# Patient Record
Sex: Female | Born: 1975
Health system: Southern US, Community
[De-identification: ages and names within clinical notes are randomized; demographics above are authoritative.]

## PROBLEM LIST (undated history)

## (undated) DIAGNOSIS — J320 Chronic maxillary sinusitis: Secondary | ICD-10-CM

## (undated) DIAGNOSIS — K219 Gastro-esophageal reflux disease without esophagitis: Secondary | ICD-10-CM

## (undated) DIAGNOSIS — Z973 Presence of spectacles and contact lenses: Secondary | ICD-10-CM

## (undated) DIAGNOSIS — R7303 Prediabetes: Secondary | ICD-10-CM

## (undated) DIAGNOSIS — K589 Irritable bowel syndrome without diarrhea: Secondary | ICD-10-CM

## (undated) DIAGNOSIS — G5602 Carpal tunnel syndrome, left upper limb: Secondary | ICD-10-CM

## (undated) DIAGNOSIS — F419 Anxiety disorder, unspecified: Secondary | ICD-10-CM

## (undated) DIAGNOSIS — Z794 Long term (current) use of insulin: Secondary | ICD-10-CM

## (undated) DIAGNOSIS — Z8489 Family history of other specified conditions: Secondary | ICD-10-CM

## (undated) DIAGNOSIS — R519 Headache, unspecified: Secondary | ICD-10-CM

## (undated) DIAGNOSIS — F329 Major depressive disorder, single episode, unspecified: Secondary | ICD-10-CM

## (undated) DIAGNOSIS — N3946 Mixed incontinence: Secondary | ICD-10-CM

## (undated) DIAGNOSIS — Z8719 Personal history of other diseases of the digestive system: Secondary | ICD-10-CM

## (undated) DIAGNOSIS — E785 Hyperlipidemia, unspecified: Secondary | ICD-10-CM

## (undated) DIAGNOSIS — F32A Depression, unspecified: Secondary | ICD-10-CM

## (undated) DIAGNOSIS — J302 Other seasonal allergic rhinitis: Secondary | ICD-10-CM

## (undated) DIAGNOSIS — C801 Malignant (primary) neoplasm, unspecified: Secondary | ICD-10-CM

## (undated) DIAGNOSIS — S42202A Unspecified fracture of upper end of left humerus, initial encounter for closed fracture: Secondary | ICD-10-CM

## (undated) DIAGNOSIS — I10 Essential (primary) hypertension: Secondary | ICD-10-CM

## (undated) DIAGNOSIS — M1990 Unspecified osteoarthritis, unspecified site: Secondary | ICD-10-CM

## (undated) DIAGNOSIS — E119 Type 2 diabetes mellitus without complications: Secondary | ICD-10-CM

## (undated) HISTORY — DX: Anxiety disorder, unspecified: F41.9

## (undated) HISTORY — DX: Gastro-esophageal reflux disease without esophagitis: K21.9

## (undated) HISTORY — PX: ESOPHAGOGASTRODUODENOSCOPY (EGD) WITH PROPOFOL: SHX5813

## (undated) HISTORY — DX: Essential (primary) hypertension: I10

## (undated) HISTORY — PX: LAPAROSCOPIC CHOLECYSTECTOMY: SUR755

## (undated) HISTORY — PX: ORIF PATELLA FRACTURE: SUR947

## (undated) HISTORY — PX: COLONOSCOPY WITH PROPOFOL: SHX5780

## (undated) HISTORY — PX: CHOLECYSTECTOMY: SHX55

## (undated) HISTORY — PX: WISDOM TOOTH EXTRACTION: SHX21

---

## 2001-03-07 ENCOUNTER — Other Ambulatory Visit: Admission: RE | Admit: 2001-03-07 | Discharge: 2001-03-07 | Payer: Self-pay | Admitting: Obstetrics and Gynecology

## 2007-07-23 ENCOUNTER — Ambulatory Visit (HOSPITAL_COMMUNITY): Admission: RE | Admit: 2007-07-23 | Discharge: 2007-07-23 | Payer: Self-pay | Admitting: Family Medicine

## 2007-07-25 ENCOUNTER — Encounter (HOSPITAL_COMMUNITY): Admission: RE | Admit: 2007-07-25 | Discharge: 2007-08-24 | Payer: Self-pay | Admitting: Family Medicine

## 2007-09-10 ENCOUNTER — Encounter (INDEPENDENT_AMBULATORY_CARE_PROVIDER_SITE_OTHER): Payer: Self-pay | Admitting: General Surgery

## 2007-09-10 ENCOUNTER — Ambulatory Visit (HOSPITAL_COMMUNITY): Admission: RE | Admit: 2007-09-10 | Discharge: 2007-09-10 | Payer: Self-pay | Admitting: General Surgery

## 2007-09-10 HISTORY — PX: CHOLECYSTECTOMY: SHX55

## 2008-06-24 ENCOUNTER — Ambulatory Visit: Payer: Self-pay | Admitting: Internal Medicine

## 2009-08-29 HISTORY — PX: ORIF PATELLA FRACTURE: SUR947

## 2009-10-16 ENCOUNTER — Emergency Department (HOSPITAL_COMMUNITY): Admission: EM | Admit: 2009-10-16 | Discharge: 2009-10-16 | Payer: Self-pay | Admitting: Emergency Medicine

## 2009-12-02 ENCOUNTER — Encounter (HOSPITAL_COMMUNITY): Admission: RE | Admit: 2009-12-02 | Discharge: 2010-01-01 | Payer: Self-pay | Admitting: Orthopedic Surgery

## 2010-03-22 ENCOUNTER — Ambulatory Visit (HOSPITAL_COMMUNITY): Admission: RE | Admit: 2010-03-22 | Discharge: 2010-03-22 | Payer: Self-pay | Admitting: Family Medicine

## 2010-05-24 ENCOUNTER — Ambulatory Visit (HOSPITAL_COMMUNITY): Admission: RE | Admit: 2010-05-24 | Discharge: 2010-05-24 | Payer: Self-pay | Admitting: Family Medicine

## 2010-05-31 ENCOUNTER — Ambulatory Visit (HOSPITAL_COMMUNITY): Admission: RE | Admit: 2010-05-31 | Discharge: 2010-05-31 | Payer: Self-pay | Admitting: Family Medicine

## 2010-09-19 ENCOUNTER — Encounter: Payer: Self-pay | Admitting: Family Medicine

## 2011-01-11 NOTE — Op Note (Signed)
Emily Braun, Emily Braun              ACCOUNT NO.:  [id number]   MEDICAL RECORD NO.:  [id number]          PATIENT TYPE:  AMB   LOCATION:  SDS                          FACILITY:  [facility]   PHYSICIAN:  Gabrielle Dare. Janee Morn, M.D.DATE OF BIRTH:  [birth year]   DATE OF PROCEDURE:  09/10/2007  DATE OF DISCHARGE:  09/10/2007                               OPERATIVE REPORT   PREOPERATIVE DIAGNOSIS:  1. Symptomatic cholelithiasis.  2. Biliary dyskinesia.   POSTOPERATIVE DIAGNOSIS:  1. Symptomatic cholelithiasis.  2. Biliary dyskinesia.   PROCEDURE:  Laparoscopic cholecystectomy.   SURGEON:  Gabrielle Dare. Janee Morn, M.D.   ASSISTANT:  Ollen Gross. Vernell Morgans, M.D.   ANESTHESIA:  General.   INDICATIONS FOR PROCEDURE:  The patient is a 35 year old white female  who I evaluated in the office for epigastric abdominal pain.  Her workup  by Dr. Gerda Diss included ultrasound showing gallstones and HIDA scan  demonstrating severe biliary dyskinesia.  She presents today for  elective cholecystectomy.   DESCRIPTION OF PROCEDURE:  Informed consent was obtained.  The patient  received intravenous antibiotics.  She was identified in the  preoperative holding area.  She was brought to the operating room.  General endotracheal anesthesia was administered by the anesthesia  staff.  Her abdomen was prepped and draped in the usual sterile fashion.  Infraumbilical region was infiltrated with 0.25% Marcaine with  epinephrine.  Infraumbilical incision was made.  Subcutaneous tissues  were dissected down revealing anterior fascia.  Anterior fascia was  divided.  The peritoneal cavity was then entered under direct vision  with careful dissection.  A 0 Vicryl pursestring suture was placed  around the fascial opening.  The Hasson trocar was inserted into the  abdomen and the abdomen was insufflated with carbon dioxide in a  standard fashion under direct vision.  An 11 mm epigastric and two 5 mm  lateral ports were placed.  0.25%  Marcaine with epinephrine was used at  all port sites.  The dome of the gallbladder was retracted superior  medially.  The infundibulum was retracted inferior laterally.  The  gallbladder contained several stones.  Dissection began laterally and  progressed medially easily identifying the cystic duct as well as a  small artery running right next to the cystic duct.  This small artery  was dissected free, clipped once proximally, and divided distally with  cautery.  Further dissection was carried around on the cystic duct until  a large window was created between the cystic duct infundibulum and the  liver.  Once this was done with excellent visualization, a clip was  placed on the infundibulocystic duct junction.  A small nick was made in  the cystic duct and we attempted to insert a Reddick cholangiogram  catheter.  Despite multiple attempts we were unable to insert the  Reddick.  The diameter of the cystic duct seemed approximately  equivalent with the tip of the Reddick catheter and we were not able to  insert it.  Bile flowed back easily and the visualization was excellent.  The patient's liver function tests are normal, so we terminated further  attempts at the cholangiogram.  The three clips were placed on the  proximal and cystic duct and it was divided.  Further dissection  revealed anterior and posterior branches of the cystic artery.  These  were both clipped twice proximally and once distally and divided.  The  gallbladder was taken off the liver bed with Bovie cautery achieving  excellent hemostasis along the way.  We did encounter one other small  arterial branch about halfway up the body of the gallbladder.  This was  clipped twice proximally, once distally, and divided.  The gallbladder  was taken off of the liver bed with the Bovie cautery and placed in an  EndoCatch bag.  It was removed from the abdomen via the infraumbilical  port site.  The liver bed was copiously  irrigated and meticulous  hemostasis was obtained with Bovie cautery.  The clips all remained in  excellent position and the liver bed was dry.  The remainder of the  irrigation fluid was evacuated and it was dry.  The liver bed was  rechecked one more time.  Clips remained in good position and everything  remained dry.  The lateral ports were then removed under direct vision.  The infraumbilical fascia was closed under direct vision with the camera  by tying the 0 Vicryl pursestring suture.  The pneumoperitoneum was  released.  The epigastric port was removed.  All four wounds were  copiously irrigated.  Hemostasis was ensured using Bovie cautery.  The  infraumbilical and epigastric sites were closed with running 4-0 Vicryl  subcuticular stitch and then all four wounds were closed with Dermabond.  Needle, sponge, and instrument counts correct.  The patient tolerated  the procedure well without apparent complications and was taken to the  recovery room in stable condition.      Gabrielle Dare Janee Morn, M.D.  Electronically Signed     BET/MEDQ  D:  09/10/2007  T:  09/10/2007  Job:  [id number]   cc:   Lorin Picket A. Gerda Diss, MD

## 2011-01-11 NOTE — Consult Note (Signed)
Emily Braun, Emily Braun              ACCOUNT NO.:  [id number]   MEDICAL RECORD NO.:  [id number]          PATIENT TYPE:  AMB   LOCATION:  DAY                           FACILITY:  [facility]   PHYSICIAN:  Kassie Mends, M.D.      DATE OF BIRTH:  10-28-1975   DATE OF CONSULTATION:  06/24/2008  DATE OF DISCHARGE:                                 CONSULTATION   REASON FOR CONSULTATION:  Blood in the stool and rectal itching.   PHYSICIAN REQUESTING CONSULTATION:  W. Simone Curia, MD   HISTORY OF PRESENT ILLNESS:  The patient is a very pleasant 35 year old  Caucasian female who presents today for further evaluation of chronic  intermittent hematochezia.  She also has chronic constipation with  intermittent diarrhea as well as rectal irritation.  She states that she  has been seeing bright red blood per rectum primarily on the toilet  tissue for a long time.  She sometimes goes 7 days without a bowel  movement.  Then, she may have 2-3 days in a row with loose stools.  She  really denies any rectal pain, abdominal pain, nausea, or vomiting.  She  has chronic heartburn, controlled with ranitidine.  She denies any  unintentional weight loss.   CURRENT MEDICATIONS:  1. Ranitidine 300 mg daily.  2. Ortho-Tri-Cyclen daily.  3. Tylenol p.r.n.   ALLERGIES:  No known drug allergies.   PAST MEDICAL HISTORY:  GERD.   PAST SURGICAL HISTORY:  She had a cholecystectomy for cholelithiasis in  January 2009.   FAMILY HISTORY:  Father has diabetes.  Both mother and father have  hypertension.  Paternal uncle died with throat cancer.   SOCIAL HISTORY:  She is single.  She is employed with [employer].  She  is a nonsmoker.  Rarely consumes alcohol.   REVIEW OF SYSTEMS:  GI:  See HPI.  CONSTITUTIONAL:  Denies any weight  loss.  CARDIOPULMONARY:  Denies any chest pain, shortness of breath,  palpitations, or cough.  GENITOURINARY:  Denies any dysuria or  hematuria.   PHYSICAL EXAMINATION:  VITAL SIGNS:   Weight 172, height 5 feet 2-1/2  inches, temp 97.9, blood pressure 142/88, and pulse 80.  GENERAL:  Pleasant, moderately obese Caucasian female in no acute  distress.  SKIN:  Warm and dry.  No jaundice.  HEENT:  Sclerae nonicteric.  Oropharyngeal mucosa moist and pink.  No  lesions, erythema, or exudate.  No lymphadenopathy or thyromegaly.  CHEST:  Lungs are clear to auscultation.  CARDIAC:  Regular rate and rhythm.  Normal S1 and S2.  No murmurs, rubs,  or gallops.  ABDOMEN:  Positive bowel sounds.  Abdomen is soft, nontender, and  nondistended.  No organomegaly or masses.  No rebound or guarding.  No  abdominal bruits or hernias.  LOWER EXTREMITIES:  No edema.   LABORATORY DATA:  From May 27, 2008, CBC normal.  She had a  digital rectal exam, which was reported as negative through Dr. Fletcher Anon  office as well.   IMPRESSION:  The patient is a very pleasant 35 year old lady with  chronic constipation with  intermittent diarrhea, chronic intermittent  hematochezia, and rectal irritation.  I suspect, she has irritable bowel  syndrome with possible benign anorectal bleeding from hemorrhoids.  Cannot exclude the possibility of inflammatory bowel disease or  colorectal cancer, however.  Would recommend colonoscopy for further  evaluation.  I have discussed risks, alternatives, and benefits with the  patient with regards to, but not limited to the risk of reaction to  medication, bleeding, infection, and perforation.  She is agreeable to  proceed.   PLAN:  1. Colonoscopy with Dr. Cira Servant in the near future.  2. MiraLax 17 g daily as needed for constipation, #7 samples provided.  3. I would like to thank Dr. Gerda Diss for allowing [place] to take part in      the care of this patient.      [name], P.A.      Kassie Mends, M.D.  Electronically Signed    LL/MEDQ  D:  06/24/2008  T:  06/25/2008  Job:  [id number]   cc:   Donna Bernard, M.D.  Fax: [phone]

## 2011-05-19 LAB — DIFFERENTIAL
Basophils Absolute: 0
Basophils Relative: 0
Eosinophils Absolute: 0.2
Eosinophils Relative: 3
Lymphocytes Relative: 21
Lymphs Abs: 1.3
Monocytes Absolute: 0.4
Monocytes Relative: 6
Neutro Abs: 4.1
Neutrophils Relative %: 69

## 2011-05-19 LAB — HCG, SERUM, QUALITATIVE: Preg, Serum: NEGATIVE

## 2011-05-19 LAB — COMPREHENSIVE METABOLIC PANEL
ALT: 20
AST: 24
Albumin: 3.9
Alkaline Phosphatase: 51
BUN: 10
CO2: 27
Calcium: 9.6
Chloride: 104
Creatinine, Ser: 0.54
GFR calc Af Amer: >60
GFR calc non Af Amer: >60
Glucose, Bld: 106 — ABNORMAL HIGH
Potassium: 4.4
Sodium: 138
Total Bilirubin: 0.4
Total Protein: 7.1

## 2011-05-19 LAB — CBC
HCT: 40.9
Hemoglobin: 13.9
MCHC: 34
MCV: 88.1
Platelets: 341
RBC: 4.64
RDW: 12
WBC: 5.9

## 2012-12-12 ENCOUNTER — Encounter: Payer: Self-pay | Admitting: Family Medicine

## 2012-12-12 ENCOUNTER — Ambulatory Visit (INDEPENDENT_AMBULATORY_CARE_PROVIDER_SITE_OTHER): Payer: BC Managed Care – PPO | Admitting: Family Medicine

## 2012-12-12 VITALS — BP 140/92 | Temp 98.3°F | Ht 63.0 in | Wt 194.4 lb

## 2012-12-12 DIAGNOSIS — J322 Chronic ethmoidal sinusitis: Secondary | ICD-10-CM

## 2012-12-12 MED ORDER — CEFPROZIL 500 MG PO TABS: 500.0000 mg | ORAL_TABLET | Freq: Two times a day (BID) | ORAL | Status: DC

## 2012-12-12 NOTE — Progress Notes (Signed)
  Subjective:    Patient ID: Emily Braun, female    DOB: 03-Aug-1976, 37 y.o.   MRN: [id number]  Sinusitis This is a new problem. The current episode started in the past 7 days. The problem has been rapidly worsening since onset. Her pain is at a severity of 6/10. The pain is moderate. Associated symptoms include congestion, coughing, headaches, a hoarse voice and sinus pressure. Pertinent negatives include no chills. Past treatments include nothing. The treatment provided mild relief.  Headache  Associated symptoms include coughing and sinus pressure.      Review of Systems  Constitutional: Negative for chills.  HENT: Positive for congestion, hoarse voice and sinus pressure.   Respiratory: Positive for cough.   Neurological: Positive for headaches.       Objective:   Physical Exam  Alert. No acute distress. Vitals reviewed. Positive nasal congestion. Frontal and maxillary tenderness. Neck supple. No adenopathy. Lungs clear. Heart regular in rhythm.      Assessment & Plan:  Impression #1 sinusitis. #2 allergic rhinitis-discussed. Plan Cefzil 500 twice a day 10 days. Maintain allergy medications. Symptomatic care discussed. WSL

## 2012-12-20 ENCOUNTER — Other Ambulatory Visit: Payer: Self-pay | Admitting: Family Medicine

## 2012-12-20 ENCOUNTER — Ambulatory Visit (INDEPENDENT_AMBULATORY_CARE_PROVIDER_SITE_OTHER): Payer: BC Managed Care – PPO | Admitting: Family Medicine

## 2012-12-20 ENCOUNTER — Encounter: Payer: Self-pay | Admitting: Family Medicine

## 2012-12-20 VITALS — BP 122/81 | Temp 98.4°F | Wt 193.8 lb

## 2012-12-20 DIAGNOSIS — K529 Noninfective gastroenteritis and colitis, unspecified: Secondary | ICD-10-CM | POA: Insufficient documentation

## 2012-12-20 DIAGNOSIS — K5289 Other specified noninfective gastroenteritis and colitis: Secondary | ICD-10-CM

## 2012-12-20 MED ORDER — METRONIDAZOLE 500 MG PO TABS: 500.0000 mg | ORAL_TABLET | Freq: Three times a day (TID) | ORAL | Status: DC

## 2012-12-20 NOTE — Progress Notes (Signed)
  Subjective:    Patient ID: Emily Braun, female    DOB: 1975/12/01, 37 y.o.   MRN: [id number]  HPI This patient was recently on Cefzil for sinus infection toward the end of it she started noticing soreness pain and discomfort in the left lower artery and then she started noticing loose stools and recently the loose stools have had blood mixed in she states several years ago she was told she ought to have a colonoscopy but did not do it because she didn't have insurance at that time she had blood in her stools no family history of colon cancer. Family history social history all reviewed.   Review of Systemsshe denies fevers vomiting sweats chills     Objective:   Physical Exam Vital signs stable lungs are clear hearts regular moderate left lower quadrant tenderness no guarding or rebound extremities no edema skin warm dry neurologic grossly normal       Assessment & Plan:  Colitis-antibiotic induced  Probable. I doubt colon cancer but several years ago she was advised to get a colonoscopy and did not do because of insurance now with this being the second occurrence of bleeding I believe it is wise for her to go ahead and be seen by gastroenterology for colonoscopy. We will go ahead and treat appropriately to cover for the possibility of C. Difficile toxin/C. Difficile colitis. Warning signs were discussed with her. Flagyl 3 times a day for 7 days, stool testing, probiotic daily for the next 6 weeks. Referral to GI.

## 2012-12-20 NOTE — Patient Instructions (Signed)
[facility] Colon [facility] - one daily for 6 weeks  If high fevers/worse be rechecked right away

## 2012-12-21 ENCOUNTER — Encounter: Payer: Self-pay | Admitting: Gastroenterology

## 2012-12-21 ENCOUNTER — Other Ambulatory Visit: Payer: Self-pay | Admitting: Family Medicine

## 2012-12-21 LAB — CBC WITH DIFFERENTIAL/PLATELET
Eosinophils Relative: 3 % (ref 0–5)
HCT: 41.4 % (ref 36.0–46.0)
Hemoglobin: 14 g/dL (ref 12.0–15.0)
Lymphocytes Relative: 29 % (ref 12–46)
Lymphs Abs: 3 10*3/uL (ref 0.7–4.0)
MCH: 29.2 pg (ref 26.0–34.0)
MCV: 86.4 fL (ref 78.0–100.0)
Monocytes Absolute: 0.7 10*3/uL (ref 0.1–1.0)
Monocytes Relative: 7 % (ref 3–12)
Neutro Abs: 6.4 10*3/uL (ref 1.7–7.7)
Neutrophils Relative %: 60 % (ref 43–77)
Platelets: 381 10*3/uL (ref 150–400)
RBC: 4.79 MIL/uL (ref 3.87–5.11)
RDW: 13.1 % (ref 11.5–15.5)
WBC: 10.4 10*3/uL (ref 4.0–10.5)

## 2012-12-22 LAB — FECAL LACTOFERRIN, QUANT: Lactoferrin: POSITIVE

## 2012-12-25 LAB — STOOL CULTURE

## 2012-12-26 LAB — CLOSTRIDIUM DIFFICILE TOXIN

## 2012-12-26 LAB — CLOSTRIDIUM DIFFICILE BY PCR: Toxigenic C. Difficile by PCR: NOT DETECTED

## 2013-01-04 ENCOUNTER — Encounter: Payer: Self-pay | Admitting: Family Medicine

## 2013-01-09 ENCOUNTER — Encounter: Payer: Self-pay | Admitting: *Deleted

## 2013-01-10 ENCOUNTER — Ambulatory Visit (INDEPENDENT_AMBULATORY_CARE_PROVIDER_SITE_OTHER): Payer: BC Managed Care – PPO | Admitting: Gastroenterology

## 2013-01-10 ENCOUNTER — Other Ambulatory Visit: Payer: BC Managed Care – PPO

## 2013-01-10 ENCOUNTER — Encounter: Payer: Self-pay | Admitting: Gastroenterology

## 2013-01-10 VITALS — BP 110/80 | HR 75 | Ht 62.8 in | Wt 194.0 lb

## 2013-01-10 DIAGNOSIS — R109 Unspecified abdominal pain: Secondary | ICD-10-CM

## 2013-01-10 DIAGNOSIS — K625 Hemorrhage of anus and rectum: Secondary | ICD-10-CM

## 2013-01-10 DIAGNOSIS — R110 Nausea: Secondary | ICD-10-CM

## 2013-01-10 DIAGNOSIS — K589 Irritable bowel syndrome without diarrhea: Secondary | ICD-10-CM

## 2013-01-10 DIAGNOSIS — R197 Diarrhea, unspecified: Secondary | ICD-10-CM

## 2013-01-10 MED ORDER — MOVIPREP 100 G PO SOLR: 1.0000 | Freq: Once | ORAL | Status: DC

## 2013-01-10 NOTE — Patient Instructions (Addendum)
You have been scheduled for a colonoscopy with propofol. Please follow written instructions given to you at your visit today.  Please pick up your prep kit at the pharmacy within the next 1-3 days. If you use inhalers (even only as needed), please bring them with you on the day of your procedure. Your physician has requested that you go to www.startemmi.com and enter the access code given to you at your visit today. This web site gives a general overview about your procedure. However, you should still follow specific instructions given to you by our office regarding your preparation for the procedure.  Your physician has requested that you go to the basement for the following lab work before leaving today: Celiac Panel   Information on Irritable  Bowel Syndrome is below.  FOD MAP Diet was given today for your information. ___________________________________________________________________________________________________________________________________________  Irritable Bowel Syndrome Irritable Bowel Syndrome (IBS) is caused by a disturbance of normal bowel function. Other terms used are spastic colon, mucous colitis, and irritable colon. It does not require surgery, nor does it lead to cancer. There is no cure for IBS. But with proper diet, stress reduction, and medication, you will find that your problems (symptoms) will gradually disappear or improve. IBS is a common digestive disorder. It usually appears in late adolescence or early adulthood. Women develop it twice as often as men. CAUSES  After food has been digested and absorbed in the small intestine, waste material is moved into the colon (large intestine). In the colon, water and salts are absorbed from the undigested products coming from the small intestine. The remaining residue, or fecal material, is held for elimination. Under normal circumstances, gentle, rhythmic contractions on the bowel walls push the fecal material along the colon  towards the rectum. In IBS, however, these contractions are irregular and poorly coordinated. The fecal material is either retained too long, resulting in constipation, or expelled too soon, producing diarrhea. SYMPTOMS  The most common symptom of IBS is pain. It is typically in the lower left side of the belly (abdomen). But it may occur anywhere in the abdomen. It can be felt as heartburn, backache, or even as a dull pain in the arms or shoulders. The pain comes from excessive bowel-muscle spasms and from the buildup of gas and fecal material in the colon. This pain:  Can range from sharp belly (abdominal) cramps to a dull, continuous ache.  Usually worsens soon after eating.  Is typically relieved by having a bowel movement or passing gas. Abdominal pain is usually accompanied by constipation. But it may also produce diarrhea. The diarrhea typically occurs right after a meal or upon arising in the morning. The stools are typically soft and watery. They are often flecked with secretions (mucus). Other symptoms of IBS include:  Bloating.  Loss of appetite.  Heartburn.  Feeling sick to your stomach (nausea).  Belching  Vomiting  Gas. IBS may also cause a number of symptoms that are unrelated to the digestive system:  Fatigue.  Headaches.  Anxiety  Shortness of breath  Difficulty in concentrating.  Dizziness. These symptoms tend to come and go. DIAGNOSIS  The symptoms of IBS closely mimic the symptoms of other, more serious digestive disorders. So your caregiver may wish to perform a variety of additional tests to exclude these disorders. He/she wants to be certain of learning what is wrong (diagnosis). The nature and purpose of each test will be explained to you. TREATMENT A number of medications are available to help  correct bowel function and/or relieve bowel spasms and abdominal pain. Among the drugs available are:  Mild, non-irritating laxatives for severe constipation  and to help restore normal bowel habits.  Specific anti-diarrheal medications to treat severe or prolonged diarrhea.  Anti-spasmodic agents to relieve intestinal cramps.  Your caregiver may also decide to treat you with a mild tranquilizer or sedative during unusually stressful periods in your life. The important thing to remember is that if any drug is prescribed for you, make sure that you take it exactly as directed. Make sure that your caregiver knows how well it worked for you. HOME CARE INSTRUCTIONS   Avoid foods that are high in fat or oils. Some examples ZOX:WRUEA cream, butter, frankfurters, sausage, and other fatty meats.  Avoid foods that have a laxative effect, such as fruit, fruit juice, and dairy products.  Cut out carbonated drinks, chewing gum, and "gassy" foods, such as beans and cabbage. This may help relieve bloating and belching.  Bran taken with plenty of liquids may help relieve constipation.  Keep track of what foods seem to trigger your symptoms.  Avoid emotionally charged situations or circumstances that produce anxiety.  Start or continue exercising.  Get plenty of rest and sleep. MAKE SURE YOU:   Understand these instructions.  Will watch your condition.  Will get help right away if you are not doing well or get worse. Document Released: 08/15/2005 Document Revised: 11/07/2011 Document Reviewed: 04/04/2008 [facility] Patient Information 2013 [place], [place].

## 2013-01-10 NOTE — Progress Notes (Signed)
History of Present Illness:  This is a 37 year old Caucasian female with chronic IBS, one to 2 loose bowel movements a day, who recently had antibiotic induced worsening of her diarrhea, and responded well to by mouth metronidazole by Dr. Gerda Diss .  She is back to usual 1-2 soft bowel movements a day but does have periodic bright red blood per rectum, and relates that she has" hemorrhoids and Preparation H is my friend".  The patient has not had previous colonoscopy or barium studies.  She denies upper GI or hepatobiliary complaints except for occasional GERD managed with Zantac 300 mg.  There is no history of food intolerances, anorexia, weight loss, hepatobiliary or systemic symptoms.  The patient did have laparoscopic cholecystectomy in 2009.  She currently is on probiotics with some improvement of her chronic IBS symptoms.  He denies lactose intolerance, history of sorbitol or fructose use.  I have reviewed this patient's present history, medical and surgical past history, allergies and medications.     ROS:   All systems were reviewed and are negative unless otherwise stated in the HPI.    Physical Exam: Blood pressure 110/80, 75 and regular, and weight 194 the BMI of 34.59. General well developed well nourished patient in no acute distress, appearing their stated age Eyes PERRLA, no icterus, fundoscopic exam per opthamologist Skin no lesions noted Neck supple, no adenopathy, no thyroid enlargement, no tenderness Chest clear to percussion and auscultation Heart no significant murmurs, gallops or rubs noted Abdomen no hepatosplenomegaly masses or tenderness, BS normal.  Rectal inspection normal no fissures, or fistulae noted.  No masses or tenderness on digital exam. Stool guaiac negative. Extremities no acute joint lesions, edema, phlebitis or evidence of cellulitis. Neurologic patient oriented x 3, cranial nerves intact, no focal neurologic deficits noted. Psychological mental status normal  and normal affect.  Assessment and plan: Chronic IBS with periodic rectal bleeding probably from mixed hemorrhoids, rule out colonic polyposis, IBD, or rectal prolapse.  I have placed her on a FOD-MAP diet for diarrhea predominant IBS, we will check celiac profile, and I've scheduled diagnostic colonoscopy at her convenience. Encounter Diagnoses  Name Primary?  . Rectal bleeding Yes  . Nausea alone   . Abdominal pain, unspecified site

## 2013-01-11 ENCOUNTER — Ambulatory Visit: Payer: BC Managed Care – PPO

## 2013-01-16 ENCOUNTER — Ambulatory Visit (INDEPENDENT_AMBULATORY_CARE_PROVIDER_SITE_OTHER): Payer: BC Managed Care – PPO | Admitting: *Deleted

## 2013-01-16 DIAGNOSIS — IMO0001 Reserved for inherently not codable concepts without codable children: Secondary | ICD-10-CM

## 2013-01-16 DIAGNOSIS — Z309 Encounter for contraceptive management, unspecified: Secondary | ICD-10-CM

## 2013-01-16 MED ORDER — MEDROXYPROGESTERONE ACETATE 150 MG/ML IM SUSP
150.0000 mg | Freq: Once | INTRAMUSCULAR | Status: AC
  Administered 2013-01-16: 150 mg via INTRAMUSCULAR

## 2013-01-16 NOTE — Progress Notes (Signed)
  Subjective:    Patient ID: Emily Braun, female    DOB: [birth year], 37 y.o.   MRN: [id number]  HPI    Review of Systems     Objective:   Physical Exam        Assessment & Plan:

## 2013-01-18 ENCOUNTER — Ambulatory Visit: Payer: BC Managed Care – PPO

## 2013-01-19 LAB — CELIAC PANEL 10
Endomysial Screen: NEGATIVE
Gliadin IgA: 9.7 U/mL (ref <20)
Tissue Transglut Ab: 7.3 U/mL (ref <20)

## 2013-02-01 ENCOUNTER — Encounter: Payer: BC Managed Care – PPO | Admitting: Gastroenterology

## 2013-02-22 ENCOUNTER — Ambulatory Visit (AMBULATORY_SURGERY_CENTER): Payer: BC Managed Care – PPO | Admitting: Gastroenterology

## 2013-02-22 ENCOUNTER — Encounter: Payer: Self-pay | Admitting: Gastroenterology

## 2013-02-22 VITALS — BP 106/68 | HR 65 | Temp 98.9°F | Resp 18 | Ht 62.0 in | Wt 194.0 lb

## 2013-02-22 DIAGNOSIS — Z1211 Encounter for screening for malignant neoplasm of colon: Secondary | ICD-10-CM

## 2013-02-22 DIAGNOSIS — R197 Diarrhea, unspecified: Secondary | ICD-10-CM

## 2013-02-22 DIAGNOSIS — K625 Hemorrhage of anus and rectum: Secondary | ICD-10-CM

## 2013-02-22 HISTORY — PX: COLONOSCOPY WITH PROPOFOL: SHX5780

## 2013-02-22 MED ORDER — SODIUM CHLORIDE 0.9 % IV SOLN: 500.0000 mL | INTRAVENOUS | Status: DC

## 2013-02-22 NOTE — Progress Notes (Signed)
Procedure ends, to recovery, report give, VSS

## 2013-02-22 NOTE — Op Note (Signed)
[facility] [address] [place], [address]   COLONOSCOPY PROCEDURE REPORT  PATIENT: Emily Braun, Emily Braun  MR#: [id number] BIRTHDATE: July 09, 1976 , 36  yrs. old GENDER: Female ENDOSCOPIST: Mardella Layman, MD, Clementeen Graham REFERRED BY:  Lilyan Punt, M.D. PROCEDURE DATE:  02/22/2013 PROCEDURE:   Colonoscopy with biopsy ASA CLASS:   Class II INDICATIONS:Average risk patient for colon cancer and hematochezia.  MEDICATIONS: propofol (Diprivan) 250mg  IV  DESCRIPTION OF PROCEDURE:   After the risks and benefits and of the procedure were explained, informed consent was obtained.        The LB [id number]  endoscope was introduced through the anus and advanced to the cecum, which was identified by both the appendix and ileocecal valve .  The quality of the prep was excellent, using MoviPrep .  The instrument was then slowly withdrawn as the colon was fully examined.     COLON FINDINGS: A normal appearing cecum, ileocecal valve, and appendiceal orifice were identified.  The ascending, hepatic flexure, transverse, splenic flexure, descending, sigmoid colon and rectum appeared unremarkable.  No polyps or cancers were seen. Retroflexed views revealed external hemorrhoids.     The scope was then withdrawn from the patient and the procedure completed.  COMPLICATIONS: There were no complications. ENDOSCOPIC IMPRESSION: Normal colon ..random biopsies done,small hemorrhoids noted.  RECOMMENDATIONS: 1.  Await biopsy results 2.  Continue current medications 3.  Continue current colorectal screening recommendations for "routine risk" patients with a repeat colonoscopy in 10 years.   REPEAT EXAM:  cc:  _______________________________ eSignedMardella Layman, MD, [facility] 02/22/2013 2:21 PM

## 2013-02-22 NOTE — Progress Notes (Signed)
Patient did not experience any of the following events: a burn prior to discharge; a fall within the facility; wrong site/side/patient/procedure/implant event; or a hospital transfer or hospital admission upon discharge from the facility. (G8907) Patient did not have preoperative order for IV antibiotic SSI prophylaxis. (G8918)  

## 2013-02-22 NOTE — Progress Notes (Signed)
Called to room to assist during endoscopic procedure.  Patient ID and intended procedure confirmed with present staff. Received instructions for my participation in the procedure from the performing physician.  

## 2013-02-22 NOTE — Patient Instructions (Addendum)
Discharge instructions given with verbal understanding. Handouts on hemorrhoids and a high fiber diet given. Resume previous medications. YOU HAD AN ENDOSCOPIC PROCEDURE TODAY AT THE [facility]: Refer to the procedure report that was given to you for any specific questions about what was found during the examination.  If the procedure report does not answer your questions, please call your gastroenterologist to clarify.  If you requested that your care partner not be given the details of your procedure findings, then the procedure report has been included in a sealed envelope for you to review at your convenience later.  YOU SHOULD EXPECT: Some feelings of bloating in the abdomen. Passage of more gas than usual.  Walking can help get rid of the air that was put into your GI tract during the procedure and reduce the bloating. If you had a lower endoscopy (such as a colonoscopy or flexible sigmoidoscopy) you may notice spotting of blood in your stool or on the toilet paper. If you underwent a bowel prep for your procedure, then you may not have a normal bowel movement for a few days.  DIET: Your first meal following the procedure should be a light meal and then it is ok to progress to your normal diet.  A half-sandwich or bowl of soup is an example of a good first meal.  Heavy or fried foods are harder to digest and may make you feel nauseous or bloated.  Likewise meals heavy in dairy and vegetables can cause extra gas to form and this can also increase the bloating.  Drink plenty of fluids but you should avoid alcoholic beverages for 24 hours.  ACTIVITY: Your care partner should take you home directly after the procedure.  You should plan to take it easy, moving slowly for the rest of the day.  You can resume normal activity the day after the procedure however you should NOT DRIVE or use heavy machinery for 24 hours (because of the sedation medicines used during the test).    SYMPTOMS TO  REPORT IMMEDIATELY: A gastroenterologist can be reached at any hour.  During normal business hours, 8:30 AM to 5:00 PM Monday through Friday, call [phone].  After hours and on weekends, please call the GI answering service at [phone] who will take a message and have the physician on call contact you.   Following lower endoscopy (colonoscopy or flexible sigmoidoscopy):  Excessive amounts of blood in the stool  Significant tenderness or worsening of abdominal pains  Swelling of the abdomen that is new, acute  Fever of 100F or higher FOLLOW UP: If any biopsies were taken you will be contacted by phone or by letter within the next 1-3 weeks.  Call your gastroenterologist if you have not heard about the biopsies in 3 weeks.  Our staff will call the home number listed on your records the next business day following your procedure to check on you and address any questions or concerns that you may have at that time regarding the information given to you following your procedure. This is a courtesy call and so if there is no answer at the home number and we have not heard from you through the emergency physician on call, we will assume that you have returned to your regular daily activities without incident.  SIGNATURES/CONFIDENTIALITY: You and/or your care partner have signed paperwork which will be entered into your electronic medical record.  These signatures attest to the fact that that the information above on your   After Visit Summary has been reviewed and is understood.  Full responsibility of the confidentiality of this discharge information lies with you and/or your care-partner.  

## 2013-02-25 ENCOUNTER — Telehealth: Payer: Self-pay

## 2013-02-25 NOTE — Telephone Encounter (Signed)
  Follow up Call-  Call back number 02/22/2013  Post procedure Call Back phone  # [phone]  Permission to leave phone message Yes     Patient questions:  Do you have a fever, pain , or abdominal swelling? no Pain Score  0 *  Have you tolerated food without any problems? yes  Have you been able to return to your normal activities? yes  Do you have any questions about your discharge instructions: Diet   no Medications  no Follow up visit  no  Do you have questions or concerns about your Care? no  Actions: * If pain score is 4 or above: No action needed, pain <4.

## 2013-02-26 ENCOUNTER — Encounter: Payer: Self-pay | Admitting: Gastroenterology

## 2013-03-14 ENCOUNTER — Other Ambulatory Visit: Payer: Self-pay | Admitting: Family Medicine

## 2013-04-05 ENCOUNTER — Ambulatory Visit: Payer: BC Managed Care – PPO

## 2013-04-09 ENCOUNTER — Ambulatory Visit (INDEPENDENT_AMBULATORY_CARE_PROVIDER_SITE_OTHER): Payer: BC Managed Care – PPO | Admitting: *Deleted

## 2013-04-09 DIAGNOSIS — Z309 Encounter for contraceptive management, unspecified: Secondary | ICD-10-CM

## 2013-04-09 DIAGNOSIS — IMO0001 Reserved for inherently not codable concepts without codable children: Secondary | ICD-10-CM

## 2013-04-09 MED ORDER — MEDROXYPROGESTERONE ACETATE 150 MG/ML IM SUSP
150.0000 mg | Freq: Once | INTRAMUSCULAR | Status: AC
  Administered 2013-04-09: 150 mg via INTRAMUSCULAR

## 2013-04-10 ENCOUNTER — Other Ambulatory Visit: Payer: Self-pay | Admitting: Family Medicine

## 2013-05-06 ENCOUNTER — Other Ambulatory Visit: Payer: Self-pay | Admitting: Nurse Practitioner

## 2013-06-14 ENCOUNTER — Encounter: Payer: Self-pay | Admitting: Family Medicine

## 2013-06-14 ENCOUNTER — Ambulatory Visit (INDEPENDENT_AMBULATORY_CARE_PROVIDER_SITE_OTHER): Payer: BC Managed Care – PPO | Admitting: Family Medicine

## 2013-06-14 VITALS — BP 118/94 | Ht 62.0 in | Wt 196.2 lb

## 2013-06-14 DIAGNOSIS — R300 Dysuria: Secondary | ICD-10-CM

## 2013-06-14 LAB — POCT URINALYSIS DIPSTICK
Protein, UA: 100
Spec Grav, UA: >=1.03

## 2013-06-14 MED ORDER — CIPROFLOXACIN HCL 500 MG PO TABS: 500.0000 mg | ORAL_TABLET | Freq: Two times a day (BID) | ORAL | Status: DC

## 2013-06-14 NOTE — Progress Notes (Signed)
  Subjective:    Patient ID: Emily Braun, female    DOB: April 06, 1976, 37 y.o.   MRN: [id number]  Dysuria  This is a new problem. The current episode started yesterday. The quality of the pain is described as burning. Associated symptoms include frequency.   This patient has had some problems in the past urinary tract infections. There is been no vomiting wheezing difficulty breathing. There is some slight flank discomfort on the right side. None on the left side. No diarrhea. No hematochezia. PMH frequent urinary tract infections in the past. In addition to this has been on Macrobid in the past and has seen Dr. Orvilla Fus of [facility].  Review of Systems  Genitourinary: Positive for dysuria and frequency.       Objective:   Physical Exam  Lungs are clear hearts regular flanks nontender slight tenderness on the right flank region with palpation abdomen soft Urinalysis with wbc's UTI present      Assessment & Plan:  UTI-Cipro 500 mg twice a day over the course of next 5 days. If doing well most likely can stop. Patient last UTI was approximately 3 months ago. She stated that she will probably set up an appointment with urologist. I told her that would be fine. Warning signs were discussed

## 2013-06-19 ENCOUNTER — Other Ambulatory Visit: Payer: Self-pay | Admitting: Family Medicine

## 2013-06-28 ENCOUNTER — Ambulatory Visit: Payer: BC Managed Care – PPO

## 2013-06-28 ENCOUNTER — Encounter: Payer: Self-pay | Admitting: Nurse Practitioner

## 2013-06-28 ENCOUNTER — Ambulatory Visit (INDEPENDENT_AMBULATORY_CARE_PROVIDER_SITE_OTHER): Payer: BC Managed Care – PPO | Admitting: Nurse Practitioner

## 2013-06-28 VITALS — BP 122/78 | Ht 62.0 in | Wt 198.0 lb

## 2013-06-28 DIAGNOSIS — R7301 Impaired fasting glucose: Secondary | ICD-10-CM

## 2013-06-28 DIAGNOSIS — R635 Abnormal weight gain: Secondary | ICD-10-CM

## 2013-06-28 DIAGNOSIS — R638 Other symptoms and signs concerning food and fluid intake: Secondary | ICD-10-CM

## 2013-06-28 DIAGNOSIS — T50905A Adverse effect of unspecified drugs, medicaments and biological substances, initial encounter: Secondary | ICD-10-CM

## 2013-06-28 DIAGNOSIS — I10 Essential (primary) hypertension: Secondary | ICD-10-CM

## 2013-06-28 MED ORDER — LEVONORGEST-ETH ESTRAD 91-DAY 0.15-0.03 &0.01 MG PO TABS: 1.0000 | ORAL_TABLET | Freq: Every day | ORAL | Status: DC

## 2013-06-28 MED ORDER — PHENTERMINE HCL 37.5 MG PO TABS: 37.5000 mg | ORAL_TABLET | Freq: Every day | ORAL | Status: DC

## 2013-07-03 ENCOUNTER — Encounter: Payer: Self-pay | Admitting: Nurse Practitioner

## 2013-07-03 DIAGNOSIS — R7301 Impaired fasting glucose: Secondary | ICD-10-CM | POA: Insufficient documentation

## 2013-07-03 DIAGNOSIS — I10 Essential (primary) hypertension: Secondary | ICD-10-CM | POA: Insufficient documentation

## 2013-07-03 NOTE — Progress Notes (Signed)
Subjective:   Presents for c/o weight gain while on Depo Provera. Her weight was in 160s now 198. Wants to switch to oc's.   Objective:   BP 122/78  Ht 5\' 2"  (1.575 m)  Wt 198 lb (89.812 kg)  BMI 36.21 kg/m2 NAD. Alert, oriented. Lungs clear. Heart RRR. No murmur or gallop noted.   Assessment: Weight gain due to medication  Essential hypertension, benign  Impaired fasting glucose  Plan: Stop Depo Provera. This Sunday, start Seasonique as directed.  Meds ordered this encounter  Medications  . Levonorgestrel-Ethinyl Estradiol (AMETHIA,CAMRESE) 0.15-0.03 &0.01 MG tablet    Sig: Take 1 tablet by mouth daily.    Dispense:  1 Package    Refill:  4    Order Specific Question:  Supervising Provider    Answer:  Merlyn Albert [[id number]]  . phentermine (ADIPEX-P) 37.5 MG tablet    Sig: Take 1 tablet (37.5 mg total) by mouth daily before breakfast.    Dispense:  30 tablet    Refill:  1    Order Specific Question:  Supervising Provider    Answer:  Merlyn Albert [[id number]]   Start Phentermine 1/2-1 tab qd. Stop if BP above 140/90. Followup in December at [facility], sooner if any problems.

## 2013-07-04 ENCOUNTER — Other Ambulatory Visit: Payer: Self-pay

## 2013-07-18 ENCOUNTER — Other Ambulatory Visit: Payer: Self-pay | Admitting: Family Medicine

## 2013-08-09 ENCOUNTER — Encounter: Payer: Self-pay | Admitting: Nurse Practitioner

## 2013-08-09 ENCOUNTER — Ambulatory Visit (INDEPENDENT_AMBULATORY_CARE_PROVIDER_SITE_OTHER): Payer: BC Managed Care – PPO | Admitting: Nurse Practitioner

## 2013-08-09 VITALS — BP 120/72 | Ht 62.0 in | Wt 191.8 lb

## 2013-08-09 DIAGNOSIS — R7301 Impaired fasting glucose: Secondary | ICD-10-CM

## 2013-08-09 DIAGNOSIS — I10 Essential (primary) hypertension: Secondary | ICD-10-CM

## 2013-08-09 DIAGNOSIS — Z01419 Encounter for gynecological examination (general) (routine) without abnormal findings: Secondary | ICD-10-CM

## 2013-08-09 DIAGNOSIS — Z0000 Encounter for general adult medical examination without abnormal findings: Secondary | ICD-10-CM

## 2013-08-09 LAB — BASIC METABOLIC PANEL WITH GFR
BUN: 11 mg/dL (ref 6–23)
CO2: 29 meq/L (ref 19–32)
Calcium: 9.5 mg/dL (ref 8.4–10.5)
Chloride: 101 meq/L (ref 96–112)
Creat: 0.77 mg/dL (ref 0.50–1.10)
Glucose, Bld: 107 mg/dL — ABNORMAL HIGH (ref 70–99)
Potassium: 4.8 meq/L (ref 3.5–5.3)
Sodium: 139 meq/L (ref 135–145)

## 2013-08-09 LAB — LIPID PANEL: VLDL: 9 mg/dL (ref 0–40)

## 2013-08-09 LAB — HEMOGLOBIN A1C
Hgb A1c MFr Bld: 5.6 %
Mean Plasma Glucose: 114 mg/dL

## 2013-08-09 LAB — HEPATIC FUNCTION PANEL
ALT: 19 U/L (ref 0–35)
AST: 27 U/L (ref 0–37)
Indirect Bilirubin: 0.4 mg/dL (ref 0.0–0.9)
Total Bilirubin: 0.5 mg/dL (ref 0.3–1.2)
Total Protein: 7.1 g/dL (ref 6.0–8.3)

## 2013-08-09 MED ORDER — PHENTERMINE HCL 37.5 MG PO TABS: 37.5000 mg | ORAL_TABLET | Freq: Every day | ORAL | Status: DC

## 2013-08-12 ENCOUNTER — Encounter: Payer: Self-pay | Admitting: Nurse Practitioner

## 2013-08-14 ENCOUNTER — Encounter: Payer: Self-pay | Admitting: Nurse Practitioner

## 2013-08-14 NOTE — Progress Notes (Signed)
   Subjective:    Patient ID: Emily Braun, female    DOB: 1976-03-07, 37 y.o.   MRN: [id number]  HPI presents for wellness exam. Since switching to her birth control pills, combined with phentermine has lost 7-8 pounds. Cycles are light and regular lasting 3-4 days. Same sexual partner. No vaginal discharge or pelvic pain. Regular eye exams. Needs a dental exam. Denies any side effects from phentermine.    Review of Systems  Constitutional: Negative for activity change, appetite change and fatigue.  HENT: Positive for rhinorrhea. Negative for dental problem, ear pain, sinus pressure and sore throat.   Eyes: Negative for visual disturbance.  Respiratory: Negative for choking, chest tightness, shortness of breath and wheezing.   Cardiovascular: Negative for chest pain and leg swelling.  Gastrointestinal: Negative for nausea, vomiting, abdominal pain, diarrhea and constipation.  Genitourinary: Negative for dysuria, urgency, frequency, vaginal discharge, difficulty urinating, menstrual problem and pelvic pain.       Objective:   Physical Exam  Vitals reviewed. Constitutional: She is oriented to person, place, and time. She appears well-developed. No distress.  HENT:  Right Ear: External ear normal.  Left Ear: External ear normal.  Mouth/Throat: Oropharynx is clear and moist.  Neck: Normal range of motion. Neck supple. No tracheal deviation present. No thyromegaly present.  Cardiovascular: Normal rate, regular rhythm and normal heart sounds.  Exam reveals no gallop.   No murmur heard. Pulmonary/Chest: Effort normal and breath sounds normal.  Abdominal: Soft. She exhibits no distension. There is no tenderness.  Genitourinary: Vagina normal and uterus normal. No vaginal discharge found.  Musculoskeletal: She exhibits no edema.  Lymphadenopathy:    She has no cervical adenopathy.  Neurological: She is alert and oriented to person, place, and time.  Skin: Skin is warm and dry. No rash  noted.  Psychiatric: She has a normal mood and affect. Her behavior is normal.   Breast exam: No masses noted, axilla no adenopathy. External GU normal. Vagina normal, no discharge noted. Cervix normal limit in appearance, no CMT. Bimanual exam normal, no masses or tenderness noted.        Assessment & Plan:  Well woman exam  Impaired fasting glucose - Plan: Basic metabolic panel, Hemoglobin A1c, Hepatic function panel  Essential hypertension, benign - Plan: Basic metabolic panel, Hepatic function panel  Routine general medical examination at a health care facility - Plan: Hepatic function panel, Lipid panel  Meds ordered this encounter  Medications  . phentermine (ADIPEX-P) 37.5 MG tablet    Sig: Take 1 tablet (37.5 mg total) by mouth daily before breakfast.    Dispense:  30 tablet    Refill:  2    Order Specific Question:  Supervising Provider    Answer:  Merlyn Albert [[id number]]   Continue exercise and weight loss efforts. Recommend daily vitamin D and calcium supplementation. Recheck in 3 months, call back sooner if any problems. Next physical in one year.

## 2013-08-17 ENCOUNTER — Encounter: Payer: Self-pay | Admitting: Nurse Practitioner

## 2013-09-09 ENCOUNTER — Encounter: Payer: Self-pay | Admitting: Family Medicine

## 2013-09-09 ENCOUNTER — Ambulatory Visit: Payer: BC Managed Care – PPO | Admitting: Nurse Practitioner

## 2013-09-09 ENCOUNTER — Ambulatory Visit (INDEPENDENT_AMBULATORY_CARE_PROVIDER_SITE_OTHER): Payer: BC Managed Care – PPO | Admitting: Family Medicine

## 2013-09-09 VITALS — BP 134/94 | Temp 98.4°F | Ht 62.0 in | Wt 191.8 lb

## 2013-09-09 DIAGNOSIS — R300 Dysuria: Secondary | ICD-10-CM

## 2013-09-09 LAB — POCT URINALYSIS DIPSTICK
PH UA: 5
SPEC GRAV UA: 1.02

## 2013-09-09 MED ORDER — CIPROFLOXACIN HCL 500 MG PO TABS: 500.0000 mg | ORAL_TABLET | Freq: Two times a day (BID) | ORAL | Status: DC

## 2013-09-09 NOTE — Progress Notes (Signed)
   Subjective:    Patient ID: Emily Braun, female    DOB: 21-May-1976, 38 y.o.   MRN: [id number]  HPI Comments: She took OTC meds for the cold, and now has a UTI. She said it happens all the time.  Urinary Tract Infection  This is a recurrent problem. The current episode started in the past 7 days. The problem occurs intermittently. The quality of the pain is described as burning. There has been no fever. Associated symptoms include flank pain and hematuria. Associated symptoms comments: Back pain yesterday.   She states she actually had a little bit hematuria 2 separate times last week. She does see a urologist.  Review of Systems  Genitourinary: Positive for hematuria and flank pain.   she has had some urinary frequency     Objective:   Physical Exam Flanks are nontender abdomen is soft urine under microscope looks good       Assessment & Plan:  Cover with Cipro check urine culture if negative referral to urology for cystoscope

## 2013-09-11 LAB — URINE CULTURE: Colony Count: 75000

## 2013-09-20 ENCOUNTER — Encounter: Payer: Self-pay | Admitting: Family Medicine

## 2013-09-20 ENCOUNTER — Ambulatory Visit (INDEPENDENT_AMBULATORY_CARE_PROVIDER_SITE_OTHER): Payer: BC Managed Care – PPO | Admitting: Family Medicine

## 2013-09-20 VITALS — BP 110/80 | Ht 62.0 in | Wt 193.4 lb

## 2013-09-20 DIAGNOSIS — R300 Dysuria: Secondary | ICD-10-CM

## 2013-09-20 LAB — POCT URINALYSIS DIPSTICK
Spec Grav, UA: 1.01
pH, UA: 6

## 2013-09-20 NOTE — Progress Notes (Signed)
   Subjective:    Patient ID: Emily Braun, female    DOB: 1976-04-30, 38 y.o.   MRN: [id number]  HPI Patient is here to have her urine rechecked. States she is having no symptoms. Recently urinalysis showed contaminated urine  Review of Systems Denies hematuria denies dysuria     Objective:   Physical Exam  Lungs clear heart regular  Urinalysis normal no blood cells    Assessment & Plan:  Reculture urine no sign of any current infection

## 2013-09-23 LAB — URINALYSIS, ROUTINE W REFLEX MICROSCOPIC
Bilirubin Urine: NEGATIVE
Glucose, UA: NEGATIVE mg/dL
Hgb urine dipstick: NEGATIVE
Ketones, ur: NEGATIVE mg/dL
Leukocytes, UA: NEGATIVE
NITRITE: NEGATIVE
PH: 6.5 (ref 5.0–8.0)
PROTEIN: NEGATIVE mg/dL
Specific Gravity, Urine: 1.021 (ref 1.005–1.030)
Urobilinogen, UA: 0.2 mg/dL (ref 0.0–1.0)

## 2013-09-27 ENCOUNTER — Ambulatory Visit: Payer: BC Managed Care – PPO | Admitting: Family Medicine

## 2013-10-11 ENCOUNTER — Ambulatory Visit (INDEPENDENT_AMBULATORY_CARE_PROVIDER_SITE_OTHER): Payer: BC Managed Care – PPO | Admitting: Family Medicine

## 2013-10-11 ENCOUNTER — Encounter: Payer: Self-pay | Admitting: Family Medicine

## 2013-10-11 ENCOUNTER — Institutional Professional Consult (permissible substitution): Payer: Self-pay | Admitting: Emergency Medicine

## 2013-10-11 VITALS — BP 128/86 | Temp 98.5°F | Ht 62.0 in | Wt 192.0 lb

## 2013-10-11 DIAGNOSIS — J019 Acute sinusitis, unspecified: Secondary | ICD-10-CM

## 2013-10-11 MED ORDER — CEFPROZIL 500 MG PO TABS: 500.0000 mg | ORAL_TABLET | Freq: Two times a day (BID) | ORAL | Status: AC

## 2013-10-11 NOTE — Progress Notes (Signed)
   Subjective:    Patient ID: Emily Braun, female    DOB: [birth year], 38 y.o.   MRN: [id number]  Sore Throat  This is a new problem. The current episode started yesterday. Associated symptoms include congestion, coughing and ear pain. Pertinent negatives include no shortness of breath. She has tried acetaminophen for the symptoms. The treatment provided mild relief.  has had congestion for several weeksd, using the humidifier Clear to bloody to mucous tint. Symptoms off and on for 2 months    Review of Systems  Constitutional: Negative for fever and activity change.  HENT: Positive for congestion, ear pain and rhinorrhea.   Eyes: Negative for discharge.  Respiratory: Positive for cough. Negative for shortness of breath and wheezing.   Cardiovascular: Negative for chest pain.  heard herself wheeze No fever      Objective:   Physical Exam  Nursing note and vitals reviewed. Constitutional: She appears well-developed.  HENT:  Head: Normocephalic.  Nose: Nose normal.  Mouth/Throat: Oropharynx is clear and moist. No oropharyngeal exudate.  Neck: Neck supple.  Cardiovascular: Normal rate and normal heart sounds.   No murmur heard. Pulmonary/Chest: Effort normal and breath sounds normal. She has no wheezes.  Lymphadenopathy:    She has no cervical adenopathy.  Skin: Skin is warm and dry.          Assessment & Plan:  Patient has some element of chronic sinus congestion and drainage but now over the past few weeks with mucoid drainage I feel it's a little to go ahead and treat with antibiotics. I find no evidence of any pulmonary involvement should gradually get back to her usual self over the next few days if progressive troubles or if worse she needs to notify us. She understands this.

## 2013-10-16 ENCOUNTER — Telehealth: Payer: Self-pay | Admitting: Family Medicine

## 2013-10-16 NOTE — Telephone Encounter (Signed)
Pt put on cefPROZIL (CEFZIL) 500 MG tablet For her sinus infection.  Has a bladder infection now, wants to  Know if she should Cont with the cefzil or switch to the cipro she has?

## 2013-10-16 NOTE — Telephone Encounter (Signed)
May add cipro 500 one bid for 5 days, may stop Cefzil after the 20th

## 2013-10-17 MED ORDER — CIPROFLOXACIN HCL 500 MG PO TABS: 500.0000 mg | ORAL_TABLET | Freq: Two times a day (BID) | ORAL | Status: DC

## 2013-10-17 NOTE — Telephone Encounter (Signed)
Medication sent to pharmacy. Patient was notified.  

## 2013-11-12 ENCOUNTER — Other Ambulatory Visit: Payer: Self-pay | Admitting: Family Medicine

## 2013-11-15 ENCOUNTER — Ambulatory Visit: Payer: BC Managed Care – PPO | Admitting: Nurse Practitioner

## 2013-11-29 ENCOUNTER — Ambulatory Visit (INDEPENDENT_AMBULATORY_CARE_PROVIDER_SITE_OTHER): Payer: BC Managed Care – PPO | Admitting: Nurse Practitioner

## 2013-11-29 ENCOUNTER — Encounter: Payer: Self-pay | Admitting: Nurse Practitioner

## 2013-11-29 VITALS — BP 128/88 | Ht 62.0 in | Wt 195.0 lb

## 2013-11-29 DIAGNOSIS — J0110 Acute frontal sinusitis, unspecified: Secondary | ICD-10-CM

## 2013-11-29 MED ORDER — METHYLPREDNISOLONE ACETATE 40 MG/ML IJ SUSP
40.0000 mg | Freq: Once | INTRAMUSCULAR | Status: AC
  Administered 2013-11-29: 40 mg via INTRAMUSCULAR

## 2013-12-02 ENCOUNTER — Telehealth: Payer: Self-pay | Admitting: Family Medicine

## 2013-12-02 ENCOUNTER — Encounter: Payer: Self-pay | Admitting: Nurse Practitioner

## 2013-12-02 MED ORDER — LEVOFLOXACIN 500 MG PO TABS
500.0000 mg | ORAL_TABLET | Freq: Every day | ORAL | Status: DC
Start: 2013-12-02 — End: 2013-12-27

## 2013-12-02 NOTE — Telephone Encounter (Signed)
May send in prescription for Levaquin one daily for the next 10 days 500 mg. If not well after that followup.

## 2013-12-02 NOTE — Progress Notes (Signed)
Subjective:  Presents for recheck. Over the past several weeks has had sinus congestion. Now having frontal area headache. Clear to slightly green mucus. What feels like your drainage from the right ear. No ear pain. No sore throat. Rare cough. No wheezing. Currently taking Allegra and Flonase for her symptoms. Has about a week's worth of Cefzil at home.  Objective:   BP 128/88  Ht 5\' 2"  (1.575 m)  Wt 195 lb (88.451 kg)  BMI 35.66 kg/m2 NAD. Alert, oriented. TMs significant clear effusion, no erythema. No drainage or perforation noted in the right ear. Pharynx injected with PND noted. Neck supple with mild soft interior adenopathy. Lungs clear. Heart regular rate rhythm.  Assessment:Acute frontal sinusitis - Plan: methylPREDNISolone acetate (DEPO-MEDROL) injection 40 mg  Plan: Meds ordered this encounter  Medications  . methylPREDNISolone acetate (DEPO-MEDROL) injection 40 mg    Sig:    restart Cefzil as directed. Call back next week if no improvement, sooner if worse.

## 2013-12-02 NOTE — Telephone Encounter (Signed)
Seen on 11/29/13. Office note not ready. Diagnosis acute frontal sinusitis. No meds were sent in for pt. she Received depo medrol shot and pt told carolyn she had cefprozil at home and carolyn told pt to use that. Still having stuffy nose, ear pain, headache, No fever.

## 2013-12-02 NOTE — Telephone Encounter (Signed)
Patient was told to call back today if she was feeling no better than she was on Friday. She is still having all the same symptoms she was on Friday with not much relief. Please advise.   [facility] 

## 2013-12-02 NOTE — Telephone Encounter (Signed)
Med sent to pharm. Pt notified.  

## 2013-12-09 ENCOUNTER — Telehealth: Payer: Self-pay | Admitting: Family Medicine

## 2013-12-09 NOTE — Telephone Encounter (Signed)
Patient is still having stuffyness and muffles in her ear. She said Emily Braun had mentioned a steroid if she wasn't feeling any better. Please advise.  [facility] [place]

## 2013-12-10 ENCOUNTER — Other Ambulatory Visit: Payer: Self-pay | Admitting: Nurse Practitioner

## 2013-12-10 MED ORDER — PREDNISONE 20 MG PO TABS: ORAL_TABLET | ORAL | Status: DC

## 2013-12-24 ENCOUNTER — Telehealth: Payer: Self-pay | Admitting: Family Medicine

## 2013-12-24 MED ORDER — FLUTICASONE PROPIONATE 50 MCG/ACT NA SUSP: 2.0000 | Freq: Every day | NASAL | Status: DC

## 2013-12-24 MED ORDER — AMOXICILLIN-POT CLAVULANATE 875-125 MG PO TABS: 1.0000 | ORAL_TABLET | Freq: Two times a day (BID) | ORAL | Status: DC

## 2013-12-24 NOTE — Telephone Encounter (Signed)
Patient notified and verbalized understanding. 

## 2013-12-24 NOTE — Telephone Encounter (Signed)
Seen on 11/29/13. Diagnosis acute frontal sinusitis.  She received depo medrol shot and pt told carolyn she had cefprozil at home and carolyn told pt to use that. Still having stuffy nose, ear pain, headache, No fever. Was given Levaquin and just finished Prednisone taper. Still having same symptoms. Wants to know what the next steps are.

## 2013-12-24 NOTE — Telephone Encounter (Signed)
flonase 2 sprays each nare daily, 6 rf. Also augmentin 875 bid 10 days with snack f/u if ongoing

## 2013-12-24 NOTE — Addendum Note (Signed)
Addended byCharolotte Capuchin D on: 12/24/2013 10:04 AM   Modules accepted: Orders

## 2013-12-27 ENCOUNTER — Encounter: Payer: Self-pay | Admitting: Family Medicine

## 2013-12-27 ENCOUNTER — Ambulatory Visit (INDEPENDENT_AMBULATORY_CARE_PROVIDER_SITE_OTHER): Payer: BC Managed Care – PPO | Admitting: Family Medicine

## 2013-12-27 VITALS — BP 110/80 | Temp 98.0°F | Ht 62.0 in | Wt 197.2 lb

## 2013-12-27 DIAGNOSIS — J310 Chronic rhinitis: Secondary | ICD-10-CM

## 2013-12-27 DIAGNOSIS — J329 Chronic sinusitis, unspecified: Secondary | ICD-10-CM

## 2013-12-27 MED ORDER — AMOXICILLIN-POT CLAVULANATE 875-125 MG PO TABS: 1.0000 | ORAL_TABLET | Freq: Two times a day (BID) | ORAL | Status: DC

## 2013-12-27 NOTE — Progress Notes (Signed)
   Subjective:    Patient ID: Emily Braun, female    DOB: [birth year], 38 y.o.   MRN: [id number]  Sinusitis This is a recurrent problem. The current episode started more than 1 month ago. The problem has been gradually worsening since onset. There has been no fever. Her pain is at a severity of 5/10. The pain is moderate. Associated symptoms include congestion, coughing, sinus pressure and a sore throat. Past treatments include antibiotics. The treatment provided no relief.  Patient states she has no other concerns at this time.   cef didn't help, took round of lev and pred, Started on stickw with allergy meds  Bad max pain and frontal pain  Throat very sore  Mouth breathing a lot,  Pain with swallwoing  Usually rough spring with bronchitis as a child but none recently  No sig allergy  Was on gen claritin and now back to instead of allegra  Review of Systems  HENT: Positive for congestion, sinus pressure and sore throat.   Respiratory: Positive for cough.    No vomiting or diarrhea    Objective:   Physical Exam    Alert moderate malaise   lungs clear. Heart regular in rhythm. H&T frontal and maxillary tenderness. Assessment & Plan:  Impression 1 acute rhinosinusitis #2 allergic rhinitis plan Augmentin twice a day 10 days. Symptomatic care discussed. Warning signs discussed. WSL

## 2014-01-09 ENCOUNTER — Encounter: Payer: Self-pay | Admitting: Family Medicine

## 2014-01-09 ENCOUNTER — Ambulatory Visit (INDEPENDENT_AMBULATORY_CARE_PROVIDER_SITE_OTHER): Payer: BC Managed Care – PPO | Admitting: Family Medicine

## 2014-01-09 VITALS — BP 110/78 | Temp 98.2°F | Ht 62.0 in | Wt 200.0 lb

## 2014-01-09 DIAGNOSIS — N9489 Other specified conditions associated with female genital organs and menstrual cycle: Secondary | ICD-10-CM

## 2014-01-09 DIAGNOSIS — N949 Unspecified condition associated with female genital organs and menstrual cycle: Secondary | ICD-10-CM

## 2014-01-09 DIAGNOSIS — J019 Acute sinusitis, unspecified: Secondary | ICD-10-CM

## 2014-01-09 LAB — POCT URINALYSIS DIPSTICK
Spec Grav, UA: 1.02
pH, UA: 6

## 2014-01-09 MED ORDER — FLUCONAZOLE 150 MG PO TABS: ORAL_TABLET | ORAL | Status: DC

## 2014-01-09 MED ORDER — CEFDINIR 300 MG PO CAPS: 300.0000 mg | ORAL_CAPSULE | Freq: Two times a day (BID) | ORAL | Status: DC

## 2014-01-09 NOTE — Progress Notes (Signed)
   Subjective:    Patient ID: Emily Braun, female    DOB: [birth year], 38 y.o.   MRN: [id number]  Sinusitis This is a new problem. The current episode started 1 to 4 weeks ago. The problem is unchanged. There has been no fever. The pain is moderate. Associated symptoms include congestion, coughing and a sore throat. Pertinent negatives include no ear pain or shortness of breath. Past treatments include antibiotics. The treatment provided no relief.   Patient is now having vaginal itching and burning. Possible yeast due to recent antibiotic use.    Review of Systems  Constitutional: Negative for fever and activity change.  HENT: Positive for congestion, rhinorrhea and sore throat. Negative for ear pain.   Eyes: Negative for discharge.  Respiratory: Positive for cough. Negative for shortness of breath and wheezing.   Cardiovascular: Negative for chest pain.       Objective:   Physical Exam  Nursing note and vitals reviewed. Constitutional: She appears well-developed.  HENT:  Head: Normocephalic.  Nose: Nose normal.  Mouth/Throat: Oropharynx is clear and moist. No oropharyngeal exudate.  Sinusitis tenderness  Neck: Neck supple.  Cardiovascular: Normal rate and normal heart sounds.   No murmur heard. Pulmonary/Chest: Effort normal and breath sounds normal. She has no wheezes.  Lymphadenopathy:    She has no cervical adenopathy.  Skin: Skin is warm and dry.          Assessment & Plan:  #1 sinusitis persistent complex needs ENT referral so that they can do a fiberoptic evaluation of the sinus openings possibly may end up needing to have balloon surgery. Continue antibiotics need antibiotic prescribed #2 yeast infection Diflucan as directed warning signs discussed followup troubles

## 2014-01-17 ENCOUNTER — Other Ambulatory Visit: Payer: Self-pay | Admitting: Family Medicine

## 2014-02-06 ENCOUNTER — Ambulatory Visit (INDEPENDENT_AMBULATORY_CARE_PROVIDER_SITE_OTHER): Payer: BC Managed Care – PPO | Admitting: Otolaryngology

## 2014-02-06 DIAGNOSIS — J320 Chronic maxillary sinusitis: Secondary | ICD-10-CM

## 2014-02-06 DIAGNOSIS — J330 Polyp of nasal cavity: Secondary | ICD-10-CM

## 2014-02-11 ENCOUNTER — Other Ambulatory Visit (INDEPENDENT_AMBULATORY_CARE_PROVIDER_SITE_OTHER): Payer: Self-pay | Admitting: Otolaryngology

## 2014-02-11 DIAGNOSIS — J339 Nasal polyp, unspecified: Secondary | ICD-10-CM

## 2014-02-13 ENCOUNTER — Ambulatory Visit (HOSPITAL_COMMUNITY)
Admission: RE | Admit: 2014-02-13 | Discharge: 2014-02-13 | Disposition: A | Discharge status: Home / Self Care | Payer: BC Managed Care – PPO | Source: Ambulatory Visit | Attending: Otolaryngology | Admitting: Otolaryngology

## 2014-02-13 DIAGNOSIS — J339 Nasal polyp, unspecified: Secondary | ICD-10-CM

## 2014-02-16 ENCOUNTER — Other Ambulatory Visit: Payer: Self-pay | Admitting: Family Medicine

## 2014-02-26 ENCOUNTER — Other Ambulatory Visit: Payer: Self-pay | Admitting: Otolaryngology

## 2014-03-04 ENCOUNTER — Encounter (HOSPITAL_BASED_OUTPATIENT_CLINIC_OR_DEPARTMENT_OTHER): Payer: Self-pay | Admitting: *Deleted

## 2014-03-04 NOTE — Pre-Procedure Instructions (Signed)
Pt. will fax current EKG to [place]; will have BMET done at [facility]

## 2014-03-05 ENCOUNTER — Encounter (HOSPITAL_COMMUNITY)
Admission: RE | Admit: 2014-03-05 | Discharge: 2014-03-05 | Disposition: A | Discharge status: Home / Self Care | Payer: BC Managed Care – PPO | Source: Ambulatory Visit | Attending: Otolaryngology | Admitting: Otolaryngology

## 2014-03-05 DIAGNOSIS — Z01812 Encounter for preprocedural laboratory examination: Secondary | ICD-10-CM | POA: Insufficient documentation

## 2014-03-05 LAB — BASIC METABOLIC PANEL
Anion gap: 13 (ref 5–15)
BUN: 8 mg/dL (ref 6–23)
CALCIUM: 9 mg/dL (ref 8.4–10.5)
CO2: 25 mEq/L (ref 19–32)
Chloride: 101 mEq/L (ref 96–112)
Creatinine, Ser: 0.57 mg/dL (ref 0.50–1.10)
GFR calc Af Amer: >90 mL/min (ref >90)
GFR calc non Af Amer: >90 mL/min (ref >90)
GLUCOSE: 126 mg/dL — AB (ref 70–99)
Potassium: 4.1 mEq/L (ref 3.7–5.3)
SODIUM: 139 meq/L (ref 137–147)

## 2014-03-07 ENCOUNTER — Ambulatory Visit (INDEPENDENT_AMBULATORY_CARE_PROVIDER_SITE_OTHER): Payer: BC Managed Care – PPO | Admitting: Nurse Practitioner

## 2014-03-07 ENCOUNTER — Encounter: Payer: Self-pay | Admitting: Nurse Practitioner

## 2014-03-07 VITALS — BP 134/90 | Temp 98.6°F | Ht 62.0 in | Wt 201.0 lb

## 2014-03-07 DIAGNOSIS — L568 Other specified acute skin changes due to ultraviolet radiation: Secondary | ICD-10-CM

## 2014-03-07 MED ORDER — CLOBETASOL PROPIONATE 0.05 % EX CREA: 1.0000 "application " | TOPICAL_CREAM | Freq: Two times a day (BID) | CUTANEOUS | Status: DC

## 2014-03-07 NOTE — Patient Instructions (Signed)
Loratadine 10 mg in the morning Benadryl 25 mg at night

## 2014-03-10 ENCOUNTER — Encounter (HOSPITAL_BASED_OUTPATIENT_CLINIC_OR_DEPARTMENT_OTHER)
Admission: RE | Disposition: A | Discharge status: Home / Self Care | Payer: Self-pay | Source: Ambulatory Visit | Attending: Otolaryngology

## 2014-03-10 ENCOUNTER — Ambulatory Visit (HOSPITAL_BASED_OUTPATIENT_CLINIC_OR_DEPARTMENT_OTHER): Payer: BC Managed Care – PPO | Admitting: Anesthesiology

## 2014-03-10 ENCOUNTER — Encounter (HOSPITAL_BASED_OUTPATIENT_CLINIC_OR_DEPARTMENT_OTHER): Payer: Self-pay

## 2014-03-10 ENCOUNTER — Ambulatory Visit (HOSPITAL_BASED_OUTPATIENT_CLINIC_OR_DEPARTMENT_OTHER)
Admission: RE | Admit: 2014-03-10 | Discharge: 2014-03-10 | Disposition: A | Discharge status: Home / Self Care | Payer: BC Managed Care – PPO | Source: Ambulatory Visit | Attending: Otolaryngology | Admitting: Otolaryngology

## 2014-03-10 ENCOUNTER — Encounter (HOSPITAL_BASED_OUTPATIENT_CLINIC_OR_DEPARTMENT_OTHER): Payer: BC Managed Care – PPO | Admitting: Anesthesiology

## 2014-03-10 DIAGNOSIS — J330 Polyp of nasal cavity: Secondary | ICD-10-CM | POA: Insufficient documentation

## 2014-03-10 DIAGNOSIS — K219 Gastro-esophageal reflux disease without esophagitis: Secondary | ICD-10-CM | POA: Insufficient documentation

## 2014-03-10 DIAGNOSIS — F329 Major depressive disorder, single episode, unspecified: Secondary | ICD-10-CM | POA: Insufficient documentation

## 2014-03-10 DIAGNOSIS — F411 Generalized anxiety disorder: Secondary | ICD-10-CM | POA: Insufficient documentation

## 2014-03-10 DIAGNOSIS — J338 Other polyp of sinus: Secondary | ICD-10-CM | POA: Insufficient documentation

## 2014-03-10 DIAGNOSIS — F3289 Other specified depressive episodes: Secondary | ICD-10-CM | POA: Insufficient documentation

## 2014-03-10 DIAGNOSIS — J320 Chronic maxillary sinusitis: Secondary | ICD-10-CM | POA: Insufficient documentation

## 2014-03-10 DIAGNOSIS — I10 Essential (primary) hypertension: Secondary | ICD-10-CM | POA: Insufficient documentation

## 2014-03-10 HISTORY — DX: Other seasonal allergic rhinitis: J30.2

## 2014-03-10 HISTORY — DX: Depression, unspecified: F32.A

## 2014-03-10 HISTORY — DX: Major depressive disorder, single episode, unspecified: F32.9

## 2014-03-10 HISTORY — DX: Chronic maxillary sinusitis: J32.0

## 2014-03-10 HISTORY — PX: MAXILLARY ANTROSTOMY: SHX2003

## 2014-03-10 LAB — POCT HEMOGLOBIN-HEMACUE: Hemoglobin: 13.7 g/dL (ref 12.0–15.0)

## 2014-03-10 SURGERY — MAXILLARY ANTROSTOMY
Anesthesia: General | Site: Nose | Laterality: Left

## 2014-03-10 MED ORDER — MIDAZOLAM HCL 2 MG/2ML IJ SOLN: 1.0000 mg | INTRAMUSCULAR | Status: DC | PRN

## 2014-03-10 MED ORDER — MUPIROCIN 2 % EX OINT
TOPICAL_OINTMENT | CUTANEOUS | Status: AC
  Filled 2014-03-10: qty 22

## 2014-03-10 MED ORDER — HYDROCODONE-ACETAMINOPHEN 5-325 MG PO TABS: 1.0000 | ORAL_TABLET | ORAL | Status: DC | PRN

## 2014-03-10 MED ORDER — OXYMETAZOLINE HCL 0.05 % NA SOLN
NASAL | Status: DC | PRN
  Administered 2014-03-10: 1 via NASAL

## 2014-03-10 MED ORDER — FENTANYL CITRATE 0.05 MG/ML IJ SOLN
INTRAMUSCULAR | Status: DC | PRN
  Administered 2014-03-10: 50 ug via INTRAVENOUS
  Administered 2014-03-10: 100 ug via INTRAVENOUS
  Administered 2014-03-10: 50 ug via INTRAVENOUS

## 2014-03-10 MED ORDER — LACTATED RINGERS IV SOLN
INTRAVENOUS | Status: DC
  Administered 2014-03-10 (×2): via INTRAVENOUS

## 2014-03-10 MED ORDER — OXYCODONE HCL 5 MG PO TABS: 5.0000 mg | ORAL_TABLET | Freq: Once | ORAL | Status: DC | PRN

## 2014-03-10 MED ORDER — OXYMETAZOLINE HCL 0.05 % NA SOLN
NASAL | Status: AC
  Filled 2014-03-10: qty 15

## 2014-03-10 MED ORDER — PROPOFOL 10 MG/ML IV BOLUS
INTRAVENOUS | Status: AC
  Filled 2014-03-10: qty 20

## 2014-03-10 MED ORDER — CEFAZOLIN SODIUM-DEXTROSE 2-3 GM-% IV SOLR
INTRAVENOUS | Status: DC | PRN
  Administered 2014-03-10: 2 g via INTRAVENOUS

## 2014-03-10 MED ORDER — FENTANYL CITRATE 0.05 MG/ML IJ SOLN: 50.0000 ug | INTRAMUSCULAR | Status: DC | PRN

## 2014-03-10 MED ORDER — MIDAZOLAM HCL 5 MG/5ML IJ SOLN
INTRAMUSCULAR | Status: DC | PRN
  Administered 2014-03-10: 2 mg via INTRAVENOUS

## 2014-03-10 MED ORDER — MIDAZOLAM HCL 2 MG/ML PO SYRP: 12.0000 mg | ORAL_SOLUTION | Freq: Once | ORAL | Status: DC | PRN

## 2014-03-10 MED ORDER — SUCCINYLCHOLINE CHLORIDE 20 MG/ML IJ SOLN
INTRAMUSCULAR | Status: DC | PRN
  Administered 2014-03-10: 100 mg via INTRAVENOUS

## 2014-03-10 MED ORDER — PROPOFOL 10 MG/ML IV BOLUS
INTRAVENOUS | Status: DC | PRN
  Administered 2014-03-10: 150 mg via INTRAVENOUS

## 2014-03-10 MED ORDER — HYDROMORPHONE HCL PF 1 MG/ML IJ SOLN: 0.2500 mg | INTRAMUSCULAR | Status: DC | PRN

## 2014-03-10 MED ORDER — BACITRACIN ZINC 500 UNIT/GM EX OINT
TOPICAL_OINTMENT | CUTANEOUS | Status: AC
  Filled 2014-03-10: qty 28.35

## 2014-03-10 MED ORDER — DEXAMETHASONE SODIUM PHOSPHATE 4 MG/ML IJ SOLN
INTRAMUSCULAR | Status: DC | PRN
  Administered 2014-03-10: 10 mg via INTRAVENOUS

## 2014-03-10 MED ORDER — CIPROFLOXACIN-HYDROCORTISONE 0.2-1 % OT SUSP
OTIC | Status: AC
Start: 2014-03-10 — End: 2014-03-10
  Filled 2014-03-10: qty 10

## 2014-03-10 MED ORDER — AMOXICILLIN 875 MG PO TABS: 875.0000 mg | ORAL_TABLET | Freq: Two times a day (BID) | ORAL | Status: DC

## 2014-03-10 MED ORDER — OXYCODONE HCL 5 MG/5ML PO SOLN
5.0000 mg | Freq: Once | ORAL | Status: DC | PRN
Start: 2014-03-10 — End: 2014-03-10

## 2014-03-10 MED ORDER — LIDOCAINE HCL (CARDIAC) 20 MG/ML IV SOLN
INTRAVENOUS | Status: DC | PRN
  Administered 2014-03-10: 60 mg via INTRAVENOUS

## 2014-03-10 MED ORDER — MUPIROCIN 2 % EX OINT
TOPICAL_OINTMENT | CUTANEOUS | Status: DC | PRN
  Administered 2014-03-10: 1 via NASAL

## 2014-03-10 MED ORDER — MIDAZOLAM HCL 2 MG/2ML IJ SOLN
INTRAMUSCULAR | Status: AC
  Filled 2014-03-10: qty 2

## 2014-03-10 MED ORDER — ONDANSETRON HCL 4 MG/2ML IJ SOLN
INTRAMUSCULAR | Status: DC | PRN
  Administered 2014-03-10: 4 mg via INTRAVENOUS

## 2014-03-10 MED ORDER — LIDOCAINE-EPINEPHRINE 1 %-1:100000 IJ SOLN
INTRAMUSCULAR | Status: AC
  Filled 2014-03-10: qty 1

## 2014-03-10 MED ORDER — CIPROFLOXACIN-DEXAMETHASONE 0.3-0.1 % OT SUSP
OTIC | Status: AC
  Filled 2014-03-10: qty 7.5

## 2014-03-10 MED ORDER — FENTANYL CITRATE 0.05 MG/ML IJ SOLN
INTRAMUSCULAR | Status: AC
  Filled 2014-03-10: qty 6

## 2014-03-10 SURGICAL SUPPLY — 44 items
ATTRACTOMAT 16X20 MAGNETIC DRP (DRAPES) IMPLANT
BLADE RAD40 ROTATE 4M 4 5PK (BLADE) IMPLANT
BLADE RAD60 ROTATE M4 4 5PK (BLADE) IMPLANT
BLADE TRICUT ROTATE M4 4 5PK (BLADE) IMPLANT
BUR HS RAD FRONTAL 3 (BURR) IMPLANT
CANISTER SUC SOCK COL 7IN (MISCELLANEOUS) ×2 IMPLANT
CANISTER SUCT 1200ML W/VALVE (MISCELLANEOUS) ×2 IMPLANT
COAGULATOR SUCT 8FR VV (MISCELLANEOUS) ×1 IMPLANT
COAGULATOR SUCT SWTCH 10FR 6 (ELECTROSURGICAL) IMPLANT
DECANTER SPIKE VIAL GLASS SM (MISCELLANEOUS) IMPLANT
DRSG NASAL KENNEDY LMNT 8CM (GAUZE/BANDAGES/DRESSINGS) IMPLANT
DRSG NASOPORE 8CM (GAUZE/BANDAGES/DRESSINGS) ×1 IMPLANT
DRSG TELFA 3X8 NADH (GAUZE/BANDAGES/DRESSINGS) IMPLANT
ELECT REM PT RETURN 9FT ADLT (ELECTROSURGICAL)
ELECTRODE REM PT RTRN 9FT ADLT (ELECTROSURGICAL) IMPLANT
GAUZE SPONGE 4X4 12PLY STRL (GAUZE/BANDAGES/DRESSINGS) ×1 IMPLANT
GLOVE BIO SURGEON STRL SZ7.5 (GLOVE) ×2 IMPLANT
GLOVE SURG SS PI 7.0 STRL IVOR (GLOVE) ×1 IMPLANT
GOWN STRL REUS W/ TWL LRG LVL3 (GOWN DISPOSABLE) ×2 IMPLANT
GOWN STRL REUS W/TWL LRG LVL3 (GOWN DISPOSABLE) ×4
HEMOSTAT SURGICEL 2X14 (HEMOSTASIS) IMPLANT
IV NS 500ML (IV SOLUTION)
IV NS 500ML BAXH (IV SOLUTION) ×1 IMPLANT
NDL HYPO 25X1 1.5 SAFETY (NEEDLE) ×1 IMPLANT
NDL SPNL 25GX3.5 QUINCKE BL (NEEDLE) IMPLANT
NEEDLE HYPO 25X1 1.5 SAFETY (NEEDLE) ×2 IMPLANT
NEEDLE SPNL 25GX3.5 QUINCKE BL (NEEDLE) IMPLANT
NS IRRIG 1000ML POUR BTL (IV SOLUTION) IMPLANT
PACK BASIN DAY SURGERY FS (CUSTOM PROCEDURE TRAY) ×2 IMPLANT
PACK ENT DAY SURGERY (CUSTOM PROCEDURE TRAY) ×2 IMPLANT
PAD DRESSING TELFA 3X8 NADH (GAUZE/BANDAGES/DRESSINGS) IMPLANT
SET EXT MALE ROTATING LL 32IN (MISCELLANEOUS) IMPLANT
SET IV EXT TUBING FEMALE 31 (MISCELLANEOUS) IMPLANT
SLEEVE SCD COMPRESS KNEE MED (MISCELLANEOUS) ×1 IMPLANT
SOLUTION BUTLER CLEAR DIP (MISCELLANEOUS) ×3 IMPLANT
SPLINT NASAL DOYLE BI-VL (GAUZE/BANDAGES/DRESSINGS) IMPLANT
SPONGE GAUZE 2X2 8PLY STRL LF (GAUZE/BANDAGES/DRESSINGS) ×2 IMPLANT
SPONGE NEURO XRAY DETECT 1X3 (DISPOSABLE) ×2 IMPLANT
SUT ETHILON 3 0 PS 1 (SUTURE) IMPLANT
SUT PLAIN 4 0 ~~LOC~~ 1 (SUTURE) IMPLANT
TOWEL OR 17X24 6PK STRL BLUE (TOWEL DISPOSABLE) ×2 IMPLANT
TUBE CONNECTING 20X1/4 (TUBING) ×2 IMPLANT
TUBE SALEM SUMP 16 FR W/ARV (TUBING) IMPLANT
YANKAUER SUCT BULB TIP NO VENT (SUCTIONS) IMPLANT

## 2014-03-10 NOTE — Anesthesia Postprocedure Evaluation (Signed)
  Anesthesia Post-op Note  Patient: Emily Braun  Procedure(s) Performed: Procedure(s): LEFT MAXILLARY ANTROSTOMY (Left)  Patient Location: PACU  Anesthesia Type:General  Level of Consciousness: awake and alert   Airway and Oxygen Therapy: Patient Spontanous Breathing  Post-op Pain: mild  Post-op Assessment: Post-op Vital signs reviewed, Patient's Cardiovascular Status Stable and Respiratory Function Stable  Post-op Vital Signs: Reviewed  Filed Vitals:   03/10/14 1215  BP: 147/83  Pulse: 94  Temp:   Resp: 17    Complications: No apparent anesthesia complications

## 2014-03-10 NOTE — Anesthesia Preprocedure Evaluation (Addendum)
Anesthesia Evaluation  Patient identified by MRN, date of birth, ID band Patient awake    Reviewed: Allergy & Precautions, H&P , NPO status , Patient's Chart, lab work & pertinent test results  Airway Mallampati: II TM Distance: >3 FB Neck ROM: Full    Dental no notable dental hx. (+) Teeth Intact, Dental Advisory Given   Pulmonary neg pulmonary ROS,  breath sounds clear to auscultation  Pulmonary exam normal       Cardiovascular hypertension, Pt. on medications Rhythm:Regular Rate:Normal     Neuro/Psych Anxiety Depression negative neurological ROS     GI/Hepatic Neg liver ROS, GERD-  Medicated,  Endo/Other  negative endocrine ROS  Renal/GU negative Renal ROS  negative genitourinary   Musculoskeletal   Abdominal   Peds  Hematology negative hematology ROS (+)   Anesthesia Other Findings   Reproductive/Obstetrics negative OB ROS                          Anesthesia Physical Anesthesia Plan  ASA: II  Anesthesia Plan: General   Post-op Pain Management:    Induction: Intravenous  Airway Management Planned: Oral ETT  Additional Equipment:   Intra-op Plan:   Post-operative Plan: Extubation in OR  Informed Consent: I have reviewed the patients History and Physical, chart, labs and discussed the procedure including the risks, benefits and alternatives for the proposed anesthesia with the patient or authorized representative who has indicated his/her understanding and acceptance.   Dental advisory given  Plan Discussed with: CRNA  Anesthesia Plan Comments:         Anesthesia Quick Evaluation

## 2014-03-10 NOTE — Brief Op Note (Signed)
03/10/2014  12:06 PM  PATIENT:  Emelia Salisbury  38 y.o. female  PRE-OPERATIVE DIAGNOSIS:  Left maxillary polyp, chronic left maxillary sinusitis  POST-OPERATIVE DIAGNOSIS:  Left maxillary polyp, chronic left maxillary sinusitis  PROCEDURE:  Procedure(s): LEFT MAXILLARY ANTROSTOMY WITH POLYP REMOVAL  SURGEON:  Surgeon(s) and Role:    * Sui W Brendyn Mclaren, MD - Primary  PHYSICIAN ASSISTANT:   ASSISTANTS: none   ANESTHESIA:   general  EBL:  Total I/O In: 800 [I.V.:800] Out: -   BLOOD ADMINISTERED:none  DRAINS: none   LOCAL MEDICATIONS USED:  NONE  SPECIMEN:  Source of Specimen:  Left maxillary polyp  DISPOSITION OF SPECIMEN:  PATHOLOGY  COUNTS:  YES  TOURNIQUET:  * No tourniquets in log *  DICTATION: .Other Dictation: Dictation Number [id number]  PLAN OF CARE: Discharge to home after PACU  PATIENT DISPOSITION:  PACU - hemodynamically stable.   Delay start of Pharmacological VTE agent (>24hrs) due to surgical blood loss or risk of bleeding: not applicable

## 2014-03-10 NOTE — Transfer of Care (Signed)
Immediate Anesthesia Transfer of Care Note  Patient: Emily Braun  Procedure(s) Performed: Procedure(s): LEFT MAXILLARY ANTROSTOMY (Left)  Patient Location: PACU  Anesthesia Type:General  Level of Consciousness: awake, alert  and oriented  Airway & Oxygen Therapy: Patient Spontanous Breathing and Patient connected to face mask oxygen  Post-op Assessment: Report given to PACU RN and Post -op Vital signs reviewed and stable  Post vital signs: Reviewed and stable  Complications: No apparent anesthesia complications

## 2014-03-10 NOTE — Discharge Instructions (Addendum)

## 2014-03-10 NOTE — H&P (Signed)
Cc: Left sided sinusitis, polyposis  HPI: The patient is a 38 year old female who returns today complaining of persistent left facial pressure and left-sided nasal obstruction.  The patient was previously noted to have a large left nasal polyp.  She was previously treated with 7 different courses of antibiotics and 3 different  courses of prednisone taper. However, she continues to be symptomatic.  She underwent a sinus CT scan.  The CT showed complete opacification of her left maxillary sinus.  Low to intermediate density soft tissue was noted to extend from the left maxillary sinus into the nasal cavity, expanding the maxillary sinus ostium.  The soft tissue density also extends posteriorly in the nasal cavity into the nasopharynx. The findings are most consistent with large antrochoanal polyp.  The patient returns reporting minimal improvement in her symptoms, despite extensive medical treatment.  She is interested in more definitive treatment of her condition.   Exam (nasal endoscopy): Description: Risks, benefits, and alternatives of flexible endoscopy were explained to the patient.  Specific mention was made of the risk of throat numbness with difficulty swallowing, possible bleeding from the nose and mouth, and pain from the procedure.  The patient gave oral consent to proceed.  The nasal cavities were decongested and anesthetised with a combination of oxymetazoline and 4% lidocaine solution.  The flexible scope was inserted into the right nasal cavity.  Endoscopy of the inferior and middle meatus was performed.  Edematous mucosa was noted.  No polyp, mass, or lesion was appreciated.  Olfactory cleft was clear.  Nasopharynx was clear.  Turbinates were hypertrophied but without mass.   The procedure was repeated on the contralateral side. The patient continues to have a large polyp within the left nasal cavity emanating from the middle meatus.  No purulent drainage.  The patient tolerated the procedure  well.  Instructions were given to avoid eating or drinking for 2 hours.    Assessment: The patient is noted to have a persistent large left antrochoanal polyp.  The polyp is noted to completely obstruct the left maxillary ostium.  The polyp extends into the left posterior nasal cavity and the nasopharynx.    Plan: 1.  The nasal endoscopy findings and CT images are reviewed with the patient.   2.  In light of the above findings, the patient will likely benefit from surgical removal of her left antrochoanal polyp. The procedure can be performed endoscopically under general anesthesia.  The risks, benefits, alternatives and details of the procedure are reviewed with the patient. Questions are invited and answered.  3.  The patient would like to proceed with the procedure.

## 2014-03-10 NOTE — Anesthesia Procedure Notes (Signed)
Procedure Name: Intubation Date/Time: 03/10/2014 11:22 AM Performed by: Maryella Shivers Pre-anesthesia Checklist: Patient identified, Emergency Drugs available, Suction available and Patient being monitored Patient Re-evaluated:Patient Re-evaluated prior to inductionOxygen Delivery Method: Circle System Utilized Preoxygenation: Pre-oxygenation with 100% oxygen Intubation Type: IV induction Ventilation: Mask ventilation without difficulty Laryngoscope Size: Mac and 3 Grade View: Grade II Tube type: Oral Tube size: 7.0 mm Number of attempts: 1 Airway Equipment and Method: stylet and oral airway Placement Confirmation: ETT inserted through vocal cords under direct vision,  positive ETCO2 and breath sounds checked- equal and bilateral Secured at: 21 cm Tube secured with: Tape Dental Injury: Teeth and Oropharynx as per pre-operative assessment

## 2014-03-11 ENCOUNTER — Encounter (HOSPITAL_BASED_OUTPATIENT_CLINIC_OR_DEPARTMENT_OTHER): Payer: Self-pay | Admitting: Otolaryngology

## 2014-03-11 NOTE — Op Note (Signed)
Emily Braun, Emily Braun              ACCOUNT NO.:  [id number]  MEDICAL RECORD NO.:  [id number]  LOCATION:                                 FACILITY:  PHYSICIAN:  Leta Baptist, MD            DATE OF BIRTH:  06-Jun-1976  DATE OF PROCEDURE:  03/10/2014 DATE OF DISCHARGE:  03/10/2014                              OPERATIVE REPORT   SURGEON:  Leta Baptist, MD  PREOPERATIVE DIAGNOSES: 1. Left maxillary antrochoanal polyp. 2. Chronic left maxillary sinusitis.  POSTOPERATIVE DIAGNOSES: 1. Left maxillary antrochoanal polyp. 2. Chronic left maxillary sinusitis.  PROCEDURE PERFORMED:  Left endoscopic maxillary antrostomy with polyp removal.  ANESTHESIA:  General endotracheal tube anesthesia.  COMPLICATIONS:  None.  ESTIMATED BLOOD LOSS:  Minimal.  INDICATION FOR PROCEDURE:  The patient is a 39 year old female with a history of chronic left facial pressure and left-sided nasal obstruction.  She was noted to have a large left antrochoanal polyp on endoscopic examination and her sinus CT scan.  The patient was treated with topical and systemic steroid without improvement in her symptoms. She was also previously treated with multiple courses of antibiotics. Based on the above findings, the decision was made for the patient to undergo the maxillary antrostomy procedure with removal of the large antrochoanal polyp.  The risks, benefits, alternatives, and details of the procedure were discussed with the patient.  Questions were invited and answered.  Informed consent was obtained.  DESCRIPTION OF PROCEDURE:  The patient was taken to the operating room and placed supine on the operating table.  General endotracheal tube anesthesia was administered by the anesthesiologist.  Preop IV antibiotics was given.  The patient was positioned, and prepped and draped in a standard fashion for nasal surgery.  Pledgets soaked with Afrin were placed in both nasal cavities for vasoconstriction.  The pledgets were  subsequently removed.  Endoscopic evaluation of the right nasal cavity revealed no polypoid tissue or infection.  Endoscopic evaluation of the left nasal cavity revealed large left maxillary antrochoanal polyp.  The polyp was subsequently removed in a piecemeal fashion using a combination of Blakesley forceps, Tru-Cut forceps, backbiter, and upbiting forceps.  The left maxillary openings was subsequently enlarged using the backbiter forceps.  Photodocumentation of the left nasal endoscopy findings were also obtained.  Hemostasis was achieved with the suction electrocautery.  The surgical site was copiously irrigated.  NASOPORE packing was placed.  That concluded the procedure for the patient.  The care to the patient was turned over to the anesthesiologist.  The patient was awakened from anesthesia without difficulty.  She was extubated and transferred to the recovery room in good condition.  OPERATIVE FINDINGS:  A large left maxillary antrochoanal polyp was noted.  It was completely removed.  SPECIMEN:  Left maxillary antrochoanal polyp.  FOLLOWUP CARE:  The patient will be discharged home once she is awake and alert.  She will be placed on Vicodin p.r.n. pain and amoxicillin p.o. b.i.d. for 5 days.  The patient will follow up in my office in 1 week.     Leta Baptist, MD     ST/MEDQ  D:  03/10/2014  T:  03/10/2014  Job:  [id number]  cc:   Nicki Reaper A. Wolfgang Phoenix, MD

## 2014-03-12 ENCOUNTER — Encounter: Payer: Self-pay | Admitting: Nurse Practitioner

## 2014-03-12 NOTE — Progress Notes (Signed)
Subjective:  Presents complaints of a rash localized to her upper mid chest area, began 4 days ago. Minimally pruritic. No known contacts. No known allergens. No change in topical hygiene products. Has completed prednisone and antibiotics given back in May. Is scheduled for sinus surgery next week.  Objective:   BP 134/90  Temp(Src) 98.6 F (37 C)  Ht 5\' 2"  (1.575 m)  Wt 201 lb (91.173 kg)  BMI 36.75 kg/m2 NAD. Alert, oriented. Localized well-defined area of discrete faintly pink slightly raised papules noted along sun exposed area any upper mid chest. No other rash is noted. Rash is clearly defined along border of sun exposure.   Assessment: Photodermatitis  Plan: Meds ordered this encounter  Medications  . clobetasol cream (TEMOVATE) 0.05 %    Sig: Apply 1 application topically 2 (two) times daily.    Dispense:  30 g    Refill:  0    Order Specific Question:  Supervising Provider    Answer:  Mikey Kirschner [[id number]]  . fluticasone (FLONASE) 50 MCG/ACT nasal spray    Sig:    Antihistamines as directed. Call back if worsens or persists.

## 2014-03-27 ENCOUNTER — Ambulatory Visit (INDEPENDENT_AMBULATORY_CARE_PROVIDER_SITE_OTHER): Payer: BC Managed Care – PPO | Admitting: Otolaryngology

## 2014-03-27 DIAGNOSIS — J338 Other polyp of sinus: Secondary | ICD-10-CM

## 2014-03-27 DIAGNOSIS — J320 Chronic maxillary sinusitis: Secondary | ICD-10-CM

## 2014-03-27 DIAGNOSIS — J322 Chronic ethmoidal sinusitis: Secondary | ICD-10-CM

## 2014-03-28 ENCOUNTER — Encounter: Payer: Self-pay | Admitting: Family Medicine

## 2014-04-17 ENCOUNTER — Other Ambulatory Visit: Payer: Self-pay | Admitting: Family Medicine

## 2014-04-21 ENCOUNTER — Encounter: Payer: Self-pay | Admitting: Nurse Practitioner

## 2014-04-21 ENCOUNTER — Ambulatory Visit (INDEPENDENT_AMBULATORY_CARE_PROVIDER_SITE_OTHER): Payer: BC Managed Care – PPO | Admitting: Nurse Practitioner

## 2014-04-21 VITALS — BP 116/80 | Ht 62.0 in | Wt 204.0 lb

## 2014-04-21 DIAGNOSIS — J069 Acute upper respiratory infection, unspecified: Secondary | ICD-10-CM

## 2014-04-21 MED ORDER — AMOXICILLIN-POT CLAVULANATE 875-125 MG PO TABS: 1.0000 | ORAL_TABLET | Freq: Two times a day (BID) | ORAL | Status: DC

## 2014-04-24 ENCOUNTER — Ambulatory Visit (INDEPENDENT_AMBULATORY_CARE_PROVIDER_SITE_OTHER): Payer: BC Managed Care – PPO | Admitting: Otolaryngology

## 2014-04-24 DIAGNOSIS — J320 Chronic maxillary sinusitis: Secondary | ICD-10-CM

## 2014-04-24 DIAGNOSIS — J338 Other polyp of sinus: Secondary | ICD-10-CM

## 2014-04-25 ENCOUNTER — Encounter: Payer: Self-pay | Admitting: Nurse Practitioner

## 2014-04-25 NOTE — Progress Notes (Signed)
Subjective:  Presents complaints of congestion runny nose and sore throat that began several days ago. No fever. No headache. Frequent cough producing dark mucus. Runny nose. No ear pain. No wheezing. Exposed to a nephew who was sick. Has upcoming appointment with ENT specialist. History of polyps on the left side of the nose.  Objective:   BP 116/80  Ht 5\' 2"  (1.575 m)  Wt 204 lb (92.534 kg)  BMI 37.30 kg/m2 NAD. Alert, oriented. TMs clear effusion, no erythema. Pharynx injected with green PND noted. Neck supple with mild soft anterior adenopathy. Lungs clear. Heart regular rate rhythm.  Assessment: Acute upper respiratory infections of unspecified site  Plan:  Meds ordered this encounter  Medications  . amoxicillin-clavulanate (AUGMENTIN) 875-125 MG per tablet    Sig: Take 1 tablet by mouth 2 (two) times daily.    Dispense:  20 tablet    Refill:  0    Order Specific Question:  Supervising Provider    Answer:  Mikey Kirschner [[id number]]   Continue Flonase and loratadine as directed. Call back by the end of the week if no improvement, sooner if worse. Otherwise followup with ENT specialist as planned.

## 2014-06-13 ENCOUNTER — Other Ambulatory Visit: Payer: Self-pay

## 2014-06-19 ENCOUNTER — Encounter: Payer: Self-pay | Admitting: Family Medicine

## 2014-06-19 ENCOUNTER — Ambulatory Visit (INDEPENDENT_AMBULATORY_CARE_PROVIDER_SITE_OTHER): Payer: BC Managed Care – PPO | Admitting: Family Medicine

## 2014-06-19 VITALS — BP 138/88 | Ht 62.0 in | Wt 206.0 lb

## 2014-06-19 DIAGNOSIS — M25511 Pain in right shoulder: Secondary | ICD-10-CM

## 2014-06-19 DIAGNOSIS — M5090 Cervical disc disorder, unspecified, unspecified cervical region: Secondary | ICD-10-CM

## 2014-06-19 DIAGNOSIS — R202 Paresthesia of skin: Secondary | ICD-10-CM

## 2014-06-19 MED ORDER — DICLOFENAC SODIUM 75 MG PO TBEC: 75.0000 mg | DELAYED_RELEASE_TABLET | Freq: Two times a day (BID) | ORAL | Status: DC

## 2014-06-19 NOTE — Progress Notes (Signed)
   Subjective:    Patient ID: Emily Braun, female    DOB: 1976-03-12, 38 y.o.   MRN: [id number]  Arm Pain  The incident occurred more than 1 week ago. The incident occurred at home. There was no injury mechanism. The pain is present in the right elbow, right forearm and right shoulder. The quality of the pain is described as aching. The pain radiates to the right neck. The pain has been intermittent since the incident. Associated symptoms include tingling. The symptoms are aggravated by movement and lifting. Treatments tried: Voltaren. The treatment provided mild relief.   More in the am Better as the day goes on Present for several months with lifting But this over the past 2 weeks Tried Voltaren   Review of Systems  Neurological: Positive for tingling.   denies chest pain shortness of breath does relate pain radiating from the neck into the shoulder and to the mid arm region.     Objective:   Physical Exam Eardrums normal throat is normal neck no masses lungs are clear no crackles heart is regular strength bilateral is good reflexes good subjective discomfort right side of neck right trapezius right shoulder. Range of motion exercises with the shoulder is good. Negative after Henschen test. Other tests were completed to evaluate the rotator cuff and these were negative as well       Assessment & Plan:  Probable nerve impingement in the neck. I would recommend anti-inflammatory over the next several weeks if not improving over the next 4-6 weeks next step would be cervical spine x-rays followed by MRI.  She also describes possible carpal tunnel I discussed at length the importance of using braces at nighttime to keep the wrist right. If this fails or if it progresses nerve conduction tests are necessary.  Patient was told if she has progressive symptoms to let us know followup in 4-6 weeks

## 2014-07-17 ENCOUNTER — Encounter: Payer: Self-pay | Admitting: Family Medicine

## 2014-07-17 ENCOUNTER — Ambulatory Visit (INDEPENDENT_AMBULATORY_CARE_PROVIDER_SITE_OTHER): Payer: BC Managed Care – PPO | Admitting: Family Medicine

## 2014-07-17 VITALS — BP 122/84 | Ht 62.0 in | Wt 207.0 lb

## 2014-07-17 DIAGNOSIS — J019 Acute sinusitis, unspecified: Secondary | ICD-10-CM

## 2014-07-17 DIAGNOSIS — M25511 Pain in right shoulder: Secondary | ICD-10-CM

## 2014-07-17 MED ORDER — CEFPROZIL 500 MG PO TABS: 500.0000 mg | ORAL_TABLET | Freq: Two times a day (BID) | ORAL | Status: DC

## 2014-07-17 NOTE — Progress Notes (Signed)
   Subjective:    Patient ID: Emily Braun, female    DOB: 1976/06/03, 38 y.o.   MRN: [id number]  HPI Patient is here today for a recheck on her right, shoulder.  She was seen here on 10/22. She said her pain is better. Arm and neck better Stopped NSAID due to reflux Some tingling in the hands She has also been fighting a cough/sinus issues for about a week now. Started about 2 weeks ago No dyspnea Severe cough with occas wheeze No fever no sweats   Review of Systems    denies high fever chills sweats nausea vomiting diarrhea Objective:   Physical Exam  Mild sinus tenderness eardrums normal throat is normal neck supple lungs clear heart regular shoulder has good range of motion neck is normal      Assessment & Plan:  Shoulder is much better I find no evidence of underlying issues I doubt that she has a herniated disc if ongoing trouble notify us  Mild viral illness with sinus infection antibiotics prescribed warning signs discussed follow-up if problems  Patient was told if she Has continued intermittent numbness in the hands we will need to refer her to neurology for nerve conduction studies

## 2014-08-07 ENCOUNTER — Ambulatory Visit (INDEPENDENT_AMBULATORY_CARE_PROVIDER_SITE_OTHER): Payer: BC Managed Care – PPO | Admitting: Otolaryngology

## 2014-08-07 DIAGNOSIS — J310 Chronic rhinitis: Secondary | ICD-10-CM

## 2014-08-07 DIAGNOSIS — J320 Chronic maxillary sinusitis: Secondary | ICD-10-CM

## 2014-08-10 ENCOUNTER — Other Ambulatory Visit: Payer: Self-pay | Admitting: Nurse Practitioner

## 2014-08-18 ENCOUNTER — Other Ambulatory Visit: Payer: Self-pay | Admitting: Family Medicine

## 2014-08-25 ENCOUNTER — Ambulatory Visit (INDEPENDENT_AMBULATORY_CARE_PROVIDER_SITE_OTHER): Payer: BC Managed Care – PPO | Admitting: Nurse Practitioner

## 2014-08-25 ENCOUNTER — Encounter: Payer: Self-pay | Admitting: Nurse Practitioner

## 2014-08-25 VITALS — BP 128/82 | Ht 62.0 in | Wt 207.2 lb

## 2014-08-25 DIAGNOSIS — R1314 Dysphagia, pharyngoesophageal phase: Secondary | ICD-10-CM

## 2014-08-25 DIAGNOSIS — Z01419 Encounter for gynecological examination (general) (routine) without abnormal findings: Secondary | ICD-10-CM

## 2014-08-25 DIAGNOSIS — Z0000 Encounter for general adult medical examination without abnormal findings: Secondary | ICD-10-CM

## 2014-08-25 DIAGNOSIS — K219 Gastro-esophageal reflux disease without esophagitis: Secondary | ICD-10-CM

## 2014-08-25 DIAGNOSIS — Z124 Encounter for screening for malignant neoplasm of cervix: Secondary | ICD-10-CM

## 2014-08-25 DIAGNOSIS — I10 Essential (primary) hypertension: Secondary | ICD-10-CM

## 2014-08-25 DIAGNOSIS — R5382 Chronic fatigue, unspecified: Secondary | ICD-10-CM

## 2014-08-25 DIAGNOSIS — R7301 Impaired fasting glucose: Secondary | ICD-10-CM

## 2014-08-25 LAB — HEPATIC FUNCTION PANEL
ALT: 24 U/L (ref 0–35)
AST: 25 U/L (ref 0–37)
Albumin: 4.2 g/dL (ref 3.5–5.2)
Alkaline Phosphatase: 61 U/L (ref 39–117)
BILIRUBIN DIRECT: 0.1 mg/dL (ref 0.0–0.3)
BILIRUBIN TOTAL: 0.3 mg/dL (ref 0.2–1.2)
Indirect Bilirubin: 0.2 mg/dL (ref 0.2–1.2)
Total Protein: 6.8 g/dL (ref 6.0–8.3)

## 2014-08-25 LAB — CBC WITH DIFFERENTIAL/PLATELET
BASOS ABS: 0.1 10*3/uL (ref 0.0–0.1)
Basophils Relative: 1 % (ref 0–1)
EOS ABS: 0.2 10*3/uL (ref 0.0–0.7)
Eosinophils Relative: 2 % (ref 0–5)
HCT: 42.7 % (ref 36.0–46.0)
Hemoglobin: 14.5 g/dL (ref 12.0–15.0)
Lymphocytes Relative: 19 % (ref 12–46)
Lymphs Abs: 1.6 10*3/uL (ref 0.7–4.0)
MCH: 28.7 pg (ref 26.0–34.0)
MCHC: 34 g/dL (ref 30.0–36.0)
MCV: 84.6 fL (ref 78.0–100.0)
MPV: 10.4 fL (ref 9.4–12.4)
Monocytes Absolute: 0.6 10*3/uL (ref 0.1–1.0)
Monocytes Relative: 7 % (ref 3–12)
Neutro Abs: 5.8 10*3/uL (ref 1.7–7.7)
Neutrophils Relative %: 71 % (ref 43–77)
Platelets: 370 10*3/uL (ref 150–400)
RBC: 5.05 MIL/uL (ref 3.87–5.11)
RDW: 13.1 % (ref 11.5–15.5)
WBC: 8.2 10*3/uL (ref 4.0–10.5)

## 2014-08-25 LAB — BASIC METABOLIC PANEL
BUN: 10 mg/dL (ref 6–23)
CALCIUM: 9.5 mg/dL (ref 8.4–10.5)
CO2: 20 mEq/L (ref 19–32)
Chloride: 105 mEq/L (ref 96–112)
Creat: 0.67 mg/dL (ref 0.50–1.10)
Glucose, Bld: 133 mg/dL — ABNORMAL HIGH (ref 70–99)
POTASSIUM: 4.3 meq/L (ref 3.5–5.3)
SODIUM: 134 meq/L — AB (ref 135–145)

## 2014-08-25 LAB — LIPID PANEL
Cholesterol: 95 mg/dL (ref 0–200)
HDL: 40 mg/dL (ref >39)
LDL CALC: 43 mg/dL (ref 0–99)
Total CHOL/HDL Ratio: 2.4 Ratio
Triglycerides: 59 mg/dL (ref <150)
VLDL: 12 mg/dL (ref 0–40)

## 2014-08-25 LAB — TSH: TSH: 1.162 u[IU]/mL (ref 0.350–4.500)

## 2014-08-25 LAB — HEMOGLOBIN A1C
HEMOGLOBIN A1C: 6.3 % — AB (ref <5.7)
Mean Plasma Glucose: 134 mg/dL — ABNORMAL HIGH (ref <117)

## 2014-08-25 MED ORDER — PANTOPRAZOLE SODIUM 40 MG PO TBEC: 40.0000 mg | DELAYED_RELEASE_TABLET | Freq: Every day | ORAL | Status: DC

## 2014-08-25 NOTE — Progress Notes (Signed)
Subjective:    Patient ID: Emily Braun, female    DOB: [birth year], 38 y.o.   MRN: [id number]  HPI presents for a wellness exam. No new sexual partners. Regular vision exams. Needs dental exam. Continues to have some difficulty swallowing mainly occurring in the area at the base of the throat. Worse with steak or large bites of food which tend to get stuck in this area. Has to chew her food intake a lot of liquids to have them go down. Rare sore throat. No hoarseness. No obvious acid reflux, takes Zantac occasionally. No abdominal pain. Discussed this with Dr. Benjamine Mola who according to the patient did not seem to be very concerned.    Review of Systems  Constitutional: Positive for fatigue. Negative for fever, activity change and appetite change.  HENT: Positive for congestion, postnasal drip, rhinorrhea and trouble swallowing. Negative for dental problem, ear pain and sinus pressure.   Respiratory: Positive for choking. Negative for cough, chest tightness, shortness of breath and wheezing.   Cardiovascular: Negative for chest pain.  Gastrointestinal: Positive for abdominal pain and constipation. Negative for nausea, vomiting, diarrhea and abdominal distention.       Mild epigastric area tenderness especially upper area. Otherwise benign.  Genitourinary: Positive for enuresis. Negative for dysuria, urgency, frequency, vaginal discharge, difficulty urinating, genital sores, menstrual problem and pelvic pain.       Minimal urinary leakage only with cough or sneezing. Chronic issue. Has seen urology.       Objective:   Physical Exam  Constitutional: She is oriented to person, place, and time. She appears well-developed. No distress.  HENT:  Right Ear: External ear normal.  Left Ear: External ear normal.  Mouth/Throat: Oropharynx is clear and moist.  Neck: Normal range of motion. Neck supple. No tracheal deviation present. No thyromegaly present.  Thyroid smooth, nontender;no masses or  goiters noted.  Cardiovascular: Normal rate, regular rhythm and normal heart sounds.  Exam reveals no gallop.   No murmur heard. Pulmonary/Chest: Effort normal and breath sounds normal.  Abdominal: Soft. She exhibits no distension. There is tenderness.  Genitourinary: Vagina normal and uterus normal. No vaginal discharge found.  External GU: no rashes or lesions. Vagina: no discharge. Bimanual exam: no tenderness or obvious masses. Exam limited due to abd girth.  Musculoskeletal: She exhibits no edema.  Lymphadenopathy:    She has no cervical adenopathy.  Neurological: She is alert and oriented to person, place, and time.  Skin: Skin is warm and dry. No rash noted.  Psychiatric: She has a normal mood and affect. Her behavior is normal.  Vitals reviewed. Breast exam: dense tissue; no masses; axillae no adenopathy.        Assessment & Plan:   Problem List Items Addressed This Visit      Cardiovascular and Mediastinum   Essential hypertension, benign   Relevant Orders      CBC with Differential      Lipid panel      Basic metabolic panel     Digestive   GERD (gastroesophageal reflux disease)   Relevant Medications      pantoprazole (PROTONIX) EC tablet     Endocrine   Impaired fasting glucose   Relevant Orders      Basic metabolic panel      Hemoglobin A1c    Other Visit Diagnoses    Well woman exam    -  Primary    Relevant Orders       Pap IG w/  reflex to HPV when ASC-U       Hepatic function panel       Basic metabolic panel    Screening for cervical cancer        Relevant Orders       Pap IG w/ reflex to HPV when ASC-U    Dysphagia, pharyngoesophageal phase        Relevant Orders       CBC with Differential    Chronic fatigue        Relevant Orders       CBC with Differential       TSH       Encouraged healthy diet, regular exercise and weight loss. Daily vitamin D and calcium. Switched from Zantac to [employer], take daily for few weeks. If epigastric  discomfort with palpation and swallowing difficulty continues, patient to call back to our office. Return in about 6 months (around 02/24/2015).

## 2014-08-26 LAB — PAP IG W/ RFLX HPV ASCU

## 2014-09-15 ENCOUNTER — Other Ambulatory Visit: Payer: Self-pay | Admitting: Family Medicine

## 2014-09-23 ENCOUNTER — Other Ambulatory Visit: Payer: Self-pay | Admitting: Nurse Practitioner

## 2014-10-06 ENCOUNTER — Ambulatory Visit (INDEPENDENT_AMBULATORY_CARE_PROVIDER_SITE_OTHER): Payer: BLUE CROSS/BLUE SHIELD | Admitting: Family Medicine

## 2014-10-06 ENCOUNTER — Encounter: Payer: Self-pay | Admitting: Family Medicine

## 2014-10-06 VITALS — Temp 98.8°F | Ht 62.0 in | Wt 209.0 lb

## 2014-10-06 DIAGNOSIS — J0110 Acute frontal sinusitis, unspecified: Secondary | ICD-10-CM

## 2014-10-06 DIAGNOSIS — R7303 Prediabetes: Secondary | ICD-10-CM | POA: Insufficient documentation

## 2014-10-06 DIAGNOSIS — R7309 Other abnormal glucose: Secondary | ICD-10-CM

## 2014-10-06 MED ORDER — AMOXICILLIN-POT CLAVULANATE 875-125 MG PO TABS: 1.0000 | ORAL_TABLET | Freq: Two times a day (BID) | ORAL | Status: DC

## 2014-10-06 MED ORDER — FLUCONAZOLE 150 MG PO TABS: 150.0000 mg | ORAL_TABLET | Freq: Once | ORAL | Status: DC

## 2014-10-06 MED ORDER — METFORMIN HCL 500 MG PO TABS: ORAL_TABLET | ORAL | Status: DC

## 2014-10-06 NOTE — Progress Notes (Signed)
   Subjective:    Patient ID: Emily Braun, female    DOB: Jun 24, 1976, 39 y.o.   MRN: [id number]  Sinusitis This is a new problem. Episode onset: a week ago. The problem has been waxing and waning since onset. There has been no fever. Associated symptoms include congestion, coughing, ear pain, sinus pressure and a sore throat. Pertinent negatives include no shortness of breath. Past treatments include oral decongestants. The treatment provided mild relief.    Patient noticed that her A1c went up on recent lab work.  Review of Systems  Constitutional: Negative for fever and activity change.  HENT: Positive for congestion, ear pain, rhinorrhea, sinus pressure and sore throat.   Eyes: Negative for discharge.  Respiratory: Positive for cough. Negative for shortness of breath and wheezing.   Cardiovascular: Negative for chest pain.       Objective:   Physical Exam  Constitutional: She appears well-developed.  HENT:  Head: Normocephalic.  Nose: Nose normal.  Mouth/Throat: Oropharynx is clear and moist. No oropharyngeal exudate.  Neck: Neck supple.  Cardiovascular: Normal rate and normal heart sounds.   No murmur heard. Pulmonary/Chest: Effort normal and breath sounds normal. She has no wheezes.  Lymphadenopathy:    She has no cervical adenopathy.  Skin: Skin is warm and dry.  Nursing note and vitals reviewed.         Assessment & Plan:  Sinusitis antibiotics prescribed warning signs discussed follow-up if problems  Prediabetes discussed the importance of weight loss watching diet regular physical activity and some muscle activity twice per week. Follow-up in 3 months. Start metformin half tablet twice a day. Side effects discussed.

## 2014-11-16 ENCOUNTER — Other Ambulatory Visit: Payer: Self-pay | Admitting: Nurse Practitioner

## 2015-01-02 ENCOUNTER — Ambulatory Visit (INDEPENDENT_AMBULATORY_CARE_PROVIDER_SITE_OTHER): Payer: BLUE CROSS/BLUE SHIELD | Admitting: Family Medicine

## 2015-01-02 ENCOUNTER — Encounter: Payer: Self-pay | Admitting: Family Medicine

## 2015-01-02 VITALS — Ht 62.0 in | Wt 209.0 lb

## 2015-01-02 DIAGNOSIS — R7309 Other abnormal glucose: Secondary | ICD-10-CM | POA: Diagnosis not present

## 2015-01-02 DIAGNOSIS — R7303 Prediabetes: Secondary | ICD-10-CM

## 2015-01-02 DIAGNOSIS — I10 Essential (primary) hypertension: Secondary | ICD-10-CM | POA: Diagnosis not present

## 2015-01-02 LAB — POCT GLYCOSYLATED HEMOGLOBIN (HGB A1C): Hemoglobin A1C: 6.3

## 2015-01-02 MED ORDER — LISINOPRIL 10 MG PO TABS: 10.0000 mg | ORAL_TABLET | Freq: Every day | ORAL | Status: DC

## 2015-01-02 NOTE — Progress Notes (Signed)
   Subjective:    Patient ID: Emily Braun, female    DOB: [birth year], 39 y.o.   MRN: [id number]  Hypertension This is a chronic problem. Pertinent negatives include no chest pain.  prediabetes A1C today 6.3. Pt states no concerns today.  Patient relates that she's been under a lot of stress. Grandmother is been sick and in the hospital. Patient not always doing a great job with diet control not exercising.  Review of Systems  Constitutional: Negative for activity change, appetite change and fatigue.  HENT: Negative for congestion.   Respiratory: Negative for cough.   Cardiovascular: Negative for chest pain.  Gastrointestinal: Negative for abdominal pain.  Endocrine: Negative for polydipsia and polyphagia.  Neurological: Negative for weakness.  Psychiatric/Behavioral: Negative for confusion.       Objective:   Physical Exam  Constitutional: She appears well-nourished. No distress.  Cardiovascular: Normal rate, regular rhythm and normal heart sounds.   No murmur heard. Pulmonary/Chest: Effort normal and breath sounds normal. No respiratory distress.  Musculoskeletal: She exhibits no edema.  Lymphadenopathy:    She has no cervical adenopathy.  Neurological: She is alert. She exhibits normal muscle tone.  Psychiatric: Her behavior is normal.  Vitals reviewed.         Assessment & Plan:  Prediabetes A1c 6.3 increase metformin half tablet twice a day follow-up in 3-4 months for another A1c  HTN subpar control recommend bumping up the dose of lisinopril to 10 mg she will check it at the [facility] where she [facility] that she will send [place] blood pressure readings over the next few weeks  No need for comprehensive lab work currently. We will do lab work later this fall

## 2015-01-30 ENCOUNTER — Ambulatory Visit (INDEPENDENT_AMBULATORY_CARE_PROVIDER_SITE_OTHER): Payer: BLUE CROSS/BLUE SHIELD | Admitting: Family Medicine

## 2015-01-30 ENCOUNTER — Encounter: Payer: Self-pay | Admitting: Family Medicine

## 2015-01-30 VITALS — BP 128/88 | Temp 98.5°F | Ht 62.0 in | Wt 209.0 lb

## 2015-01-30 DIAGNOSIS — K589 Irritable bowel syndrome without diarrhea: Secondary | ICD-10-CM | POA: Diagnosis not present

## 2015-01-30 DIAGNOSIS — A084 Viral intestinal infection, unspecified: Secondary | ICD-10-CM

## 2015-01-30 MED ORDER — HYOSCYAMINE SULFATE 0.125 MG SL SUBL
0.1250 mg | SUBLINGUAL_TABLET | Freq: Four times a day (QID) | SUBLINGUAL | Status: DC | PRN
Start: 2015-01-30 — End: 2016-05-13

## 2015-01-30 NOTE — Patient Instructions (Signed)
Stop metformin for 2 weeks Then restart at 1/2 per day If all well after 2 more weeks then 1/2 with meals twice a day  Then if this causes trouble then go back to 1/2 daily   Use immodium up to 4 times a day for the diarrhea If not improving by next week plz call we will need to order labs

## 2015-01-30 NOTE — Progress Notes (Signed)
   Subjective:    Patient ID: ALIZZA SACRA, female    DOB: [birth year], 39 y.o.   MRN: [id number]  Abdominal Pain This is a new problem. The current episode started in the past 7 days. Associated symptoms include diarrhea. Pertinent negatives include no dysuria, fever, nausea or vomiting.  diarrhea off and on for the past two weeks. Stopped metformin yesterday.  Sinus congestion.  No mucous in the bm No blood No fever Pain crampoing style   Review of Systems  Constitutional: Negative for fever and fatigue.  HENT: Negative for congestion.   Respiratory: Negative for cough.   Gastrointestinal: Positive for abdominal pain and diarrhea. Negative for nausea and vomiting.  Genitourinary: Negative for dysuria.       Objective:   Physical Exam Lungs clear heart regular abdomen is soft no guarding rebound or tenderness flanks nontender extremities no edema       Assessment & Plan:  Abdominal pain/diarrhea/viral process-Imodium 4 times a day when necessary Could be a flare of IBS. Levsin/SL should also be helpful. If not improving by early next week will need lab work No need for GI referral currently.

## 2015-02-06 ENCOUNTER — Other Ambulatory Visit: Payer: Self-pay | Admitting: Family Medicine

## 2015-02-12 ENCOUNTER — Ambulatory Visit (INDEPENDENT_AMBULATORY_CARE_PROVIDER_SITE_OTHER): Payer: BLUE CROSS/BLUE SHIELD | Admitting: Otolaryngology

## 2015-02-12 DIAGNOSIS — J330 Polyp of nasal cavity: Secondary | ICD-10-CM

## 2015-02-12 DIAGNOSIS — J320 Chronic maxillary sinusitis: Secondary | ICD-10-CM

## 2015-03-13 ENCOUNTER — Telehealth: Payer: Self-pay

## 2015-03-13 MED ORDER — METFORMIN HCL ER 500 MG PO TB24: 500.0000 mg | ORAL_TABLET | Freq: Every day | ORAL | Status: DC

## 2015-03-13 NOTE — Telephone Encounter (Signed)
Per Dr. Nicki Reaper- Patient complaining of diarrhea and bloating. D/C plain Metformin and start Metformin XR 500 mg 1 tablet daily # 30 6 RFS. Recheck in 2 weeks if symptoms persist. Med sent to pharmacy.

## 2015-04-24 ENCOUNTER — Other Ambulatory Visit: Payer: Self-pay | Admitting: Family Medicine

## 2015-04-24 ENCOUNTER — Ambulatory Visit: Payer: BLUE CROSS/BLUE SHIELD | Admitting: Family Medicine

## 2015-04-27 NOTE — Telephone Encounter (Signed)
Ok plus four ref in [facility] name

## 2015-05-08 ENCOUNTER — Ambulatory Visit: Payer: BLUE CROSS/BLUE SHIELD | Admitting: Family Medicine

## 2015-05-25 ENCOUNTER — Ambulatory Visit (INDEPENDENT_AMBULATORY_CARE_PROVIDER_SITE_OTHER): Payer: BLUE CROSS/BLUE SHIELD | Admitting: Family Medicine

## 2015-05-25 ENCOUNTER — Other Ambulatory Visit: Payer: Self-pay | Admitting: Family Medicine

## 2015-05-25 ENCOUNTER — Encounter: Payer: Self-pay | Admitting: Family Medicine

## 2015-05-25 VITALS — BP 122/78 | Ht 62.0 in | Wt 209.2 lb

## 2015-05-25 DIAGNOSIS — Z79899 Other long term (current) drug therapy: Secondary | ICD-10-CM

## 2015-05-25 DIAGNOSIS — I10 Essential (primary) hypertension: Secondary | ICD-10-CM

## 2015-05-25 DIAGNOSIS — K219 Gastro-esophageal reflux disease without esophagitis: Secondary | ICD-10-CM

## 2015-05-25 DIAGNOSIS — R7303 Prediabetes: Secondary | ICD-10-CM

## 2015-05-25 DIAGNOSIS — R7309 Other abnormal glucose: Secondary | ICD-10-CM

## 2015-05-25 LAB — POCT GLYCOSYLATED HEMOGLOBIN (HGB A1C): HEMOGLOBIN A1C: 7.2

## 2015-05-25 MED ORDER — METFORMIN HCL ER (OSM) 1000 MG PO TB24: 1000.0000 mg | ORAL_TABLET | Freq: Every day | ORAL | Status: DC

## 2015-05-25 MED ORDER — KETOCONAZOLE 2 % EX CREA: 1.0000 "application " | TOPICAL_CREAM | Freq: Two times a day (BID) | CUTANEOUS | Status: DC

## 2015-05-25 NOTE — Progress Notes (Signed)
   Subjective:    Patient ID: Emily Braun, female    DOB: [birth year], 39 y.o.   MRN: [id number]  Hypertension This is a chronic problem. The current episode started more than 1 year ago. Pertinent negatives include no chest pain. There are no compliance problems.    patient states her blood pressure been under good control. Tries to avoid extra salt diet. Patient states that she has a rash under bilateral arms. This rash is been going on for a couple weeks. Does not itch or. [place]. No known triggers. Underneath the axilla region  Patient also relates she is not been doing the job watching how she has not been exercising under a fair amount of stress with work and with the care of her grandmother.  Irritable bowel stable some intermittent loose stools Her moods overall are doing well taking the Celexa on a regular basis   A1C; 7.2 this is significantly elevated compared to where it was Review of Systems  Constitutional: Negative for activity change, appetite change and fatigue.  HENT: Negative for congestion.   Respiratory: Negative for cough.   Cardiovascular: Negative for chest pain.  Gastrointestinal: Negative for abdominal pain.  Endocrine: Negative for polydipsia and polyphagia.  Neurological: Negative for weakness.  Psychiatric/Behavioral: Negative for confusion.        Objective:   Physical Exam  Constitutional: She appears well-nourished. No distress.  Cardiovascular: Normal rate, regular rhythm and normal heart sounds.   No murmur heard. Pulmonary/Chest: Effort normal and breath sounds normal. No respiratory distress.  Musculoskeletal: She exhibits no edema.  Lymphadenopathy:    She has no cervical adenopathy.  Neurological: She is alert. She exhibits normal muscle tone.  Psychiatric: Her behavior is normal.  Vitals reviewed.         Assessment & Plan:  1. Prediabetes Subpar control increase metformin to 1000 mg extended release if troubles or problems  notify us repeat lab work in 3 months may need additional medication - POCT HgB A1C - Hemoglobin A1c - Microalbumin / creatinine urine ratio  2. Essential hypertension, benign Regular exercise recommended. We will go ahead and check lipid profile. - Lipid panel  3. Gastroesophageal reflux disease without esophagitis Patient under decent control with her medication continue this.  4. High risk medication use We will check some lab work make sure she is getting along with medication. She will do this lab work in 3 months with follow-up office visit - Hepatic function panel - Basic metabolic panel The rash under her arms appears benign I would recommend trying Nizoral cream if not better over the next 2 weeks notify us 25 minutes spent with patient covering multiple aspects. Greater than half in discussion of questions

## 2015-05-25 NOTE — Patient Instructions (Signed)
Diabetes Mellitus and Food It is important for you to manage your blood sugar (glucose) level. Your blood glucose level can be greatly affected by what you eat. Eating healthier foods in the appropriate amounts throughout the day at about the same time each day will help you control your blood glucose level. It can also help slow or prevent worsening of your diabetes mellitus. Healthy eating may even help you improve the level of your blood pressure and reach or maintain a healthy weight.  HOW CAN FOOD AFFECT ME? Carbohydrates Carbohydrates affect your blood glucose level more than any other type of food. Your dietitian will help you determine how many carbohydrates to eat at each meal and teach you how to count carbohydrates. Counting carbohydrates is important to keep your blood glucose at a healthy level, especially if you are using insulin or taking certain medicines for diabetes mellitus. Alcohol Alcohol can cause sudden decreases in blood glucose (hypoglycemia), especially if you use insulin or take certain medicines for diabetes mellitus. Hypoglycemia can be a life-threatening condition. Symptoms of hypoglycemia (sleepiness, dizziness, and disorientation) are similar to symptoms of having too much alcohol.  If your health care provider has given you approval to drink alcohol, do so in moderation and use the following guidelines:  Women should not have more than one drink per day, and men should not have more than two drinks per day. One drink is equal to:  12 oz of beer.  5 oz of wine.  1 oz of hard liquor.  Do not drink on an empty stomach.  Keep yourself hydrated. Have water, diet soda, or unsweetened iced tea.  Regular soda, juice, and other mixers might contain a lot of carbohydrates and should be counted. WHAT FOODS ARE NOT RECOMMENDED? As you make food choices, it is important to remember that all foods are not the same. Some foods have fewer nutrients per serving than other  foods, even though they might have the same number of calories or carbohydrates. It is difficult to get your body what it needs when you eat foods with fewer nutrients. Examples of foods that you should avoid that are high in calories and carbohydrates but low in nutrients include:  Trans fats (most processed foods list trans fats on the Nutrition Facts label).  Regular soda.  Juice.  Candy.  Sweets, such as cake, pie, doughnuts, and cookies.  Fried foods. WHAT FOODS CAN I EAT? Have nutrient-rich foods, which will nourish your body and keep you healthy. The food you should eat also will depend on several factors, including:  The calories you need.  The medicines you take.  Your weight.  Your blood glucose level.  Your blood pressure level.  Your cholesterol level. You also should eat a variety of foods, including:  Protein, such as meat, poultry, fish, tofu, nuts, and seeds (lean animal proteins are best).  Fruits.  Vegetables.  Dairy products, such as milk, cheese, and yogurt (low fat is best).  Breads, grains, pasta, cereal, rice, and beans.  Fats such as olive oil, trans fat-free margarine, canola oil, avocado, and olives. DOES EVERYONE WITH DIABETES MELLITUS HAVE THE SAME MEAL PLAN? Because every person with diabetes mellitus is different, there is not one meal plan that works for everyone. It is very important that you meet with a dietitian who will help you create a meal plan that is just right for you. Document Released: 05/12/2005 Document Revised: 08/20/2013 Document Reviewed: 07/12/2013 Exit[employer] Patient Information 2015 [employer]. This   information is not intended to replace advice given to you by your health care provider. Make sure you discuss any questions you have with your health care provider.  

## 2015-05-26 ENCOUNTER — Other Ambulatory Visit: Payer: Self-pay | Admitting: Family Medicine

## 2015-06-09 ENCOUNTER — Encounter: Payer: Self-pay | Admitting: Family Medicine

## 2015-06-09 MED ORDER — SITAGLIPTIN PHOSPHATE 50 MG PO TABS: 50.0000 mg | ORAL_TABLET | Freq: Every day | ORAL | Status: DC

## 2015-06-09 NOTE — Telephone Encounter (Signed)
I would recommend Januvia,start at 50 mg daily, nurses will send in rx.recheck A1C in 3 months. Send me update with this med within 4 weeks. No diarrhea with this one.

## 2015-06-09 NOTE — Addendum Note (Signed)
Addended by: Launa Grill on: 06/09/2015 04:21 PM   Modules accepted: Orders, Medications

## 2015-07-07 ENCOUNTER — Other Ambulatory Visit: Payer: Self-pay | Admitting: Family Medicine

## 2015-07-27 ENCOUNTER — Other Ambulatory Visit: Payer: Self-pay | Admitting: Family Medicine

## 2015-08-26 LAB — HEPATIC FUNCTION PANEL
ALT: 30 IU/L (ref 0–32)
AST: 47 IU/L — ABNORMAL HIGH (ref 0–40)
Albumin: 4.3 g/dL (ref 3.5–5.5)
Alkaline Phosphatase: 69 IU/L (ref 39–117)
BILIRUBIN TOTAL: 0.3 mg/dL (ref 0.0–1.2)
Bilirubin, Direct: 0.08 mg/dL (ref 0.00–0.40)
TOTAL PROTEIN: 6.8 g/dL (ref 6.0–8.5)

## 2015-08-26 LAB — BASIC METABOLIC PANEL
BUN/Creatinine Ratio: 18 (ref 8–20)
BUN: 13 mg/dL (ref 6–20)
CO2: 19 mmol/L (ref 18–29)
Calcium: 9.8 mg/dL (ref 8.7–10.2)
Chloride: 96 mmol/L (ref 96–106)
Creatinine, Ser: 0.72 mg/dL (ref 0.57–1.00)
GFR calc non Af Amer: 107 mL/min/{1.73_m2} (ref >59)
GFR, EST AFRICAN AMERICAN: 123 mL/min/{1.73_m2} (ref >59)
Glucose: 201 mg/dL — ABNORMAL HIGH (ref 65–99)
Potassium: 4.5 mmol/L (ref 3.5–5.2)
Sodium: 138 mmol/L (ref 134–144)

## 2015-08-26 LAB — LIPID PANEL
CHOL/HDL RATIO: 2.6 ratio (ref 0.0–4.4)
Cholesterol, Total: 102 mg/dL (ref 100–199)
HDL: 40 mg/dL (ref >39)
LDL Calculated: 48 mg/dL (ref 0–99)
Triglycerides: 70 mg/dL (ref 0–149)
VLDL Cholesterol Cal: 14 mg/dL (ref 5–40)

## 2015-08-26 LAB — HEMOGLOBIN A1C
ESTIMATED AVERAGE GLUCOSE: 189 mg/dL
Hgb A1c MFr Bld: 8.2 % — ABNORMAL HIGH (ref 4.8–5.6)

## 2015-08-26 LAB — MICROALBUMIN / CREATININE URINE RATIO
Creatinine, Urine: 181.6 mg/dL
MICROALB/CREAT RATIO: 11.3 mg/g creat (ref 0.0–30.0)
MICROALBUM., U, RANDOM: 20.5 ug/mL

## 2015-08-28 ENCOUNTER — Encounter: Payer: Self-pay | Admitting: Nurse Practitioner

## 2015-08-28 ENCOUNTER — Ambulatory Visit (INDEPENDENT_AMBULATORY_CARE_PROVIDER_SITE_OTHER): Payer: BLUE CROSS/BLUE SHIELD | Admitting: Nurse Practitioner

## 2015-08-28 VITALS — BP 126/84 | Ht 62.0 in | Wt 205.2 lb

## 2015-08-28 DIAGNOSIS — Z0000 Encounter for general adult medical examination without abnormal findings: Secondary | ICD-10-CM

## 2015-08-28 DIAGNOSIS — E119 Type 2 diabetes mellitus without complications: Secondary | ICD-10-CM | POA: Insufficient documentation

## 2015-08-28 LAB — HM DIABETES EYE EXAM

## 2015-08-31 ENCOUNTER — Encounter: Payer: Self-pay | Admitting: Nurse Practitioner

## 2015-08-31 MED ORDER — EXENATIDE ER 2 MG ~~LOC~~ PEN: PEN_INJECTOR | SUBCUTANEOUS | Status: DC

## 2015-08-31 NOTE — Progress Notes (Signed)
   Subjective:    Patient ID: Emily Braun, female    DOB: [birth year], 40 y.o.   MRN: [id number]  HPI presents for her wellness exam. Regular cycles normal flow. Non smoker. Regular vision and dental exams. Limited activity. No new sexual partners. Off/on difficulty swallowing; has appointment with ENT in January; plans to discuss it at that time. Reflux stable on Protonix. No family history of thyroid cancer. No personal history of pancreatitis.     Review of Systems  Constitutional: Negative for activity change, appetite change and fatigue.  HENT: Positive for trouble swallowing. Negative for dental problem, ear pain, sinus pressure and sore throat.   Respiratory: Negative for cough, chest tightness, shortness of breath and wheezing.   Cardiovascular: Negative for chest pain.  Gastrointestinal: Negative for nausea, vomiting, abdominal pain, diarrhea, constipation and abdominal distention.  Genitourinary: Negative for dysuria, urgency, frequency, vaginal discharge, enuresis, difficulty urinating, genital sores, menstrual problem and pelvic pain.       Objective:   Physical Exam  Constitutional: She is oriented to person, place, and time. She appears well-developed. No distress.  HENT:  Right Ear: External ear normal.  Left Ear: External ear normal.  Mouth/Throat: Oropharynx is clear and moist.  Neck: Normal range of motion. Neck supple. No tracheal deviation present. No thyromegaly present.  Cardiovascular: Normal rate, regular rhythm and normal heart sounds.  Exam reveals no gallop.   No murmur heard. Pulmonary/Chest: Effort normal and breath sounds normal.  Abdominal: Soft. She exhibits no distension. There is no tenderness.  Genitourinary: Vagina normal and uterus normal. No vaginal discharge found.  External GU: no rashes or lesions. Vagina: no discharge. Bimanual exam: no tenderness or obvious masses; exam limited due to abd girth.   Musculoskeletal: She exhibits no edema.    Lymphadenopathy:    She has no cervical adenopathy.  Neurological: She is alert and oriented to person, place, and time.  Skin: Skin is warm and dry. No rash noted.  Psychiatric: She has a normal mood and affect. Her behavior is normal.  Vitals reviewed. Breast exam: dense tissue; no masses; axillae no adenopathy.  Reviewed labs with patient from 08/25/15: A1C 8.2.        Assessment & Plan:   Problem List Items Addressed This Visit      Endocrine   Type 2 diabetes mellitus without complication, without long-term current use of insulin (HCC)   Relevant Medications   Exenatide ER (BYDUREON) 2 MG PEN    Other Visit Diagnoses    Routine general medical examination at a health care facility    -  Primary      Plan:  Meds ordered this encounter  Medications  . Exenatide ER (BYDUREON) 2 MG PEN    Sig: Inject 2 mg subcu once a week    Dispense:  4 each    Refill:  5    Order Specific Question:  Supervising Provider    Answer:  Mikey Kirschner [[id number]]   Encouraged regular exercise, weight loss, and diet low in sugar and simple carbs. Discussed potential adverse effects of Bydureon. DC med and call if any problems.  Return in about 4 months (around 12/27/2015) for recheck.

## 2015-09-10 ENCOUNTER — Telehealth: Payer: Self-pay | Admitting: Family Medicine

## 2015-09-10 ENCOUNTER — Ambulatory Visit (INDEPENDENT_AMBULATORY_CARE_PROVIDER_SITE_OTHER): Payer: BLUE CROSS/BLUE SHIELD | Admitting: Otolaryngology

## 2015-09-10 DIAGNOSIS — J330 Polyp of nasal cavity: Secondary | ICD-10-CM | POA: Diagnosis not present

## 2015-09-10 DIAGNOSIS — J320 Chronic maxillary sinusitis: Secondary | ICD-10-CM | POA: Diagnosis not present

## 2015-09-10 NOTE — Telephone Encounter (Signed)
Patient says that anything she eats(even if its healthy), it makes her blood sugar in the 200's and she gets a really bad headache.  She has an appointment on 09/18/15 with Dr. Nicki Reaper, but feels like this may need to be addressed ASAP.  Please advise.

## 2015-09-10 NOTE — Telephone Encounter (Signed)
Advised patient that Dr Nicki Reaper stated to reassure the patient that sugars in the 200s are not critical but he could work  Her in 09/15/15 at 11 am. Patient stated she would keep her 09/18/15 appt.

## 2015-09-10 NOTE — Telephone Encounter (Signed)
Patient also advised to keep track of sugars and bring readings to appt.

## 2015-09-18 ENCOUNTER — Ambulatory Visit (INDEPENDENT_AMBULATORY_CARE_PROVIDER_SITE_OTHER): Payer: BLUE CROSS/BLUE SHIELD | Admitting: Family Medicine

## 2015-09-18 ENCOUNTER — Encounter: Payer: Self-pay | Admitting: Family Medicine

## 2015-09-18 VITALS — BP 124/82 | Ht 62.0 in | Wt 203.2 lb

## 2015-09-18 DIAGNOSIS — E119 Type 2 diabetes mellitus without complications: Secondary | ICD-10-CM

## 2015-09-18 DIAGNOSIS — I10 Essential (primary) hypertension: Secondary | ICD-10-CM

## 2015-09-18 MED ORDER — SITAGLIPTIN PHOSPHATE 100 MG PO TABS: 100.0000 mg | ORAL_TABLET | Freq: Every day | ORAL | Status: DC

## 2015-09-18 NOTE — Addendum Note (Signed)
Addended by: Carmelina Noun on: 09/18/2015 01:14 PM   Modules accepted: Orders

## 2015-09-18 NOTE — Progress Notes (Signed)
   Subjective:    Patient ID: Emily Braun, female    DOB: November 07, 1975, 40 y.o.   MRN: [id number]  Diabetes She presents for her follow-up diabetic visit. She has type 2 diabetes mellitus. No MedicAlert identification noted. Pertinent negatives for hypoglycemia include no confusion. Pertinent negatives for diabetes include no chest pain, no fatigue, no polydipsia, no polyphagia and no weakness. She has not had a previous visit with a dietitian. She does not see a podiatrist.Eye exam is not current.   Patient has c/o Bydureon causing itching and knots on skin at injection sites.  The patient is frustrated by the use sugar levels they're much higher than what she would like for them to be she has tried the recent regimen without much success. She is hoping that she can try something different.   Review of Systems  Constitutional: Negative for activity change, appetite change and fatigue.  HENT: Negative for congestion.   Respiratory: Negative for cough.   Cardiovascular: Negative for chest pain.  Gastrointestinal: Negative for abdominal pain.  Endocrine: Negative for polydipsia and polyphagia.  Neurological: Negative for weakness.  Psychiatric/Behavioral: Negative for confusion.       Objective:   Physical Exam  Constitutional: She appears well-nourished. No distress.  Cardiovascular: Normal rate, regular rhythm and normal heart sounds.   No murmur heard. Pulmonary/Chest: Effort normal and breath sounds normal. No respiratory distress.  Musculoskeletal: She exhibits no edema.  Lymphadenopathy:    She has no cervical adenopathy.  Neurological: She is alert. She exhibits normal muscle tone.  Psychiatric: Her behavior is normal.  Vitals reviewed.   25 minutes was spent with the patient. Greater than half the time was spent in discussion and answering questions and counseling regarding the issues that the patient came in for today.       Assessment & Plan:  1. Essential  hypertension, benign Blood pressure under good control diet rich in vegetables and fruits minimize starches minimize breads potatoes  2. Type 2 diabetes mellitus without complication, without long-term current use of insulin (HCC) Importance of physical activity and diet discussed. Referral to [facility] discussion held regarding multiple different choices that could be used for the diabetes area we will stop Bydureon, at the current moment she does not one to go on insulin. She will try increasing Januvia and see if this helps her numbers she will send [place] these readings in the next 7-10 days currently right now I do not feel she needs to see endocrinology but that is a distinct possibility we will be doing lab work again in 3 months time with follow-up office visit.  Elevated transaminase-probable fatty liver monitor closely if this becomes a trend she will need greater workup.

## 2015-09-21 ENCOUNTER — Encounter: Payer: Self-pay | Admitting: Family Medicine

## 2015-09-24 ENCOUNTER — Other Ambulatory Visit: Payer: Self-pay | Admitting: Nurse Practitioner

## 2015-10-04 ENCOUNTER — Telehealth: Payer: Self-pay | Admitting: Family Medicine

## 2015-10-04 NOTE — Telephone Encounter (Signed)
I reviewed over her morning glucose readings. Typically they are moderately elevated in the 140-170 range occasionally 137 occasionally 200-nurse's-Please let the patient know that it is fine for her to meet with dietitian before any decision will be made on insulin. I would recommend that the patient check a morning sugar 3 days a week. I also recommend that she check a glucose 2 hours after supper 3 days a week. Then send those readings to me in approximately 2-3 weeks.

## 2015-10-05 NOTE — Telephone Encounter (Signed)
[facility] 10/05/15 

## 2015-10-07 NOTE — Telephone Encounter (Signed)
Called and spoke with patient and informed her per Dr.Scott Luking-I reviewed over her morning glucose readings. Typically they are moderately elevated in the 140-170 range occasionally 137 occasionally 200's. it is fine for her to meet with dietitian before any decision will be made on insulin. I would recommend that the patient check a morning sugar 3 days a week. I also recommend that she check a glucose 2 hours after supper 3 days a week. Then send those readings to me in approximately 2-3 weeks. Patient verbalized understanding.

## 2015-10-08 ENCOUNTER — Encounter: Payer: BLUE CROSS/BLUE SHIELD | Attending: Family Medicine | Admitting: Nutrition

## 2015-10-08 ENCOUNTER — Encounter: Payer: Self-pay | Admitting: Nutrition

## 2015-10-08 VITALS — Ht 63.0 in | Wt 204.0 lb

## 2015-10-08 DIAGNOSIS — E119 Type 2 diabetes mellitus without complications: Secondary | ICD-10-CM | POA: Diagnosis not present

## 2015-10-08 DIAGNOSIS — E669 Obesity, unspecified: Secondary | ICD-10-CM

## 2015-10-08 DIAGNOSIS — IMO0002 Reserved for concepts with insufficient information to code with codable children: Secondary | ICD-10-CM

## 2015-10-08 DIAGNOSIS — E1165 Type 2 diabetes mellitus with hyperglycemia: Secondary | ICD-10-CM

## 2015-10-08 DIAGNOSIS — E118 Type 2 diabetes mellitus with unspecified complications: Secondary | ICD-10-CM

## 2015-10-08 NOTE — Progress Notes (Signed)
  Medical Nutrition Therapy:  Appt start time: 1600 end time:  1700.  Assessment:  Primary concerns today: Diabetes. A1C 8.2% in  Dec 2016. On Januvia 100 mg . Couldn't tolerate Metformin; caused diarrhea.eats out 50 %. Most foods are eaten in restaturants.  Eats 3 meals per day. Physical activity: walks some but not a lot.. Testing blood sugars in am. BS are 120-160's. Hasn't met with a RD before. Has cut back on all her carbs and she doesn't know why her BS in going up. Diet is inadequate in carbs and low in fresh fruits, vegetables and whole grains. Wants to lose weight. Complains of fatigue, increase cravings for sweets.   Preferred Learning Style:   No preference indicated   Learning Readiness:   Ready  Change in progress   MEDICATIONS: See list.   DIETARY INTAKE:  24-hr recall:  B ( AM):  Jimmy Dean low carb quiche, water Snk ( AM): none L ( PM) Stouffer low carb meal, water Snk ( PM): none D ( PM): chili from [employer] small, crackers 4 and sour cream, Water Snk ( PM): none: sometimes: veggie straws or peanuts or almonds Beverages: water.  Usual physical activity: walks a little.  Estimated energy needs: 1200 calories 135 g carbohydrates 90 g protein 33 g fat  Progress Towards Goal(s):  In progress.   Nutritional Diagnosis:  NB-1.1 Food and nutrition-related knowledge deficit As related to DM.  As evidenced by A1C 8.2%..    Intervention:  Nutrition and Diabetes education provided on My Plate, CHO counting, meal planning, portion sizes, timing of meals, avoiding snacks between meals unless having a low blood sugar, target ranges for A1C and blood sugars, signs/symptoms and treatment of hyper/hypoglycemia, monitoring blood sugars, taking medications as prescribed, benefits of exercising 30 minutes per day and prevention of complications of DM.  Goals: 1. Follow MY Plate Method 2. Increase fresh fruits and vegetables. 3. Eat 2-3 carb choices per meal. 4. Avoid  skipping meals. 5. Avoid snacks between meals or after supper. 6. Increase physical activity to 45 minutes 3-4 times per week. 7. Lose 1-2 lbs per week. 8. Get A1C down to 7.5% 9. Talk to MD again about retrying Metformin in small dose and taking after breakfast.  Teaching Method Utilized:  Visual Auditory Hands on  Handouts given during visit include:  The Plate Method  Meal Plan Card  Diabetes Instructions   Barriers to learning/adherence to lifestyle change: None  Demonstrated degree of understanding via:  Teach Back   Monitoring/Evaluation:  Dietary intake, exercise, meal planning, SBG, and body weight in 1 month(s).

## 2015-10-09 NOTE — Patient Instructions (Signed)
Goals: 1. Follow MY Plate Method 2. Increase fresh fruits and vegetables. 3. Eat 2-3 carb choices per meal. 4. Avoid skipping meals. 5. Avoid snacks between meals or after supper. 6. Increase physical activity to 45 minutes 3-4 times per week. 7. Lose 1-2 lbs per week. 8. Get A1C down to 7.5% 9. Talk to MD again about retrying Metformin in small dose and taking after breakfast.

## 2015-11-06 ENCOUNTER — Telehealth: Payer: Self-pay | Admitting: Family Medicine

## 2015-11-06 NOTE — Telephone Encounter (Signed)
Pt dropped off a blood sugar log. Message in box.

## 2015-11-10 NOTE — Telephone Encounter (Signed)
plz let me spk with pt. ( readings- fasting in 120 to 140 range, bedtime 124-200 range) (pt has appt at end of April)

## 2015-11-12 NOTE — Telephone Encounter (Signed)
[facility]

## 2015-11-16 ENCOUNTER — Encounter: Payer: Self-pay | Admitting: Family Medicine

## 2015-11-16 ENCOUNTER — Ambulatory Visit (INDEPENDENT_AMBULATORY_CARE_PROVIDER_SITE_OTHER): Payer: BLUE CROSS/BLUE SHIELD | Admitting: Family Medicine

## 2015-11-16 VITALS — Temp 98.7°F | Ht 62.0 in | Wt 203.4 lb

## 2015-11-16 DIAGNOSIS — B9689 Other specified bacterial agents as the cause of diseases classified elsewhere: Secondary | ICD-10-CM

## 2015-11-16 DIAGNOSIS — J019 Acute sinusitis, unspecified: Secondary | ICD-10-CM | POA: Diagnosis not present

## 2015-11-16 MED ORDER — CEFPROZIL 500 MG PO TABS: 500.0000 mg | ORAL_TABLET | Freq: Two times a day (BID) | ORAL | Status: DC

## 2015-11-16 NOTE — Telephone Encounter (Signed)
Patient to come in for a sick visit this afternoon with Dr.Scott Luking will discuss glucose readings with Dr.scott at this time.

## 2015-11-16 NOTE — Progress Notes (Signed)
   Subjective:    Patient ID: Emily Braun, female    DOB: 1976/04/01, 40 y.o.   MRN: [id number]  Cough This is a new problem. The current episode started 1 to 4 weeks ago. Associated symptoms include nasal congestion and wheezing. Treatments tried: otc cough meds.   Discuss sugar readingsMorning sugars around 120 later in the day around 140  Review of Systems  Respiratory: Positive for cough and wheezing.    Cough congestion drainage over the past several days is had about 2 weeks of a head cold. No wheezing or difficulty breathing.    Objective:   Physical Exam Mild sinus tenderness eardrums normal throat is normal neck supple lungs clear heart regular       Assessment & Plan:  Patient will keep her follow-up appointment for diabetes. If A1c not dramatically better may need to add Invokana or similar medicine  Patient will bring her blood sugars with her on follow-up  Viral illness along with sinusitis no sign of pneumonia

## 2015-11-19 ENCOUNTER — Encounter: Payer: Self-pay | Admitting: Nutrition

## 2015-11-19 ENCOUNTER — Encounter: Payer: BLUE CROSS/BLUE SHIELD | Attending: Family Medicine | Admitting: Nutrition

## 2015-11-19 VITALS — Ht 63.0 in | Wt 204.4 lb

## 2015-11-19 DIAGNOSIS — E669 Obesity, unspecified: Secondary | ICD-10-CM

## 2015-11-19 DIAGNOSIS — E119 Type 2 diabetes mellitus without complications: Secondary | ICD-10-CM | POA: Diagnosis not present

## 2015-11-19 DIAGNOSIS — E118 Type 2 diabetes mellitus with unspecified complications: Secondary | ICD-10-CM

## 2015-11-19 DIAGNOSIS — IMO0002 Reserved for concepts with insufficient information to code with codable children: Secondary | ICD-10-CM

## 2015-11-19 DIAGNOSIS — E1165 Type 2 diabetes mellitus with hyperglycemia: Secondary | ICD-10-CM

## 2015-11-19 NOTE — Patient Instructions (Addendum)
Goals: 1. Follow MY Plate Method 2. Increase fresh fruits and vegetables. 3. Eat 2-3 carb choices per meal. Don't eat past 7 pm. 4. Avoid snacks between meals or after supper. 6. Increase physical activity to  30-45 minutes 3-4 times per week. 7. Lose 1-2 lbs per week. 8. Get A1C down to 7.5% 9. Talk to MD again about retrying Metformin in small dose and taking after breakfast. 10. Talk to MD about Jardiance or Invokana added to your meds for weight loss and improved blood sugar control.

## 2015-11-19 NOTE — Progress Notes (Signed)
  Medical Nutrition Therapy:  Appt start time: 1600 end time:  1700.  Assessment:  Primary concerns today: Diabetes. A1C 8.2% in  Dec 2016. On Januvia 100 mg . Couldn't tolerate Metformin; caused diarrhea.eats out 50 %. Most foods are eaten in restaturants.  Eats 3 meals per day. Physical activity: walks some but not a lot.. Testing blood sugars in am. BS are 120-160's. Hasn't met with a RD before. Has cut back on all her carbs and she doesn't know why her BS in going up. Diet is inadequate in carbs and low in fresh fruits, vegetables and whole grains. Wants to lose weight. Complains of fatigue, increase cravings for sweets.   Preferred Learning Style:   No preference indicated   Learning Readiness:   Ready  Change in progress   MEDICATIONS: See list.   DIETARY INTAKE:  24-hr recall:  B ( AM):  Jimmy Dean low carb quiche, water Snk ( AM): none L ( PM) Stouffer low carb meal, water Snk ( PM): none D ( PM): chili from [employer] small, crackers 4 and sour cream, Water Snk ( PM): none: sometimes: veggie straws or peanuts or almonds Beverages: water.  Usual physical activity: walks a little.  Estimated energy needs: 1200 calories 135 g carbohydrates 90 g protein 33 g fat  Progress Towards Goal(s):  In progress.   Nutritional Diagnosis:  NB-1.1 Food and nutrition-related knowledge deficit As related to DM.  As evidenced by A1C 8.2%..    Intervention:  Nutrition and Diabetes education provided on My Plate, CHO counting, meal planning, portion sizes, timing of meals, avoiding snacks between meals unless having a low blood sugar, target ranges for A1C and blood sugars, signs/symptoms and treatment of hyper/hypoglycemia, monitoring blood sugars, taking medications as prescribed, benefits of exercising 30 minutes per day and prevention of complications of DM.  Goals: 1. Follow MY Plate Method 2. Increase fresh fruits and vegetables. 3. Eat 2-3 carb choices per meal. Don't eat past 7  pm. 4. Avoid snacks between meals or after supper. 6. Increase physical activity to  30-45 minutes 3-4 times per week. 7. Lose 1-2 lbs per week. 8. Get A1C down to 7.5% 9. Talk to MD again about retrying Metformin in small dose and taking after breakfast. 10. Talk to MD about Jardiance or Invokana added to your meds for weight loss and improved blood sugar control   GTeaching Method Utilized:  Visual Auditory Hands on  Handouts given during visit include:  The Plate Method  Meal Plan Card  Diabetes Instructions   Barriers to learning/adherence to lifestyle change: None  Demonstrated degree of understanding via:  Teach Back   Monitoring/Evaluation:  Dietary intake, exercise, meal planning, SBG, and body weight in 2-3 month(s).

## 2015-11-19 NOTE — Progress Notes (Signed)
  Medical Nutrition Therapy:  Appt start time: 1600 end time:  1700.  Assessment:  Primary concerns today: Diabetes. A1C 8.2% in  Dec 2016. Just got over being sick. Wt is the same as last visit.  BS are doing better. FBS 130-140's and Bedtime 130-170' but a few 200's when she eats too many carbs.  She knows her when her BS is up is due to excessive carbs. Admits to stress eating at night. On Januvia 100 mg . Working on eating better balanced meals, cutting out snacks and drinking water. Wants to start exercising again now that she is getting better. Meal consistency and timing of meals is something she still needs to work on. Needs to avoid eating after 7 pm.  Diet is inadequate in carbs and low in fresh fruits, vegetables and whole grains. Wants to lose weight. Complains of fatigue, increase cravings for sweets.   Preferred Learning Style:   No preference indicated   Learning Readiness:   Ready  Change in progress   MEDICATIONS: See list.   DIETARY INTAKE:  24-hr recall:  B ( AM):  Jimmy Dean low carb quiche, water Snk ( AM): none L ( PM) Stouffer low carb meal, water Snk ( PM): none D ( PM): Chiicken sandwich, toss salad, honey mustard dressing,  water Snk ( PM): Beverages: water.  Usual physical activity: walks a little.  Estimated energy needs: 1200 calories 135 g carbohydrates 90 g protein 33 g fat  Progress Towards Goal(s):  In progress.   Nutritional Diagnosis:  NB-1.1 Food and nutrition-related knowledge deficit As related to DM.  As evidenced by A1C 8.2%..    Intervention:  Nutrition and Diabetes education provided on My Plate, CHO counting, meal planning, portion sizes, timing of meals, avoiding snacks between meals unless having a low blood sugar, target ranges for A1C and blood sugars, signs/symptoms and treatment of hyper/hypoglycemia, monitoring blood sugars, taking medications as prescribed, benefits of exercising 30 minutes per day and prevention of  complications of DM.  Goals: 1. Follow MY Plate Method 2. Increase fresh fruits and vegetables. 3. Eat 2-3 carb choices per meal. 4. Avoid skipping meals. 5. Avoid snacks between meals or after supper. 6. Increase physical activity to 45 minutes 3-4 times per week. 7. Lose 1-2 lbs per week. 8. Get A1C down to 7.5% 9. Talk to MD again about retrying Metformin in small dose and taking after breakfast.  Teaching Method Utilized:  Visual Auditory Hands on  Handouts given during visit include:  The Plate Method  Meal Plan Card  Diabetes Instructions   Barriers to learning/adherence to lifestyle change: None  Demonstrated degree of understanding via:  Teach Back   Monitoring/Evaluation:  Dietary intake, exercise, meal planning, SBG, and body weight in 1 month(s).

## 2015-11-19 NOTE — Progress Notes (Signed)
  Medical Nutrition Therapy:  Appt start time: 1600 end time:  1700.  Assessment:  Primary concerns today: Diabetes. A1C 8.2% in  Dec 2016. On Januvia 100 mg .  FBS avg 130-140's and bedtime 127-210 mg/dl. Changes made: Working on portion control and trying to balance carbs. Has bene sick recently and hasn't been able to exercise like she wanted to. Testing twice a day. Sees Dr. Wolfgang Phoenix  April 2017. Fatigue is better and has less cravings of sweets. Admits to stress eating sweets.  Preferred Learning Style:   No preference indicated   Learning Readiness:   Ready  Change in progress   MEDICATIONS: See list.   DIETARY INTAKE:  24-hr recall:  B ( AM):  Consulting civil engineer egg mcmuffin-low carb lite Snk ( AM): none L ( PM) Grilled chicken sandwich and salad - honey mustard dressing,  water Snk ( PM): none D ( PM):  Lean cuisine dinner, water Snk ( PM):  Beverages: water.  Usual physical activity: walks a little.  Estimated energy needs: 1200 calories 135 g carbohydrates 90 g protein 33 g fat  Progress Towards Goal(s):  In progress.   Nutritional Diagnosis:  NB-1.1 Food and nutrition-related knowledge deficit As related to DM.  As evidenced by A1C 8.2%..    Intervention:  Nutrition and Diabetes education provided on My Plate, CHO counting, meal planning, portion sizes, timing of meals, avoiding snacks between meals unless having a low blood sugar, target ranges for A1C and blood sugars, signs/symptoms and treatment of hyper/hypoglycemia, monitoring blood sugars, taking medications as prescribed, benefits of exercising 30 minutes per day and prevention of complications of DM.  Goals: 1. Follow MY Plate Method 2. Increase fresh fruits and vegetables. 3. Eat 2-3 carb choices per meal. 4. Avoid snacks between meals or after supper. 6. Increase physical activity to  30-45 minutes 3-4 times per week. 7. Lose 1-2 lbs per week. 8. Get A1C down to 7.5% 9. Talk to MD again about retrying  Metformin in small dose and taking after breakfast. 10. Talk to MD about Jardiance or Invokana added to your meds for weight loss and improved blood sugar control.   Teaching Method Utilized:  Visual Auditory Hands on  Handouts given during visit include:  The Plate Method  Meal Plan Card  Diabetes Instructions   Barriers to learning/adherence to lifestyle change: None  Demonstrated degree of understanding via:  Teach Back   Monitoring/Evaluation:  Dietary intake, exercise, meal planning, SBG, and body weight in 1-2 month(s).

## 2015-12-18 ENCOUNTER — Ambulatory Visit (INDEPENDENT_AMBULATORY_CARE_PROVIDER_SITE_OTHER): Payer: BLUE CROSS/BLUE SHIELD | Admitting: Family Medicine

## 2015-12-18 ENCOUNTER — Encounter: Payer: Self-pay | Admitting: Family Medicine

## 2015-12-18 VITALS — BP 130/78 | Ht 62.0 in | Wt 205.2 lb

## 2015-12-18 DIAGNOSIS — E119 Type 2 diabetes mellitus without complications: Secondary | ICD-10-CM | POA: Diagnosis not present

## 2015-12-18 DIAGNOSIS — I10 Essential (primary) hypertension: Secondary | ICD-10-CM

## 2015-12-18 LAB — POCT GLYCOSYLATED HEMOGLOBIN (HGB A1C): Hemoglobin A1C: 7.2

## 2015-12-18 NOTE — Progress Notes (Signed)
   Subjective:    Patient ID: Emily Braun, female    DOB: Aug 18, 1976, 40 y.o.   MRN: [id number]  Diabetes She presents for her initial diabetic visit. She has type 2 diabetes mellitus. Risk factors for coronary artery disease include diabetes mellitus, dyslipidemia and hypertension. Current diabetic treatment includes oral agent (monotherapy). She is compliant with treatment all of the time. Her weight is stable. She is following a diabetic diet. Eye exam is current.   Results for orders placed or performed in visit on 12/18/15  POCT glycosylated hemoglobin (Hb A1C)  Result Value Ref Range   Hemoglobin A1C 7.2    A1c actually doing much better we had a long discussion greater than 15 minutes to discussing her diabetes importance of exercise including strength related exercises plus also the importance of watching fats and sugars in the diet   Review of Systems Patient denies excessive thirst urination denies chest tightness pressure pain shortness breath no fevers    Objective:   Physical Exam  Lungs clear heart regular pulse normal extremities no edema      Assessment & Plan:  Patient try to lose weight Patient will follow-up in approximate 3-4 months comprehensive lab work at that time Goal is to get A1c down to 6.5 or less Follow-up if any other problems

## 2016-01-10 ENCOUNTER — Other Ambulatory Visit: Payer: Self-pay | Admitting: Family Medicine

## 2016-01-13 ENCOUNTER — Telehealth: Payer: Self-pay | Admitting: Nutrition

## 2016-01-13 ENCOUNTER — Ambulatory Visit: Payer: BLUE CROSS/BLUE SHIELD | Admitting: Nutrition

## 2016-01-13 NOTE — Telephone Encounter (Signed)
Reminder vm

## 2016-01-21 ENCOUNTER — Other Ambulatory Visit: Payer: Self-pay | Admitting: Family Medicine

## 2016-02-08 ENCOUNTER — Other Ambulatory Visit: Payer: Self-pay | Admitting: Family Medicine

## 2016-02-08 NOTE — Telephone Encounter (Signed)
90 d worth for all

## 2016-02-08 NOTE — Telephone Encounter (Signed)
May we refill Citalopram? 

## 2016-02-22 ENCOUNTER — Other Ambulatory Visit: Payer: Self-pay | Admitting: Family Medicine

## 2016-03-10 ENCOUNTER — Ambulatory Visit (INDEPENDENT_AMBULATORY_CARE_PROVIDER_SITE_OTHER): Payer: BLUE CROSS/BLUE SHIELD | Admitting: Otolaryngology

## 2016-03-10 DIAGNOSIS — J320 Chronic maxillary sinusitis: Secondary | ICD-10-CM | POA: Diagnosis not present

## 2016-03-10 DIAGNOSIS — J330 Polyp of nasal cavity: Secondary | ICD-10-CM

## 2016-03-25 ENCOUNTER — Other Ambulatory Visit: Payer: Self-pay | Admitting: Family Medicine

## 2016-04-22 ENCOUNTER — Ambulatory Visit: Payer: Self-pay | Admitting: Family Medicine

## 2016-04-27 ENCOUNTER — Telehealth: Payer: Self-pay | Admitting: Family Medicine

## 2016-04-27 NOTE — Telephone Encounter (Signed)
I recommend Tradjenta 5 mg 1 daily, #30, 5 refills. Keep regular follow-ups

## 2016-04-27 NOTE — Telephone Encounter (Signed)
Pt is needing something other than [place] called in due to a change in her insurance. Her current insurance will not cover [place]. Pt states that her pharmacy sent over something regarding this via fax.

## 2016-04-28 MED ORDER — LINAGLIPTIN 5 MG PO TABS: 5.0000 mg | ORAL_TABLET | Freq: Every day | ORAL | 5 refills | Status: DC

## 2016-04-28 NOTE — Telephone Encounter (Signed)
Prescription sent electronically to pharmacy. Patient notified. 

## 2016-05-11 ENCOUNTER — Other Ambulatory Visit: Payer: Self-pay | Admitting: Family Medicine

## 2016-05-13 ENCOUNTER — Encounter: Payer: Self-pay | Admitting: Gastroenterology

## 2016-05-13 ENCOUNTER — Ambulatory Visit (INDEPENDENT_AMBULATORY_CARE_PROVIDER_SITE_OTHER): Payer: 59 | Admitting: Family Medicine

## 2016-05-13 ENCOUNTER — Encounter: Payer: Self-pay | Admitting: Family Medicine

## 2016-05-13 VITALS — BP 112/70 | Ht 62.0 in | Wt 199.0 lb

## 2016-05-13 DIAGNOSIS — E119 Type 2 diabetes mellitus without complications: Secondary | ICD-10-CM | POA: Diagnosis not present

## 2016-05-13 DIAGNOSIS — Z79899 Other long term (current) drug therapy: Secondary | ICD-10-CM | POA: Diagnosis not present

## 2016-05-13 DIAGNOSIS — I10 Essential (primary) hypertension: Secondary | ICD-10-CM

## 2016-05-13 DIAGNOSIS — R1310 Dysphagia, unspecified: Secondary | ICD-10-CM

## 2016-05-13 DIAGNOSIS — Z13220 Encounter for screening for lipoid disorders: Secondary | ICD-10-CM

## 2016-05-13 LAB — POCT GLYCOSYLATED HEMOGLOBIN (HGB A1C): HEMOGLOBIN A1C: 9.2

## 2016-05-13 MED ORDER — SITAGLIPTIN PHOSPHATE 100 MG PO TABS: 100.0000 mg | ORAL_TABLET | Freq: Every day | ORAL | 5 refills | Status: DC

## 2016-05-13 NOTE — Progress Notes (Signed)
   Subjective:    Patient ID: Emily Braun, female    DOB: Oct 22, 1975, 40 y.o.   MRN: [id number]  Diabetes  She presents for her follow-up diabetic visit. She has type 2 diabetes mellitus. She is compliant with treatment all of the time. Home blood sugar record trend: does not check blood sugar often. She does not see a podiatrist.Eye exam is current.   Concerns about reflux. Feels like food is getting stuck in throat.She relates when she swallows he gets stuck in the top one third of her throat she is seen ENT in the past and they did not really seem to think anything of it patient states she's had a couple times where she truly felt she was choking and could not swallow. She was able to dispel the food.  She denies any severe esophagitis issues she admits she stress eats and has gained weight recently.  She does state she's taking in too many starches she understands importance of getting her A1c under better control. Patient with history of blood pressure taken her medicine keeps it under control. History reflux takes her medicine tries to keep it under control  History of anxiety with depression like symptoms does well with citalopram  Review of Systems Denies chest tightness pressure pain does relate dysphagia denies nausea vomiting diarrhea no fevers no headaches    Objective:   Physical Exam  HEENT is benign neck no masses throat appears normal lungs are clear no crackle heart regular pulse normal diabetic foot exam normal      Assessment & Plan:  Diabetes subpar control work on diet try to lose weight exercise more in addition this which did Januvia 100 mg daily if not doing better with this may consider Jardiance being added. Patient does not tolerate metformin. Does not one to go on insulin currently. She will check her sugars frequently and send [place] a results Hyperlipidemia watch diet closely previous lab work reviewed Blood pressure good control continue current  measures Dysphagia referral to gastroenterology for further evaluation possible EGD Patient to follow-up 3-4 months check A1c at that time may need additional medicines She will send [place] her sugars in a few weeks May need additional medicine Extensive blood work in one month's time patient will follow-up in 3-4 months time to recheck A1c

## 2016-05-17 ENCOUNTER — Telehealth: Payer: Self-pay | Admitting: *Deleted

## 2016-05-17 NOTE — Telephone Encounter (Signed)
Pt would like another referral to a different gi because she does not want to wait until December to be seen.

## 2016-05-17 NOTE — Telephone Encounter (Signed)
Emily Braun-please work with the patient. Find out if she would rather go to [facility] or locally-proceed accordingly

## 2016-05-18 ENCOUNTER — Ambulatory Visit (INDEPENDENT_AMBULATORY_CARE_PROVIDER_SITE_OTHER): Payer: 59 | Admitting: Family Medicine

## 2016-05-18 ENCOUNTER — Encounter: Payer: Self-pay | Admitting: Family Medicine

## 2016-05-18 ENCOUNTER — Other Ambulatory Visit: Payer: Self-pay | Admitting: Family Medicine

## 2016-05-18 VITALS — BP 110/80 | Ht 62.0 in | Wt 198.0 lb

## 2016-05-18 DIAGNOSIS — E1165 Type 2 diabetes mellitus with hyperglycemia: Secondary | ICD-10-CM

## 2016-05-18 DIAGNOSIS — E119 Type 2 diabetes mellitus without complications: Secondary | ICD-10-CM

## 2016-05-18 MED ORDER — GLIPIZIDE 5 MG PO TABS
5.0000 mg | ORAL_TABLET | Freq: Two times a day (BID) | ORAL | 3 refills | Status: DC
Start: 2016-05-18 — End: 2016-06-20

## 2016-05-18 NOTE — Progress Notes (Signed)
   Subjective:    Patient ID: DEITRA VATER, female    DOB: 1976-06-15, 40 y.o.   MRN: [id number]  HPI Patient is here today because her blood sugars are still running high. Patient has no other concerns at this time.  Patient sugars running over 200 and warning sometimes close to 300 later in the day she is only on Tradjenta this is been switched to Januvia because Tradjenta cause side effects. Patient does not want to be on insulin shots currently she has tried Bydureon without success had side effects with it  Review of Systems     Objective:   Physical Exam  Lungs clear heart regular      Assessment & Plan:  Long discussion held with patient regarding her diabetes. Upon this discussion go ahead with glipizide 5 mg twice daily continue Tradjenta until Januvia is received. Possibly will need to be on long-acting insulin at nighttime depending on her sugar readings she will send [place] some readings in approximately 1-2 weeks time further decisions based upon the

## 2016-05-20 ENCOUNTER — Telehealth: Payer: Self-pay | Admitting: *Deleted

## 2016-05-20 NOTE — Telephone Encounter (Signed)
Prior approval request for JANUVIA has been DENIED. Copy of denial form in yellow folder

## 2016-05-22 ENCOUNTER — Telehealth: Payer: Self-pay | Admitting: Family Medicine

## 2016-05-22 NOTE — Telephone Encounter (Signed)
Please inform the patient that Emily Braun is not covered even with the appeal.Onglyza is covered which works the same way as [place] patient is interested we can send this in. Cancel Januvia from her medication list

## 2016-05-23 NOTE — Telephone Encounter (Signed)
Left message to return call 

## 2016-05-23 NOTE — Telephone Encounter (Signed)
This was handled in a different phone message 

## 2016-05-24 MED ORDER — SAXAGLIPTIN HCL 5 MG PO TABS: 5.0000 mg | ORAL_TABLET | Freq: Every day | ORAL | 5 refills | Status: DC

## 2016-05-24 NOTE — Telephone Encounter (Signed)
Medication sent electronically to pharmacy. Patient notified.

## 2016-05-24 NOTE — Telephone Encounter (Signed)
Advised patient that Emily Braun is not covered even with the appeal.Onglyza is covered which works the same way as [place] patient is interested we can send this in. Patient verbalized understanding and would like to try the medicine. Patient states her fasting sugars ar running around 160

## 2016-05-24 NOTE — Telephone Encounter (Signed)
Onglyza 5 mg 1 qd 30 day prescription 5 refills-send glucose readings within the next 2 weeks follow-up office visit within 3 months to check A1c

## 2016-05-25 ENCOUNTER — Telehealth: Payer: Self-pay | Admitting: Family Medicine

## 2016-05-25 NOTE — Telephone Encounter (Signed)
Keep on current approach. Send more readings within 2 weeks

## 2016-05-25 NOTE — Telephone Encounter (Signed)
Please review the blood sugar log that patient faxed over.

## 2016-05-26 NOTE — Telephone Encounter (Signed)
Advised patient Dr Nicki Reaper advises to keep on current approach. Send more readings within 2 weeks. Patient verbalized understanding.

## 2016-06-14 LAB — BASIC METABOLIC PANEL
BUN / CREAT RATIO: 22 (ref 9–23)
BUN: 15 mg/dL (ref 6–20)
CO2: 26 mmol/L (ref 18–29)
CREATININE: 0.69 mg/dL (ref 0.57–1.00)
Calcium: 9.8 mg/dL (ref 8.7–10.2)
Chloride: 96 mmol/L (ref 96–106)
GFR calc Af Amer: 127 mL/min/{1.73_m2} (ref >59)
GFR, EST NON AFRICAN AMERICAN: 110 mL/min/{1.73_m2} (ref >59)
Glucose: 240 mg/dL — ABNORMAL HIGH (ref 65–99)
Potassium: 4.3 mmol/L (ref 3.5–5.2)
SODIUM: 136 mmol/L (ref 134–144)

## 2016-06-14 LAB — HEPATIC FUNCTION PANEL
ALBUMIN: 4.3 g/dL (ref 3.5–5.5)
ALK PHOS: 63 IU/L (ref 39–117)
ALT: 20 IU/L (ref 0–32)
AST: 25 IU/L (ref 0–40)
BILIRUBIN TOTAL: 0.3 mg/dL (ref 0.0–1.2)
BILIRUBIN, DIRECT: 0.1 mg/dL (ref 0.00–0.40)
TOTAL PROTEIN: 6.8 g/dL (ref 6.0–8.5)

## 2016-06-14 LAB — MICROALBUMIN / CREATININE URINE RATIO
CREATININE, UR: 96.4 mg/dL
MICROALB/CREAT RATIO: 5.6 mg/g{creat} (ref 0.0–30.0)
MICROALBUM., U, RANDOM: 5.4 ug/mL

## 2016-06-14 LAB — LIPID PANEL
Chol/HDL Ratio: 2.7 ratio units (ref 0.0–4.4)
Cholesterol, Total: 101 mg/dL (ref 100–199)
HDL: 37 mg/dL — ABNORMAL LOW (ref >39)
LDL Calculated: 44 mg/dL (ref 0–99)
Triglycerides: 98 mg/dL (ref 0–149)
VLDL Cholesterol Cal: 20 mg/dL (ref 5–40)

## 2016-06-17 ENCOUNTER — Encounter: Payer: Self-pay | Admitting: Family Medicine

## 2016-06-17 ENCOUNTER — Ambulatory Visit (INDEPENDENT_AMBULATORY_CARE_PROVIDER_SITE_OTHER): Payer: 59 | Admitting: Family Medicine

## 2016-06-17 VITALS — BP 112/80 | Ht 62.0 in | Wt 198.5 lb

## 2016-06-17 DIAGNOSIS — E119 Type 2 diabetes mellitus without complications: Secondary | ICD-10-CM

## 2016-06-17 NOTE — Progress Notes (Signed)
   Subjective:    Patient ID: Emily Braun, female    DOB: 1976/01/20, 40 y.o.   MRN: [id number]  Diabetes  She presents for her follow-up diabetic visit. She has type 2 diabetes mellitus. There are no hypoglycemic associated symptoms. Pertinent negatives for hypoglycemia include no confusion. There are no diabetic associated symptoms. Pertinent negatives for diabetes include no chest pain, no fatigue, no polydipsia, no polyphagia and no weakness. There are no hypoglycemic complications. There are no diabetic complications. Current diabetic treatment includes oral agent (dual therapy). She is compliant with treatment all of the time.   Patient states that she has no new concerns at this time.  Patient just had diabetic check up last month, just here to follow up from that visit.   Review of Systems  Constitutional: Negative for activity change, appetite change and fatigue.  HENT: Negative for congestion.   Respiratory: Negative for cough.   Cardiovascular: Negative for chest pain.  Gastrointestinal: Negative for abdominal pain.  Endocrine: Negative for polydipsia and polyphagia.  Neurological: Negative for weakness.  Psychiatric/Behavioral: Negative for confusion.       Objective:   Physical Exam  Constitutional: She appears well-nourished. No distress.  Cardiovascular: Normal rate, regular rhythm and normal heart sounds.   No murmur heard. Pulmonary/Chest: Effort normal and breath sounds normal. No respiratory distress.  Musculoskeletal: She exhibits no edema.  Lymphadenopathy:    She has no cervical adenopathy.  Neurological: She is alert. She exhibits normal muscle tone.  Psychiatric: Her behavior is normal.  Vitals reviewed.         Assessment & Plan:  Diabetes subpar control sugars are running in the 150-170 range in the morning and anywhere from 150-200 later in the day we will increase the glipizide one half tablet twice a day she will send [place] readings in a couple  weeks if that's not doing well enough were increase it to 2 tablets twice a day then the next step would be potentially adding Jardiance. Patient was encouraged to follow-up in January for A1c regular exercise watching diet encouraged

## 2016-06-20 ENCOUNTER — Encounter: Payer: Self-pay | Admitting: Family Medicine

## 2016-06-20 ENCOUNTER — Other Ambulatory Visit: Payer: Self-pay

## 2016-06-20 MED ORDER — GLIPIZIDE 5 MG PO TABS: 10.0000 mg | ORAL_TABLET | Freq: Two times a day (BID) | ORAL | 5 refills | Status: DC

## 2016-06-20 NOTE — Telephone Encounter (Signed)
Emily Braun-I will have our staff send this over. It will say on the prescription 2 tablets twice daily but you know to use 1-1/2 tablet twice daily initially send [place] some readings within 2 weeks thanks-.sal Nurse's-please see medication order on routing message

## 2016-06-23 ENCOUNTER — Other Ambulatory Visit: Payer: Self-pay | Admitting: Family Medicine

## 2016-06-27 ENCOUNTER — Encounter: Payer: Self-pay | Admitting: Family Medicine

## 2016-06-28 NOTE — Telephone Encounter (Signed)
Nurses please see the message from the patient. It is fine to send in Emily Braun-please do me a favor and talk with [place] find out if this medication is a 90 day tight birth control pill or 30 day? She may have 6 months either way. Let me know thank you inform the patient is well thank you

## 2016-06-29 ENCOUNTER — Other Ambulatory Visit: Payer: Self-pay | Admitting: *Deleted

## 2016-06-29 MED ORDER — LEVONORGESTREL-ETHINYL ESTRAD 0.1-20 MG-MCG PO TABS: 1.0000 | ORAL_TABLET | Freq: Every day | ORAL | 5 refills | Status: DC

## 2016-06-29 NOTE — Telephone Encounter (Signed)
See addendum

## 2016-07-29 ENCOUNTER — Encounter: Payer: Self-pay | Admitting: Gastroenterology

## 2016-07-29 ENCOUNTER — Ambulatory Visit (INDEPENDENT_AMBULATORY_CARE_PROVIDER_SITE_OTHER): Payer: 59 | Admitting: Gastroenterology

## 2016-07-29 VITALS — BP 134/76 | HR 88 | Ht 63.0 in | Wt 202.0 lb

## 2016-07-29 DIAGNOSIS — K219 Gastro-esophageal reflux disease without esophagitis: Secondary | ICD-10-CM

## 2016-07-29 DIAGNOSIS — R1310 Dysphagia, unspecified: Secondary | ICD-10-CM

## 2016-07-29 DIAGNOSIS — R1319 Other dysphagia: Secondary | ICD-10-CM

## 2016-07-29 NOTE — Progress Notes (Signed)
[facility] Consult Note:  History: Emily Braun 07/29/2016  Referring physician: Sallee Lange, MD  Reason for consult/chief complaint: Dysphagia (Solid food dysphagia that has progressively worse in the last few months.) and Gastroesophageal Reflux   Subjective  HPI:  Prior Emily Braun (5/14) for IBS and rectal bleeding .  Colonoscopy - small HR Emily Braun has had several years of chronic GERD symptoms under control once daily Protonix. She previously had suboptimal control with ranitidine. About 2 years ago she had an episode of what sounds like a food impaction that she says required a Heimlich maneuver. In the last 6 months she has had intermittent episodes of solid food dysphagia, usually bread or meat feeling stuck in the mid to upper chest. It will then pass on its own or perhaps after she drinks some liquid. She has some allergic sinus congestion but no other chronic allergy symptoms. Her appetite been good and her weight stable. Emily Braun says that she has learned to live with her IBS symptoms and they are manageable if she avoid certain food triggers. She denies rectal bleeding. ROS:  Review of Systems  Constitutional: Negative for appetite change and unexpected weight change.  HENT: Negative for mouth sores and voice change.   Eyes: Negative for pain and redness.  Respiratory: Negative for cough and shortness of breath.   Cardiovascular: Negative for chest pain and palpitations.  Genitourinary: Negative for dysuria and hematuria.  Musculoskeletal: Negative for arthralgias and myalgias.  Skin: Negative for pallor and rash.  Neurological: Negative for weakness and headaches.  Hematological: Negative for adenopathy.     Past Medical History: Past Medical History:  Diagnosis Date  . Anxiety   . Depression   . GERD (gastroesophageal reflux disease)   . Hypertension    under control with med., has been on med. x 1 yr.  . Left maxillary sinusitis 02/2014  .  Seasonal allergies      Past Surgical History: Past Surgical History:  Procedure Laterality Date  . CHOLECYSTECTOMY  09/10/2007  . COLONOSCOPY WITH PROPOFOL  02/22/2013  . MAXILLARY ANTROSTOMY Left 03/10/2014   Procedure: LEFT MAXILLARY ANTROSTOMY;  Surgeon: Ascencion Dike, MD;  Location: [facility];  Service: ENT;  Laterality: Left;  . ORIF PATELLA FRACTURE Left   . WISDOM TOOTH EXTRACTION       Family History: Family History  Problem Relation Age of Onset  . Diabetes Father   . Hypertension Father   . Diabetes Maternal Grandmother   . Diabetes Paternal Grandmother   . Hypertension Mother   . Cancer - Other Brother     testicular    Social History: Social History   Social History  . Marital status: Single    Spouse name: N/A  . Number of children: N/A  . Years of education: N/A   Occupational History  . 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 Smoking status: Never Smoker  . Smokeless tobacco: Never Used  . Alcohol use Yes     Comment: socially  . Drug use: No  . Sexual activity: Not Asked   Other Topics Concern  . None   Social History Narrative  . None    Allergies: Allergies  Allergen Reactions  . Bydureon [Exenatide]     Caused knots in her skin as well as itching at the site of injection  . Metformin And Related Diarrhea  . Tradjenta [Linagliptin]     Patient relates  abdominal cramps and diarrhea    Outpatient Meds: Current Outpatient Prescriptions  Medication Sig Dispense Refill  . Calcium Carbonate-Vitamin D (CALCIUM + D PO) Take 600 mg by mouth daily.    . citalopram (CELEXA) 20 MG tablet TAKE ONE-HALF TO ONE TABLET BY MOUTH ONCE DAILY 90 tablet 0  . fluticasone (FLONASE) 50 MCG/ACT nasal spray USE TWO SPRAY(S) IN EACH NOSTRIL ONCE DAILY 16 g 3  . glipiZIDE (GLUCOTROL) 5 MG tablet Take 2 tablets (10 mg total) by mouth 2 (two) times daily before a meal. 120 tablet 5  .  levonorgestrel-ethinyl estradiol (AVIANE,ALESSE,LESSINA) 0.1-20 MG-MCG tablet Take 1 tablet by mouth daily. 1 Package 5  . lisinopril (PRINIVIL,ZESTRIL) 10 MG tablet TAKE ONE TABLET BY MOUTH ONCE DAILY 90 tablet 0  . loratadine (CLARITIN) 10 MG tablet Take 10 mg by mouth daily.    . pantoprazole (PROTONIX) 40 MG tablet TAKE ONE TABLET BY MOUTH ONCE DAILY FOR  ACID  REFLUX 30 tablet 5  . saxagliptin HCl (ONGLYZA) 5 MG TABS tablet Take 1 tablet (5 mg total) by mouth daily. 30 tablet 5   No current facility-administered medications for this visit.       ___________________________________________________________________ Objective   Exam:  BP 134/76   Pulse 88   Ht 5\' 3"  (1.6 m)   Wt 202 lb (91.6 kg)   BMI 35.78 kg/m    General: this is a(n) Overweight, well-appearing woman   Eyes: sclera anicteric, no redness  ENT: oral mucosa moist without lesions, no cervical or supraclavicular lymphadenopathy, good dentition  CV: RRR without murmur, S1/S2, no JVD, no peripheral edema  Resp: clear to auscultation bilaterally, normal RR and effort noted  GI: soft, no tenderness, with active bowel sounds. No guarding or palpable organomegaly noted.  Skin; warm and dry, no rash or jaundice noted  Neuro: awake, alert and oriented x 3. Normal gross motor function and fluent speech   Assessment: Encounter Diagnoses  Name Primary?  . Esophageal dysphagia Yes  . Gastroesophageal reflux disease, esophagitis presence not specified   It sounds likely mechanical such as a ring or stricture, less likely eosinophilic esophagitis.  Plan:  EGD with probable dilatation. After that we will discuss the escalation of PPI therapy.  Thank you for the courtesy of this consult.  Please call me with any questions or concerns.  Emily Braun III  CC: Sallee Lange, MD

## 2016-07-29 NOTE — Patient Instructions (Signed)
If you are age 39 or older, your body mass index should be between 23-30. Your Body mass index is 35.78 kg/m. If this is out of the aforementioned range listed, please consider follow up with your Primary Care Provider.  If you are age 54 or younger, your body mass index should be between 19-25. Your Body mass index is 35.78 kg/m. If this is out of the aformentioned range listed, please consider follow up with your Primary Care Provider.   You have been scheduled for an endoscopy. Please follow written instructions given to you at your visit today. If you use inhalers (even only as needed), please bring them with you on the day of your procedure. Your physician has requested that you go to www.startemmi.com and enter the access code given to you at your visit today. This web site gives a general overview about your procedure. However, you should still follow specific instructions given to you by our office regarding your preparation for the procedure.  Thank you for choosing [facility]  Dr Wilfrid Lund III

## 2016-08-08 ENCOUNTER — Encounter: Payer: Self-pay | Admitting: Family Medicine

## 2016-08-08 ENCOUNTER — Ambulatory Visit (AMBULATORY_SURGERY_CENTER): Payer: 59 | Admitting: Gastroenterology

## 2016-08-08 ENCOUNTER — Encounter: Payer: Self-pay | Admitting: Gastroenterology

## 2016-08-08 VITALS — BP 127/74 | HR 81 | Temp 98.6°F | Resp 26 | Ht 63.0 in | Wt 202.0 lb

## 2016-08-08 DIAGNOSIS — R1310 Dysphagia, unspecified: Secondary | ICD-10-CM

## 2016-08-08 DIAGNOSIS — R1319 Other dysphagia: Secondary | ICD-10-CM

## 2016-08-08 LAB — GLUCOSE, CAPILLARY
GLUCOSE-CAPILLARY: 251 mg/dL — AB (ref 65–99)
GLUCOSE-CAPILLARY: 203 mg/dL — AB (ref 65–99)

## 2016-08-08 MED ORDER — SODIUM CHLORIDE 0.9 % IV SOLN: 500.0000 mL | INTRAVENOUS | Status: DC

## 2016-08-08 NOTE — Progress Notes (Signed)
Called to room to assist during endoscopic procedure.  Patient ID and intended procedure confirmed with present staff. Received instructions for my participation in the procedure from the performing physician.  

## 2016-08-08 NOTE — Op Note (Signed)
[facility] Patient Name: Emily Braun Procedure Date: 08/08/2016 11:48 AM MRN: [id number] Endoscopist: Mallie Mussel L. Loletha Carrow , MD Age: 40 Referring MD:  Date of Birth: 1975/12/22 Gender: Female Account #: [id number] Procedure:                Upper GI endoscopy Indications:              Dysphagia Medicines:                Monitored Anesthesia Care Procedure:                Pre-Anesthesia Assessment:                           - Prior to the procedure, a History and Physical                            was performed, and patient medications and                            allergies were reviewed. The patient's tolerance of                            previous anesthesia was also reviewed. The risks                            and benefits of the procedure and the sedation                            options and risks were discussed with the patient.                            All questions were answered, and informed consent                            was obtained. Prior Anticoagulants: The patient has                            taken no previous anticoagulant or antiplatelet                            agents. ASA Grade Assessment: II - A patient with                            mild systemic disease. After reviewing the risks                            and benefits, the patient was deemed in                            satisfactory condition to undergo the procedure.                           After obtaining informed consent, the endoscope was  passed under direct vision. Throughout the                            procedure, the patient's blood pressure, pulse, and                            oxygen saturations were monitored continuously. The                            Model GIF-HQ190 [id number]) scope was introduced                            through the mouth, and advanced to the second part                            of duodenum. The upper GI endoscopy was                            accomplished without difficulty. The patient                            tolerated the procedure well. Scope In: Scope Out: Findings:                 The examined esophagus was normal. Specifically, no                            mucosal abnormalities, ring or stricture were seen.                            The scope passed easily through the EGJ. Several                            biopsies were obtained in the mid esophagus and in                            the distal esophagus with cold forceps for                            evaluation of eosinophilic esophagitis.                           The stomach was normal.                           The cardia and gastric fundus were normal on                            retroflexion.                           The examined duodenum was normal. Complications:            No immediate complications. Estimated Blood Loss:     Estimated blood loss: none. Impression:               -  Normal esophagus.                           - Normal stomach.                           - Normal examined duodenum.                           - Several biopsies were obtained in the mid                            esophagus and in the distal esophagus. If normal,                            this appears to be a GERD-related dysmotility. Recommendation:           - Patient has a contact number available for                            emergencies. The signs and symptoms of potential                            delayed complications were discussed with the                            patient. Return to normal activities tomorrow.                            Written discharge instructions were provided to the                            patient.                           - Resume previous diet.                           - Continue present medications.                           - Await pathology results.                           - Add omeprazole 20 mg (over  the counter) every day                            at supper meal for 4 weeks.                           Anti-GERD diet/lifestyle measures. Ervan Heber L. Loletha Carrow, MD 08/08/2016 12:13:16 PM This report has been signed electronically.

## 2016-08-08 NOTE — Telephone Encounter (Signed)
Because of her elevated sugars she is showing signs she is going to need to be on insulin. I can see the patient this Thursday to discuss her sugars and to recommend starting insulin she needs to bring all of her medications with her and her readings.

## 2016-08-08 NOTE — Patient Instructions (Signed)
YOU HAD AN ENDOSCOPIC PROCEDURE TODAY AT [facility]:   Refer to the procedure report that was given to you for any specific questions about what was found during the examination.  If the procedure report does not answer your questions, please call your gastroenterologist to clarify.  If you requested that your care partner not be given the details of your procedure findings, then the procedure report has been included in a sealed envelope for you to review at your convenience later.  YOU SHOULD EXPECT: Some feelings of bloating in the abdomen. Passage of more gas than usual.  Walking can help get rid of the air that was put into your GI tract during the procedure and reduce the bloating. If you had a lower endoscopy (such as a colonoscopy or flexible sigmoidoscopy) you may notice spotting of blood in your stool or on the toilet paper. If you underwent a bowel prep for your procedure, you may not have a normal bowel movement for a few days.  Please Note:  You might notice some irritation and congestion in your nose or some drainage.  This is from the oxygen used during your procedure.  There is no need for concern and it should clear up in a day or so.  SYMPTOMS TO REPORT IMMEDIATELY:   Following upper endoscopy (EGD)  Vomiting of blood or coffee ground material  New chest pain or pain under the shoulder blades  Painful or persistently difficult swallowing  New shortness of breath  Fever of 100F or higher  Black, tarry-looking stools  For urgent or emergent issues, a gastroenterologist can be reached at any hour by calling [phone].   DIET:  We do recommend a small meal at first, but then you may proceed to your regular diet.  Drink plenty of fluids but you should avoid alcoholic beverages for 24 hours.  ACTIVITY:  You should plan to take it easy for the rest of today and you should NOT DRIVE or use heavy machinery until tomorrow (because of the sedation medicines used  during the test).     Please read all handouts given to you by your recovery nurse.  Omeprazole 20mg  OTC every day at supper for 4 weeks.  FOLLOW UP: Our staff will call the number listed on your records the next business day following your procedure to check on you and address any questions or concerns that you may have regarding the information given to you following your procedure. If we do not reach you, we will leave a message.  However, if you are feeling well and you are not experiencing any problems, there is no need to return our call.  We will assume that you have returned to your regular daily activities without incident.  If any biopsies were taken you will be contacted by phone or by letter within the next 1-3 weeks.  Please call us at [phone] if you have not heard about the biopsies in 3 weeks.    SIGNATURES/CONFIDENTIALITY: You and/or your care partner have signed paperwork which will be entered into your electronic medical record.  These signatures attest to the fact that that the information above on your After Visit Summary has been reviewed and is understood.  Full responsibility of the confidentiality of this discharge information lies with you and/or your care-partner.  Thank you for letting us take care of your healthcare needs today.

## 2016-08-08 NOTE — Progress Notes (Signed)
Report to PACU, RN, vss, BBS= Clear.  

## 2016-08-09 ENCOUNTER — Telehealth: Payer: Self-pay

## 2016-08-09 NOTE — Telephone Encounter (Signed)
  Follow up Call-  Call back number 08/08/2016  Post procedure Call Back phone  # [phone]  Permission to leave phone message Yes  Some recent data might be hidden     Patient questions:  Do you have a fever, pain , or abdominal swelling? No. Pain Score  0 *  Have you tolerated food without any problems? Yes.    Have you been able to return to your normal activities? Yes.    Do you have any questions about your discharge instructions: Diet   No. Medications  No. Follow up visit  No.  Do you have questions or concerns about your Care? No.  Actions: * If pain score is 4 or above: No action needed, pain <4.  Telephone number was incorrect.  Called [phone].  No problems per the pt. maw

## 2016-08-11 ENCOUNTER — Ambulatory Visit (INDEPENDENT_AMBULATORY_CARE_PROVIDER_SITE_OTHER): Payer: 59 | Admitting: Family Medicine

## 2016-08-11 ENCOUNTER — Telehealth: Payer: Self-pay | Admitting: Family Medicine

## 2016-08-11 ENCOUNTER — Other Ambulatory Visit: Payer: Self-pay | Admitting: Family Medicine

## 2016-08-11 ENCOUNTER — Encounter: Payer: Self-pay | Admitting: Family Medicine

## 2016-08-11 VITALS — BP 130/82 | Ht 62.0 in | Wt 203.0 lb

## 2016-08-11 DIAGNOSIS — E119 Type 2 diabetes mellitus without complications: Secondary | ICD-10-CM

## 2016-08-11 LAB — POCT GLYCOSYLATED HEMOGLOBIN (HGB A1C): Hemoglobin A1C: 7

## 2016-08-11 MED ORDER — INSULIN GLARGINE 100 UNIT/ML SOLOSTAR PEN: PEN_INJECTOR | SUBCUTANEOUS | 0 refills | Status: DC

## 2016-08-11 MED ORDER — LISINOPRIL 10 MG PO TABS: 10.0000 mg | ORAL_TABLET | Freq: Every day | ORAL | 2 refills | Status: DC

## 2016-08-11 MED ORDER — CITALOPRAM HYDROBROMIDE 20 MG PO TABS: ORAL_TABLET | ORAL | 2 refills | Status: DC

## 2016-08-11 NOTE — Telephone Encounter (Signed)
Pt needs to know if she is to continue her oral diabetes meds in addition to her insulin or in place of   Please advise

## 2016-08-11 NOTE — Telephone Encounter (Signed)
Pt in office for visit. MD to send.

## 2016-08-11 NOTE — Telephone Encounter (Signed)
Stop glipizide,continue onglyza

## 2016-08-11 NOTE — Patient Instructions (Signed)
Diabetes Mellitus and Food It is important for you to manage your blood sugar (glucose) level. Your blood glucose level can be greatly affected by what you eat. Eating healthier foods in the appropriate amounts throughout the day at about the same time each day will help you control your blood glucose level. It can also help slow or prevent worsening of your diabetes mellitus. Healthy eating may even help you improve the level of your blood pressure and reach or maintain a healthy weight. General recommendations for healthful eating and cooking habits include:  Eating meals and snacks regularly. Avoid going long periods of time without eating to lose weight.  Eating a diet that consists mainly of plant-based foods, such as fruits, vegetables, nuts, legumes, and whole grains.  Using low-heat cooking methods, such as baking, instead of high-heat cooking methods, such as deep frying.  Work with your dietitian to make sure you understand how to use the Nutrition Facts information on food labels. How can food affect me? Carbohydrates Carbohydrates affect your blood glucose level more than any other type of food. Your dietitian will help you determine how many carbohydrates to eat at each meal and teach you how to count carbohydrates. Counting carbohydrates is important to keep your blood glucose at a healthy level, especially if you are using insulin or taking certain medicines for diabetes mellitus. Alcohol Alcohol can cause sudden decreases in blood glucose (hypoglycemia), especially if you use insulin or take certain medicines for diabetes mellitus. Hypoglycemia can be a life-threatening condition. Symptoms of hypoglycemia (sleepiness, dizziness, and disorientation) are similar to symptoms of having too much alcohol. If your health care provider has given you approval to drink alcohol, do so in moderation and use the following guidelines:  Women should not have more than one drink per day, and men  should not have more than two drinks per day. One drink is equal to: ? 12 oz of beer. ? 5 oz of wine. ? 1 oz of hard liquor.  Do not drink on an empty stomach.  Keep yourself hydrated. Have water, diet soda, or unsweetened iced tea.  Regular soda, juice, and other mixers might contain a lot of carbohydrates and should be counted.  What foods are not recommended? As you make food choices, it is important to remember that all foods are not the same. Some foods have fewer nutrients per serving than other foods, even though they might have the same number of calories or carbohydrates. It is difficult to get your body what it needs when you eat foods with fewer nutrients. Examples of foods that you should avoid that are high in calories and carbohydrates but low in nutrients include:  Trans fats (most processed foods list trans fats on the Nutrition Facts label).  Regular soda.  Juice.  Candy.  Sweets, such as cake, pie, doughnuts, and cookies.  Fried foods.  What foods can I eat? Eat nutrient-rich foods, which will nourish your body and keep you healthy. The food you should eat also will depend on several factors, including:  The calories you need.  The medicines you take.  Your weight.  Your blood glucose level.  Your blood pressure level.  Your cholesterol level.  You should eat a variety of foods, including:  Protein. ? Lean cuts of meat. ? Proteins low in saturated fats, such as fish, egg whites, and beans. Avoid processed meats.  Fruits and vegetables. ? Fruits and vegetables that may help control blood glucose levels, such as apples,   mangoes, and yams.  Dairy products. ? Choose fat-free or low-fat dairy products, such as milk, yogurt, and cheese.  Grains, bread, pasta, and rice. ? Choose whole grain products, such as multigrain bread, whole oats, and brown rice. These foods may help control blood pressure.  Fats. ? Foods containing healthful fats, such as  nuts, avocado, olive oil, canola oil, and fish.  Does everyone with diabetes mellitus have the same meal plan? Because every person with diabetes mellitus is different, there is not one meal plan that works for everyone. It is very important that you meet with a dietitian who will help you create a meal plan that is just right for you. This information is not intended to replace advice given to you by your health care provider. Make sure you discuss any questions you have with your health care provider. Document Released: 05/12/2005 Document Revised: 01/21/2016 Document Reviewed: 07/12/2013 Elsevier Interactive Patient Education  2017 [employer].  

## 2016-08-11 NOTE — Telephone Encounter (Signed)
Notified patient stop glipizide,continue onglyza. Patient verbalized understanding.

## 2016-08-11 NOTE — Progress Notes (Signed)
   Subjective:    Patient ID: Emily Braun, female    DOB: 1975-12-13, 40 y.o.   MRN: [id number]  Diabetes  She presents for her follow-up diabetic visit. She has type 2 diabetes mellitus. There are no hypoglycemic associated symptoms. Pertinent negatives for hypoglycemia include no confusion. There are no diabetic associated symptoms. Pertinent negatives for diabetes include no chest pain, no fatigue, no polydipsia, no polyphagia and no weakness. There are no hypoglycemic complications. There are no diabetic complications. There are no known risk factors for coronary artery disease. Current diabetic treatment includes oral agent (monotherapy). She is compliant with treatment all of the time.   Patient has had diabetic eye exam this year.  Patient is concerned about her blood sugars running high lately.  Results for orders placed or performed in visit on 08/11/16  POCT glycosylated hemoglobin (Hb A1C)  Result Value Ref Range   Hemoglobin A1C 7.0       Review of Systems  Constitutional: Negative for activity change, appetite change and fatigue.  HENT: Negative for congestion.   Respiratory: Negative for cough.   Cardiovascular: Negative for chest pain.  Gastrointestinal: Negative for abdominal pain.  Endocrine: Negative for polydipsia and polyphagia.  Neurological: Negative for weakness.  Psychiatric/Behavioral: Negative for confusion.       Objective:   Physical Exam  Constitutional: She appears well-nourished. No distress.  Cardiovascular: Normal rate, regular rhythm and normal heart sounds.   No murmur heard. Pulmonary/Chest: Effort normal and breath sounds normal. No respiratory distress.  Musculoskeletal: She exhibits no edema.  Lymphadenopathy:    She has no cervical adenopathy.  Neurological: She is alert. She exhibits normal muscle tone.  Psychiatric: Her behavior is normal.  Vitals reviewed.         Assessment & Plan:  Diabetes subpar control Start evening  insulin started 6 units gradually titrate up goal is to get morning sugars between 100-20 patient will send [place] readings every 7-14 days The patient will need to stop her glipizide. Continue Onglyza Treatment measures discuss goals discussed.  Follow-up 3 months

## 2016-08-12 ENCOUNTER — Encounter: Payer: Self-pay | Admitting: Gastroenterology

## 2016-08-18 ENCOUNTER — Other Ambulatory Visit: Payer: Self-pay | Admitting: Family Medicine

## 2016-08-19 ENCOUNTER — Ambulatory Visit (INDEPENDENT_AMBULATORY_CARE_PROVIDER_SITE_OTHER): Payer: 59 | Admitting: Nurse Practitioner

## 2016-08-19 VITALS — BP 136/92 | HR 94 | Temp 98.7°F | Wt 206.2 lb

## 2016-08-19 DIAGNOSIS — B9689 Other specified bacterial agents as the cause of diseases classified elsewhere: Secondary | ICD-10-CM | POA: Diagnosis not present

## 2016-08-19 DIAGNOSIS — J069 Acute upper respiratory infection, unspecified: Secondary | ICD-10-CM | POA: Diagnosis not present

## 2016-08-19 MED ORDER — HYDROCODONE-HOMATROPINE 5-1.5 MG/5ML PO SYRP: 5.0000 mL | ORAL_SOLUTION | ORAL | 0 refills | Status: DC | PRN

## 2016-08-19 MED ORDER — PREDNISONE 20 MG PO TABS: ORAL_TABLET | ORAL | 0 refills | Status: DC

## 2016-08-19 MED ORDER — AMOXICILLIN-POT CLAVULANATE 875-125 MG PO TABS: 1.0000 | ORAL_TABLET | Freq: Two times a day (BID) | ORAL | 0 refills | Status: DC

## 2016-08-20 ENCOUNTER — Encounter: Payer: Self-pay | Admitting: Nurse Practitioner

## 2016-08-20 NOTE — Progress Notes (Addendum)
Subjective:  Presents for c/o hoarseness and cough x 2 d. No fever. Sore throat. No headache or ear pain. Frequent cough. CP and pressure with deep breath or cough. No wheezing. No SOB. No V/D or abd pain.   Objective:   BP (!) 136/92   Pulse 94   Temp 98.7 F (37.1 C) (Oral)   Wt 206 lb 3.2 oz (93.5 kg)   SpO2 97%   BMI 37.71 kg/m  NAD. Alert, oriented. TMs clear effusion. Pharynx injected with PND noted. Neck supple with mild anterior adenopathy. Lungs clear. Occasional non productive cough noted. Heart RRR  Assessment: Bacterial upper respiratory infection  Plan:  Meds ordered this encounter  Medications  . amoxicillin-clavulanate (AUGMENTIN) 875-125 MG tablet    Sig: Take 1 tablet by mouth 2 (two) times daily.    Dispense:  20 tablet    Refill:  0    Order Specific Question:   Supervising Provider    Answer:   Mikey Kirschner [[id number]]  . predniSONE (DELTASONE) 20 MG tablet    Sig: 2 po qd x 5 d    Dispense:  10 tablet    Refill:  0    Order Specific Question:   Supervising Provider    Answer:   Mikey Kirschner [[id number]]  . HYDROcodone-homatropine (HYCODAN) 5-1.5 MG/5ML syrup    Sig: Take 5 mLs by mouth every 4 (four) hours as needed.    Dispense:  120 mL    Refill:  0    Order Specific Question:   Supervising Provider    Answer:   Mikey Kirschner [[id number]]   Reviewed symptomatic care and warning signs. Given Rx for Prednisone to have on hand over the weekend if worse. Call back next week if no improvement. Patient understands that steroids will increase glucose; use only if necessary.

## 2016-09-13 ENCOUNTER — Telehealth: Payer: Self-pay | Admitting: Family Medicine

## 2016-09-13 NOTE — Telephone Encounter (Signed)
Review blood sugar log results faxed in from [facility] in results folder.

## 2016-09-16 ENCOUNTER — Encounter: Payer: 59 | Admitting: Nurse Practitioner

## 2016-09-16 ENCOUNTER — Ambulatory Visit: Payer: 59 | Admitting: Family Medicine

## 2016-09-20 ENCOUNTER — Encounter: Payer: Self-pay | Admitting: Family Medicine

## 2016-09-20 NOTE — Telephone Encounter (Signed)
I sent the patient a message my chart

## 2016-09-23 ENCOUNTER — Ambulatory Visit: Payer: 59 | Admitting: Family Medicine

## 2016-09-27 ENCOUNTER — Ambulatory Visit: Payer: 59 | Admitting: Family Medicine

## 2016-09-29 DIAGNOSIS — J029 Acute pharyngitis, unspecified: Secondary | ICD-10-CM | POA: Diagnosis not present

## 2016-09-30 ENCOUNTER — Encounter: Payer: Self-pay | Admitting: Nurse Practitioner

## 2016-09-30 ENCOUNTER — Ambulatory Visit (INDEPENDENT_AMBULATORY_CARE_PROVIDER_SITE_OTHER): Payer: 59 | Admitting: Nurse Practitioner

## 2016-09-30 VITALS — BP 130/82 | Ht 62.0 in | Wt 204.0 lb

## 2016-09-30 DIAGNOSIS — J029 Acute pharyngitis, unspecified: Secondary | ICD-10-CM | POA: Diagnosis not present

## 2016-09-30 DIAGNOSIS — Z0000 Encounter for general adult medical examination without abnormal findings: Secondary | ICD-10-CM

## 2016-09-30 DIAGNOSIS — E119 Type 2 diabetes mellitus without complications: Secondary | ICD-10-CM

## 2016-09-30 DIAGNOSIS — Z794 Long term (current) use of insulin: Secondary | ICD-10-CM

## 2016-09-30 LAB — POCT RAPID STREP A (OFFICE): Rapid Strep A Screen: NEGATIVE

## 2016-09-30 NOTE — Progress Notes (Signed)
Subjective:    Patient ID: Emily Braun, female    DOB: 1975-09-17, 41 y.o.   MRN: [id number]  HPI 41 yo female, seen today for wellness exam and f/u for diabetes.  Last eye exam 7/17 with no new changes.  Reports L.eye "crusty" in the mornings when she wakes up, denies any other drainage, swelling, itching, or redness.  Vision gets progressively blurry through the day, removes contact and resolves with glasses.  Tried a new pair of contacts with same results.  Gets routine dental care.  Colonoscopy complete 2014 d/t bloody stool, negative for abnormalities.  Denies any current bloody stool. Continues intermittent const/diarrhea, but no new changes for her.  Last Pap 07/2014, LMP 09/17/16. No new sexual partners. Menstrual cycle regular with normal flow.     Concerned about recent blood sugars.  AM fasting glucose ranging 170's-230's, PM 200-400. Increased to 38 units last evening as per orders.  Fasting glucose this am was 148.  Pt. Walks occasionally for exercise and tries to be conscious of her eating habits. Rec'd education from diabetic educator last year.     Review of Systems  Constitutional: Negative for activity change, appetite change, chills, fatigue, fever and unexpected weight change.  HENT: Positive for sore throat. Negative for dental problem, ear pain, sinus pressure and trouble swallowing.   Respiratory: Negative for cough, chest tightness, shortness of breath and wheezing.   Cardiovascular: Negative for chest pain, palpitations and leg swelling.  Gastrointestinal: Negative for abdominal distention, abdominal pain, nausea and vomiting.  Genitourinary: Negative for difficulty urinating, dysuria, enuresis, frequency, genital sores, menstrual problem, pelvic pain, urgency and vaginal discharge.  Neurological: Negative for dizziness, weakness and numbness.       Objective:   Physical Exam  Constitutional: She is oriented to person, place, and time. She appears well-developed  and well-nourished. No distress.  HENT:  Right Ear: Tympanic membrane and external ear normal.  Left Ear: Tympanic membrane and external ear normal.  Mouth/Throat: Posterior oropharyngeal erythema present.  Eyes: Conjunctivae and lids are normal. Right eye exhibits no discharge and no exudate. Left eye exhibits no discharge and no exudate.  Neck: Normal range of motion. Neck supple. No tracheal deviation present. No thyromegaly present.  Cardiovascular: Normal rate, regular rhythm and normal heart sounds.  Exam reveals no gallop.   No murmur heard. Pulses:      Radial pulses are 2+ on the right side, and 2+ on the left side.  Pulmonary/Chest: Effort normal and breath sounds normal.  Abdominal: Soft. She exhibits no distension. There is no tenderness.  Genitourinary: Uterus normal. No breast swelling, tenderness or discharge. There is no rash or lesion on the right labia. There is no rash or lesion on the left labia. Cervix exhibits no motion tenderness and no discharge. No erythema or tenderness in the vagina. No vaginal discharge found.  Genitourinary Comments: Bimanual: no CMT, no obvious masses palpable  Musculoskeletal: Normal range of motion. She exhibits no edema.  Lymphadenopathy:    She has no cervical adenopathy.  Neurological: She is alert and oriented to person, place, and time.  Skin: Skin is warm and dry. No rash noted.  Psychiatric: She has a normal mood and affect. Her behavior is normal.  Breast: symmetrical, no obvious masses palpable, no axillary adenopathy        Assessment & Plan:   Problem List Items Addressed This Visit    Type 2 diabetes mellitus without complication, without long-term current use of insulin ([place])  Other Visit Diagnoses    Wellness examination    -  Primary   Acute pharyngitis, unspecified etiology       Relevant Orders   Strep A DNA probe   POCT rapid strep A (Completed)     Results for orders placed or performed in visit on 09/30/16    POCT rapid strep A  Result Value Ref Range   Rapid Strep A Screen Negative Negative    Diet and exercise education provided Discussed Gastric sleeve surgery and birth control options  Trial Zatidor OTC for eye allergy symptoms, follow up with eye specialist if symptoms do not improve or worsens Symptom care and warning signs reviewed   Type 2 Diabetes:  Increase Lantus 2 units every 3-4 days until blood sugar returns to baseline, 50 units Max  Notify office if any questions or concerns   Return for follow up with Dr. Nicki Reaper in March as planned.

## 2016-09-30 NOTE — Patient Instructions (Addendum)
Over the counter Zaditor, can get generic for allergy, dry eye  Increase Lantus by 2 units every 3-4 days until blood sugars return to baseline, Maximum 50 units Message [place] through MyChart if any questions or concerns

## 2016-10-01 LAB — STREP A DNA PROBE: Strep Gp A Direct, DNA Probe: NEGATIVE

## 2016-10-07 ENCOUNTER — Other Ambulatory Visit: Payer: Self-pay | Admitting: Family Medicine

## 2016-10-10 ENCOUNTER — Other Ambulatory Visit: Payer: Self-pay | Admitting: Family Medicine

## 2016-10-12 ENCOUNTER — Other Ambulatory Visit: Payer: Self-pay | Admitting: Family Medicine

## 2016-11-16 ENCOUNTER — Other Ambulatory Visit: Payer: Self-pay | Admitting: Family Medicine

## 2016-11-18 ENCOUNTER — Ambulatory Visit (INDEPENDENT_AMBULATORY_CARE_PROVIDER_SITE_OTHER): Payer: 59 | Admitting: Family Medicine

## 2016-11-18 ENCOUNTER — Encounter: Payer: Self-pay | Admitting: Family Medicine

## 2016-11-18 ENCOUNTER — Telehealth: Payer: Self-pay | Admitting: Family Medicine

## 2016-11-18 VITALS — BP 132/82 | Ht 62.0 in | Wt 208.6 lb

## 2016-11-18 DIAGNOSIS — Z79899 Other long term (current) drug therapy: Secondary | ICD-10-CM

## 2016-11-18 DIAGNOSIS — E119 Type 2 diabetes mellitus without complications: Secondary | ICD-10-CM | POA: Diagnosis not present

## 2016-11-18 DIAGNOSIS — E785 Hyperlipidemia, unspecified: Secondary | ICD-10-CM | POA: Diagnosis not present

## 2016-11-18 LAB — POCT GLYCOSYLATED HEMOGLOBIN (HGB A1C): Hemoglobin A1C: 8.3

## 2016-11-18 MED ORDER — FLUCONAZOLE 150 MG PO TABS: 150.0000 mg | ORAL_TABLET | Freq: Once | ORAL | 5 refills | Status: AC

## 2016-11-18 MED ORDER — INSULIN GLARGINE 100 UNIT/ML SOLOSTAR PEN: PEN_INJECTOR | SUBCUTANEOUS | 5 refills | Status: DC

## 2016-11-18 MED ORDER — DULAGLUTIDE 0.75 MG/0.5ML ~~LOC~~ SOAJ: 0.7500 mg | SUBCUTANEOUS | 5 refills | Status: DC

## 2016-11-18 NOTE — Progress Notes (Signed)
   Subjective:    Patient ID: Emily Braun, female    DOB: 22-Jan-1976, 41 y.o.   MRN: [id number]  Diabetes  She presents for her follow-up diabetic visit. She has type 2 diabetes mellitus. Pertinent negatives for hypoglycemia include no confusion. Pertinent negatives for diabetes include no chest pain, no fatigue, no polydipsia, no polyphagia and no weakness. Risk factors for coronary artery disease include diabetes mellitus. Current diabetic treatment includes insulin injections. She is compliant with treatment all of the time. Her weight is stable. She is following a diabetic diet. She has not had a previous visit with a dietitian. She does not see a podiatrist.Eye exam is not current.  I reviewed over her diabetes is not under good control we talked at length about the various approaches that could be done including possibility possibility of adding daytime insulin as well as a G LP 1 inhibitor injectable she had localized itching at the site of shot a previous one as well as a lump where the injection was but I do not consider that a true allergy    Review of Systems  Constitutional: Negative for activity change, appetite change and fatigue.  HENT: Negative for congestion.   Respiratory: Negative for cough.   Cardiovascular: Negative for chest pain.  Gastrointestinal: Negative for abdominal pain.  Endocrine: Negative for polydipsia and polyphagia.  Neurological: Negative for weakness.  Psychiatric/Behavioral: Negative for confusion.       Objective:   Physical Exam  Constitutional: She appears well-nourished. No distress.  Cardiovascular: Normal rate, regular rhythm and normal heart sounds.   No murmur heard. Pulmonary/Chest: Effort normal and breath sounds normal. No respiratory distress.  Musculoskeletal: She exhibits no edema.  Lymphadenopathy:    She has no cervical adenopathy.  Neurological: She is alert. She exhibits normal muscle tone.  Psychiatric: Her behavior is  normal.  Vitals reviewed.  Diabetic foot exam normal       Assessment & Plan:  Diabetes subpar control it is very important to get this under better control we talked about how to titrate the long-acting insulin to get the blood sugar down to 90-120 range. #2 we also talked about starting a G LP inhibitor injectable therefore we will use Trulicity start off at the 0.75 mg once a week if she tolerates this well will bump this up she is instructed to send [place] readings over the course of next 2 weeks she is instructed to let us know if she is unable to afford this medication we will stop Onglyza  Patient is doing well otherwise. She will do comprehensive lab work before the next visit greater and 25 minutes spent with this patient discussing the diabetes and the approach toward treatment. Greater than half in counseling

## 2016-11-18 NOTE — Telephone Encounter (Signed)
I recommend using Levemir same directions thank you

## 2016-11-18 NOTE — Telephone Encounter (Signed)
Received Prior Authorization request for patient's Lantus. Lantus is not covered [employer] and Levemir is preferred. Please advise?

## 2016-11-18 NOTE — Telephone Encounter (Signed)
PT called back and said the pharmacy entered the Rx for the Lantus in wrong. They resubmitted and it was approved. No change necessary.

## 2016-11-18 NOTE — Telephone Encounter (Signed)
Left message to return call 

## 2016-12-16 ENCOUNTER — Other Ambulatory Visit: Payer: Self-pay | Admitting: Family Medicine

## 2016-12-18 ENCOUNTER — Other Ambulatory Visit: Payer: Self-pay | Admitting: Family Medicine

## 2016-12-21 ENCOUNTER — Telehealth: Payer: Self-pay | Admitting: Family Medicine

## 2016-12-21 NOTE — Telephone Encounter (Signed)
The patient recently brought by her glucose readings. Please tell the patient I did review these. I recommend the following #1 I would increase her insulin at nighttime to 62 units ideally I would like to see the majority of her blood sugar readings between 95 and 125. #2 I recommend increasing Trulicity to 1. 5 subcutaneous weekly. Her insulin may need to be represcribed with new directions that allow her to titrate up to 80 units. The patient does need follow-up as planned she can feel free to send me some readings in several weeks' time thank you-as the increased dose of Trulicity takes effect it is possible that she may need to reduce her insulin

## 2016-12-22 ENCOUNTER — Other Ambulatory Visit: Payer: Self-pay | Admitting: *Deleted

## 2016-12-22 MED ORDER — INSULIN GLARGINE 100 UNIT/ML SOLOSTAR PEN: PEN_INJECTOR | SUBCUTANEOUS | 2 refills | Status: DC

## 2016-12-22 MED ORDER — DULAGLUTIDE 1.5 MG/0.5ML ~~LOC~~ SOAJ: SUBCUTANEOUS | 2 refills | Status: DC

## 2016-12-22 NOTE — Telephone Encounter (Signed)
Discussed with pt. Pt verbalized understanding. New doses of meds sent to pharm.

## 2016-12-23 ENCOUNTER — Telehealth: Payer: Self-pay | Admitting: Family Medicine

## 2016-12-23 NOTE — Telephone Encounter (Signed)
Patient dropped off a medical evaluation form to be completed.

## 2016-12-26 NOTE — Telephone Encounter (Signed)
A this form was filled out please forward

## 2017-02-03 ENCOUNTER — Telehealth: Payer: Self-pay | Admitting: *Deleted

## 2017-02-03 ENCOUNTER — Encounter: Payer: Self-pay | Admitting: Nurse Practitioner

## 2017-02-03 ENCOUNTER — Ambulatory Visit (INDEPENDENT_AMBULATORY_CARE_PROVIDER_SITE_OTHER): Payer: 59 | Admitting: Nurse Practitioner

## 2017-02-03 VITALS — BP 118/74 | Temp 98.9°F | Ht 62.0 in | Wt 208.0 lb

## 2017-02-03 DIAGNOSIS — R300 Dysuria: Secondary | ICD-10-CM

## 2017-02-03 DIAGNOSIS — B3731 Acute candidiasis of vulva and vagina: Secondary | ICD-10-CM

## 2017-02-03 DIAGNOSIS — B373 Candidiasis of vulva and vagina: Secondary | ICD-10-CM | POA: Diagnosis not present

## 2017-02-03 LAB — POCT URINALYSIS DIPSTICK
SPEC GRAV UA: 1.02 (ref 1.010–1.025)
pH, UA: 5 (ref 5.0–8.0)

## 2017-02-03 MED ORDER — TERCONAZOLE 0.4 % VA CREA: 1.0000 | TOPICAL_CREAM | Freq: Every day | VAGINAL | 0 refills | Status: DC

## 2017-02-03 NOTE — Telephone Encounter (Signed)
Pt dropped off form that was previously filled out. She would like you to add HTN and acid reflux to the medical conditions. She states they needed it added since she was taking meds for the issues. Call when ready for pickup. Formin dr scott's folder.

## 2017-02-04 ENCOUNTER — Encounter: Payer: Self-pay | Admitting: Nurse Practitioner

## 2017-02-04 LAB — POCT WET PREP WITH KOH
CLUE CELLS WET PREP PER HPF POC: NEGATIVE
KOH PREP POC: NEGATIVE
PH WET PREP: 4.5
RBC Wet Prep HPF POC: NEGATIVE
TRICHOMONAS UA: NEGATIVE
WBC Wet Prep HPF POC: NEGATIVE

## 2017-02-04 NOTE — Progress Notes (Signed)
Subjective:  Presents for c/o external burning with urination. Some itching. No vaginal discharge or pelvic pain. No fever. No new sexual partners. Took 2 doses of Diflucan over the past 2 weeks, would improve for a few days then come back. Sugars are doing well. FBS today 91. nonfasting 142. Has chronic urinary urgency and frequency. Slight daily leakage requiring a light pad. Began about a year ago. No change in symptoms.   Objective:   BP 118/74   Temp 98.9 F (37.2 C) (Oral)   Ht 5\' 2"  (1.575 m)   Wt 208 lb (94.3 kg)   BMI 38.04 kg/m  NAD. Alert, oriented. Lungs clear. Heart RRR. No CVA tenderness. Abdomen soft, non distended, non tender. External GU: mild irritation. Cotton swab sample obtained from vagina.  Results for orders placed or performed in visit on 02/03/17  POCT urinalysis dipstick  Result Value Ref Range   Color, UA     Clarity, UA     Glucose, UA     Bilirubin, UA     Ketones, UA     Spec Grav, UA 1.020 1.010 - 1.025   Blood, UA     pH, UA 5.0 5.0 - 8.0   Protein, UA     Urobilinogen, UA  0.2 or 1.0 E.U./dL   Nitrite, UA     Leukocytes, UA  Negative  POCT Wet Prep with KOH  Result Value Ref Range   Trichomonas, UA Negative    Clue Cells Wet Prep HPF POC neg    Epithelial Wet Prep HPF POC Few Few, Moderate, Many, Too numerous to count   Yeast Wet Prep HPF POC rare    Bacteria Wet Prep HPF POC None (A) Few   RBC Wet Prep HPF POC neg    WBC Wet Prep HPF POC neg    KOH Prep POC Negative Negative   pH, Wet Prep 4.5       Assessment:  Dysuria - Plan: POCT urinalysis dipstick  Vaginal candidiasis - Plan: POCT Wet Prep with KOH    Plan:   Meds ordered this encounter  Medications  . terconazole (TERAZOL 7) 0.4 % vaginal cream    Sig: Place 1 applicator vaginally at bedtime. X 7 d    Dispense:  45 g    Refill:  0    Order Specific Question:   Supervising Provider    Answer:   Mikey Kirschner [[id number]]   Call back if worsens or persists.

## 2017-02-05 NOTE — Telephone Encounter (Signed)
This was added

## 2017-02-06 ENCOUNTER — Telehealth: Payer: Self-pay | Admitting: Family Medicine

## 2017-02-06 NOTE — Telephone Encounter (Signed)
#  1 please inform the patient that due to her insurance they are recommending a switch-Levemir should work well for the patient, #2 I would recommend Levemir, #3 continue the current dosing she is using but report to [place] if number start fluctuating, #4 keep follow-up visit at the end of the month

## 2017-02-06 NOTE — Telephone Encounter (Signed)
Pt states she has enough to last til her next appt and she is getting ready to change insurance. She does not want new med called in at this time. She will call us back if she cannot get with her new insurance.

## 2017-02-06 NOTE — Telephone Encounter (Signed)
Fax received from [employer] prefers Engineer, agricultural or Levemir  Or get prior auth for [employer]  Please advise

## 2017-02-17 ENCOUNTER — Ambulatory Visit: Payer: 59 | Admitting: Family Medicine

## 2017-02-19 ENCOUNTER — Other Ambulatory Visit: Payer: Self-pay | Admitting: Family Medicine

## 2017-02-24 ENCOUNTER — Ambulatory Visit (INDEPENDENT_AMBULATORY_CARE_PROVIDER_SITE_OTHER): Payer: 59 | Admitting: Family Medicine

## 2017-02-24 ENCOUNTER — Encounter: Payer: Self-pay | Admitting: Family Medicine

## 2017-02-24 VITALS — BP 128/88 | Ht 62.0 in | Wt 204.0 lb

## 2017-02-24 DIAGNOSIS — E119 Type 2 diabetes mellitus without complications: Secondary | ICD-10-CM | POA: Diagnosis not present

## 2017-02-24 DIAGNOSIS — I10 Essential (primary) hypertension: Secondary | ICD-10-CM

## 2017-02-24 DIAGNOSIS — Z79899 Other long term (current) drug therapy: Secondary | ICD-10-CM

## 2017-02-24 DIAGNOSIS — Z13220 Encounter for screening for lipoid disorders: Secondary | ICD-10-CM | POA: Diagnosis not present

## 2017-02-24 LAB — POCT GLYCOSYLATED HEMOGLOBIN (HGB A1C): Hemoglobin A1C: 5.9

## 2017-02-24 MED ORDER — INSULIN DETEMIR 100 UNIT/ML FLEXPEN: PEN_INJECTOR | SUBCUTANEOUS | 5 refills | Status: DC

## 2017-02-24 MED ORDER — LISINOPRIL 10 MG PO TABS: 10.0000 mg | ORAL_TABLET | Freq: Every day | ORAL | 1 refills | Status: DC

## 2017-02-24 MED ORDER — DULAGLUTIDE 1.5 MG/0.5ML ~~LOC~~ SOAJ: SUBCUTANEOUS | 8 refills | Status: DC

## 2017-02-24 NOTE — Progress Notes (Signed)
   Subjective:    Patient ID: Emily Braun, female    DOB: 19-Aug-1976, 41 y.o.   MRN: [id number]  A1C 5.9   Diabetes  She presents for her follow-up diabetic visit. She has type 2 diabetes mellitus. She is following a generally healthy diet. She participates in exercise intermittently. Eye exam is current.   Eyes are crusty in the mornings since January. Tried otc eye drops.    Review of Systems Denies except excessive thirst urination nausea vomiting diarrhea denies fever chills sweats    Objective:   Physical Exam Lungs clear heart regular extremities no edema diabetic foot exam normal  The eyes show no sign of infection Patient encouraged to eat healthy stay active try to lose weight    Assessment & Plan:  Crusting around the eyes I recommend trying warm soapy water once or twice daily see if this doesn't get things doing better  If ongoing eye issues follow-up with her eye doctor  Diabetes very good control continue current measures switch to Levemir because of insurance issues  Check blood pressure periodically send [place] readings  Lab work in 5 months with follow-up in 6 months

## 2017-03-06 ENCOUNTER — Ambulatory Visit (INDEPENDENT_AMBULATORY_CARE_PROVIDER_SITE_OTHER): Payer: 59 | Admitting: Otolaryngology

## 2017-03-06 DIAGNOSIS — J310 Chronic rhinitis: Secondary | ICD-10-CM

## 2017-03-06 DIAGNOSIS — J338 Other polyp of sinus: Secondary | ICD-10-CM | POA: Diagnosis not present

## 2017-03-06 DIAGNOSIS — S022XXA Fracture of nasal bones, initial encounter for closed fracture: Secondary | ICD-10-CM | POA: Diagnosis not present

## 2017-05-04 ENCOUNTER — Encounter: Payer: Self-pay | Admitting: Family Medicine

## 2017-05-04 ENCOUNTER — Telehealth: Payer: Self-pay | Admitting: Family Medicine

## 2017-05-04 NOTE — Telephone Encounter (Signed)
A letter was dictated please forward to the patient-the patient to read over it if she feels it needs any changes she can let us know

## 2017-05-04 NOTE — Telephone Encounter (Signed)
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

## 2017-05-09 ENCOUNTER — Other Ambulatory Visit: Payer: Self-pay | Admitting: Family Medicine

## 2017-05-26 ENCOUNTER — Encounter: Payer: Self-pay | Admitting: Family Medicine

## 2017-05-26 DIAGNOSIS — E109 Type 1 diabetes mellitus without complications: Secondary | ICD-10-CM | POA: Diagnosis not present

## 2017-05-26 LAB — HM DIABETES EYE EXAM

## 2017-06-09 ENCOUNTER — Encounter: Payer: Self-pay | Admitting: *Deleted

## 2017-06-09 LAB — HM DIABETES EYE EXAM

## 2017-06-12 ENCOUNTER — Other Ambulatory Visit: Payer: Self-pay | Admitting: Family Medicine

## 2017-07-06 DIAGNOSIS — Z13220 Encounter for screening for lipoid disorders: Secondary | ICD-10-CM | POA: Diagnosis not present

## 2017-07-06 DIAGNOSIS — E119 Type 2 diabetes mellitus without complications: Secondary | ICD-10-CM | POA: Diagnosis not present

## 2017-07-06 DIAGNOSIS — Z79899 Other long term (current) drug therapy: Secondary | ICD-10-CM | POA: Diagnosis not present

## 2017-07-07 LAB — HEPATIC FUNCTION PANEL
ALBUMIN: 4.4 g/dL (ref 3.5–5.5)
ALT: 17 IU/L (ref 0–32)
AST: 15 IU/L (ref 0–40)
Alkaline Phosphatase: 71 IU/L (ref 39–117)
Bilirubin Total: 0.3 mg/dL (ref 0.0–1.2)
Bilirubin, Direct: 0.12 mg/dL (ref 0.00–0.40)
TOTAL PROTEIN: 7.1 g/dL (ref 6.0–8.5)

## 2017-07-07 LAB — MICROALBUMIN / CREATININE URINE RATIO
CREATININE, UR: 98.3 mg/dL
Microalb/Creat Ratio: 5.1 mg/g creat (ref 0.0–30.0)
Microalbumin, Urine: 5 ug/mL

## 2017-07-07 LAB — BASIC METABOLIC PANEL
BUN / CREAT RATIO: 17 (ref 9–23)
BUN: 12 mg/dL (ref 6–24)
CHLORIDE: 100 mmol/L (ref 96–106)
CO2: 26 mmol/L (ref 20–29)
Calcium: 9.9 mg/dL (ref 8.7–10.2)
Creatinine, Ser: 0.71 mg/dL (ref 0.57–1.00)
GFR calc Af Amer: 123 mL/min/{1.73_m2} (ref >59)
GFR calc non Af Amer: 107 mL/min/{1.73_m2} (ref >59)
GLUCOSE: 123 mg/dL — AB (ref 65–99)
POTASSIUM: 4.9 mmol/L (ref 3.5–5.2)
Sodium: 142 mmol/L (ref 134–144)

## 2017-07-07 LAB — LIPID PANEL
CHOLESTEROL TOTAL: 93 mg/dL — AB (ref 100–199)
Chol/HDL Ratio: 1.8 ratio (ref 0.0–4.4)
HDL: 51 mg/dL (ref >39)
LDL CALC: 33 mg/dL (ref 0–99)
Triglycerides: 46 mg/dL (ref 0–149)
VLDL CHOLESTEROL CAL: 9 mg/dL (ref 5–40)

## 2017-07-07 LAB — HEMOGLOBIN A1C
ESTIMATED AVERAGE GLUCOSE: 137 mg/dL
Hgb A1c MFr Bld: 6.4 % — ABNORMAL HIGH (ref 4.8–5.6)

## 2017-08-09 ENCOUNTER — Other Ambulatory Visit: Payer: Self-pay | Admitting: Nurse Practitioner

## 2017-08-25 ENCOUNTER — Ambulatory Visit: Payer: 59 | Admitting: Family Medicine

## 2017-08-25 ENCOUNTER — Encounter: Payer: Self-pay | Admitting: Family Medicine

## 2017-08-25 VITALS — BP 122/78 | Ht 62.0 in | Wt 204.6 lb

## 2017-08-25 DIAGNOSIS — Z794 Long term (current) use of insulin: Secondary | ICD-10-CM

## 2017-08-25 DIAGNOSIS — I10 Essential (primary) hypertension: Secondary | ICD-10-CM | POA: Diagnosis not present

## 2017-08-25 DIAGNOSIS — E119 Type 2 diabetes mellitus without complications: Secondary | ICD-10-CM | POA: Diagnosis not present

## 2017-08-25 DIAGNOSIS — Z23 Encounter for immunization: Secondary | ICD-10-CM

## 2017-08-25 MED ORDER — INSULIN DETEMIR 100 UNIT/ML FLEXPEN: PEN_INJECTOR | SUBCUTANEOUS | 5 refills | Status: DC

## 2017-08-25 MED ORDER — PRAVASTATIN SODIUM 20 MG PO TABS: ORAL_TABLET | ORAL | 3 refills | Status: DC

## 2017-08-25 MED ORDER — LISINOPRIL 10 MG PO TABS: 10.0000 mg | ORAL_TABLET | Freq: Every day | ORAL | 1 refills | Status: DC

## 2017-08-25 MED ORDER — CITALOPRAM HYDROBROMIDE 20 MG PO TABS: ORAL_TABLET | ORAL | 1 refills | Status: DC

## 2017-08-25 MED ORDER — PANTOPRAZOLE SODIUM 40 MG PO TBEC: DELAYED_RELEASE_TABLET | ORAL | 1 refills | Status: DC

## 2017-08-25 MED ORDER — DULAGLUTIDE 1.5 MG/0.5ML ~~LOC~~ SOAJ: SUBCUTANEOUS | 2 refills | Status: DC

## 2017-08-25 NOTE — Progress Notes (Signed)
Subjective:    Patient ID: Emily Braun, female    DOB: 08-Mar-1976, 41 y.o.   MRN: [id number]  Diabetes  She presents for her follow-up diabetic visit. She has type 2 diabetes mellitus. Pertinent negatives for hypoglycemia include no confusion. Pertinent negatives for diabetes include no chest pain, no fatigue, no polydipsia, no polyphagia and no weakness. Risk factors for coronary artery disease include diabetes mellitus, dyslipidemia and hypertension. Current diabetic treatment includes insulin injections and oral agent (dual therapy). She is following a diabetic diet.    Discuss blood work results   Review of Systems  Constitutional: Negative for activity change, appetite change and fatigue.  HENT: Negative for congestion.   Respiratory: Negative for cough.   Cardiovascular: Negative for chest pain.  Gastrointestinal: Negative for abdominal pain.  Endocrine: Negative for polydipsia and polyphagia.  Skin: Negative for color change.  Neurological: Negative for weakness.  Psychiatric/Behavioral: Negative for confusion.       Objective:   Physical Exam  Constitutional: She appears well-developed and well-nourished. No distress.  HENT:  Head: Normocephalic and atraumatic.  Eyes: Right eye exhibits no discharge. Left eye exhibits no discharge.  Neck: No tracheal deviation present.  Cardiovascular: Normal rate, regular rhythm and normal heart sounds.  No murmur heard. Pulmonary/Chest: Effort normal and breath sounds normal. No respiratory distress. She has no wheezes. She has no rales.  Musculoskeletal: She exhibits no edema.  Lymphadenopathy:    She has no cervical adenopathy.  Neurological: She is alert. She exhibits normal muscle tone.  Skin: Skin is warm and dry. No erythema.  Psychiatric: Her behavior is normal.  Vitals reviewed.         Assessment & Plan:  The patient was seen today as part of a comprehensive visit for diabetes. The importance of keeping her A1c  at or below 7 was discussed. Importance of regular physical activity was discussed. Proper monitoring of glucose levels with glucometer discussed. The importance of adherence to medication as well as a controlled low starch/sugar diet was also discussed. Also discussion regarding the importance of diabetic foot checks including self check every day. Also yearly diabetic eye exams recommended. The importance of keeping blood pressure under control and keeping LDL below 70 was also discussed. Also the importance of avoiding smoking. Standard follow-up visit recommended. Finally failure to follow good diabetic measures including self effort and compliance with recommendations can certainly increase the risk of heart disease strokes kidney failure blindness loss of limb and early death was discussed with the patient.  The patient was seen today as part of an evaluation regarding hyperlipidemia. Recent lab work has been reviewed with the patient as well as the goals for good cholesterol care. In addition to this medications have been discussed the importance of compliance with diet and medications discussed as well. Patient has been informed of potential side effects of medications in the importance to notify us should any problems occur. Finally the patient is aware that poor control of cholesterol, noncompliance can dramatically increase her risk of heart attack strokes and premature death. The patient will keep regular office visits and the patient does agreed to periodic lab work.  HTN- Patient was seen today as part of a visit regarding hypertension. The importance of healthy diet and regular physical activity was discussed. The importance of compliance with medications discussed. Ideal goal is to keep blood pressure low elevated levels certainly below 160/73 when possible. The patient was counseled that keeping blood pressure under control lessen  his risk of heart attack, stroke, kidney failure, and early  death. The importance of regular follow-ups was discussed with the patient. Low-salt diet such as DASH recommended. Regular physical activity was recommended as well. Patient was advised to keep regular follow-ups.   Overall patient is doing well she needs to work hard on diet and increasing exercise to bring her weight down follow-up in 6 months sooner if any problems

## 2017-10-06 ENCOUNTER — Encounter: Payer: 59 | Admitting: Nurse Practitioner

## 2017-10-15 ENCOUNTER — Other Ambulatory Visit: Payer: Self-pay | Admitting: Family Medicine

## 2017-10-17 ENCOUNTER — Ambulatory Visit (HOSPITAL_COMMUNITY)
Admission: RE | Admit: 2017-10-17 | Discharge: 2017-10-17 | Disposition: A | Discharge status: Home / Self Care | Payer: 59 | Source: Ambulatory Visit | Attending: Family Medicine | Admitting: Family Medicine

## 2017-10-17 ENCOUNTER — Ambulatory Visit: Payer: 59 | Admitting: Family Medicine

## 2017-10-17 ENCOUNTER — Encounter: Payer: Self-pay | Admitting: Family Medicine

## 2017-10-17 VITALS — BP 122/84 | Temp 98.1°F | Ht 62.0 in | Wt 197.0 lb

## 2017-10-17 DIAGNOSIS — G542 Cervical root disorders, not elsewhere classified: Secondary | ICD-10-CM | POA: Insufficient documentation

## 2017-10-17 DIAGNOSIS — M4802 Spinal stenosis, cervical region: Secondary | ICD-10-CM | POA: Diagnosis not present

## 2017-10-17 MED ORDER — MELOXICAM 15 MG PO TABS: 15.0000 mg | ORAL_TABLET | Freq: Every day | ORAL | 2 refills | Status: DC

## 2017-10-17 NOTE — Progress Notes (Signed)
   Subjective:    Patient ID: Emily Braun, female    DOB: Sep 03, 1975, 42 y.o.   MRN: [id number]  Neck Pain   This is a new problem. The current episode started 1 to 4 weeks ago. Episode frequency: neck pain constant; shoulder pain intermiittenly. The pain is associated with an unknown factor. The quality of the pain is described as aching. Stiffness is present in the morning. Associated symptoms include headaches. Pertinent negatives include no chest pain or weakness. Treatments tried: diflucan. The treatment provided mild relief.  diclofenac Patient relates anti-inflammatory cause her a lot of pain discomfort with her reflux.  She denies wheezing difficulty breathing nausea vomiting  She relates a lot of pain in the trapezius rhomboid area and up the back of her neck into the back of her head she describes headaches with this she also describes the pain makes it difficult for her to sleep denies weakness in the arm denies numbness All of her symptoms have been going on for 6 weeks  Review of Systems  Constitutional: Negative for activity change and appetite change.  HENT: Negative for congestion.   Respiratory: Negative for cough.   Cardiovascular: Negative for chest pain.  Gastrointestinal: Negative for abdominal pain and vomiting.  Musculoskeletal: Positive for neck pain.  Skin: Negative for color change.  Neurological: Positive for headaches. Negative for weakness.  Psychiatric/Behavioral: Negative for confusion.       Objective:   Physical Exam  Constitutional: She appears well-nourished. No distress.  HENT:  Head: Normocephalic.  Right Ear: External ear normal.  Left Ear: External ear normal.  Eyes: Right eye exhibits no discharge. Left eye exhibits no discharge.  Neck: No tracheal deviation present.  Cardiovascular: Normal rate, regular rhythm and normal heart sounds.  No murmur heard. Pulmonary/Chest: Effort normal and breath sounds normal. No respiratory distress. She  has no wheezes. She has no rales.  Musculoskeletal: She exhibits no edema.  Lymphadenopathy:    She has no cervical adenopathy.  Neurological: She is alert.  Psychiatric: Her behavior is normal.  Vitals reviewed.         Assessment & Plan:  Cervical impingement along with posterior head headaches Mobic Gentle stretching exercises X-rays indicated If this does not reveal the full nature of this the next step is possible MRI

## 2017-10-24 ENCOUNTER — Encounter: Payer: Self-pay | Admitting: Family Medicine

## 2017-11-03 ENCOUNTER — Ambulatory Visit (INDEPENDENT_AMBULATORY_CARE_PROVIDER_SITE_OTHER): Payer: 59 | Admitting: Nurse Practitioner

## 2017-11-03 ENCOUNTER — Encounter: Payer: Self-pay | Admitting: Nurse Practitioner

## 2017-11-03 VITALS — BP 132/80 | Temp 99.6°F | Ht 63.5 in | Wt 192.0 lb

## 2017-11-03 DIAGNOSIS — J069 Acute upper respiratory infection, unspecified: Secondary | ICD-10-CM | POA: Diagnosis not present

## 2017-11-03 DIAGNOSIS — Z1151 Encounter for screening for human papillomavirus (HPV): Secondary | ICD-10-CM | POA: Diagnosis not present

## 2017-11-03 DIAGNOSIS — B369 Superficial mycosis, unspecified: Secondary | ICD-10-CM | POA: Diagnosis not present

## 2017-11-03 DIAGNOSIS — Z0000 Encounter for general adult medical examination without abnormal findings: Secondary | ICD-10-CM | POA: Diagnosis not present

## 2017-11-03 DIAGNOSIS — Z0001 Encounter for general adult medical examination with abnormal findings: Secondary | ICD-10-CM

## 2017-11-03 DIAGNOSIS — Z794 Long term (current) use of insulin: Secondary | ICD-10-CM | POA: Diagnosis not present

## 2017-11-03 DIAGNOSIS — E119 Type 2 diabetes mellitus without complications: Secondary | ICD-10-CM

## 2017-11-03 DIAGNOSIS — Z124 Encounter for screening for malignant neoplasm of cervix: Secondary | ICD-10-CM | POA: Diagnosis not present

## 2017-11-03 DIAGNOSIS — N3945 Continuous leakage: Secondary | ICD-10-CM | POA: Diagnosis not present

## 2017-11-03 LAB — POCT GLYCOSYLATED HEMOGLOBIN (HGB A1C): Hemoglobin A1C: 5.4

## 2017-11-03 MED ORDER — KETOCONAZOLE 2 % EX CREA: 1.0000 "application " | TOPICAL_CREAM | Freq: Two times a day (BID) | CUTANEOUS | 0 refills | Status: DC

## 2017-11-03 NOTE — Progress Notes (Signed)
Subjective:    Patient ID: Emily Braun, female    DOB: 10/25/1975, 42 y.o.   MRN: [id number]  HPI Patient presents today for annual wellness exam. Patient denies any issues with anxiety or depression. Patient denies reflux symptoms with prescription medications. Patient reports home glucose checks ranging from 109-114. Patient has reduced levemir to 50 at bedtime. Patient denies any hypoglycemic events. Patient has lost 25lbs since beginning the keto diet. She receives regular dental care and eye exams. Patient reports urinary leakage and requests referral to urology.   Patient states she has had nasal congestion that began 3 days ago. She denies any fevers but temp 99.6 today. Patient reports non-productive cough that is worse at night. Hoarseness to voice began 2 days ago but denies sore throat or difficulty swallowing. Patient reports nasal drainage to be mostly clear but green in the morning. Patient denies sinus pressure or headaches.    Sexual Health: Patient denies any new partners. Patient take oral contractives as prescribed. LMP was last week.    Review of Systems  Constitutional: Negative for activity change, appetite change and fever.  HENT: Positive for congestion and rhinorrhea. Negative for sinus pressure, sinus pain and sore throat.   Respiratory: Positive for cough. Negative for chest tightness, shortness of breath and wheezing.   Cardiovascular: Negative for chest pain and palpitations.  Gastrointestinal: Positive for constipation. Negative for abdominal distention, abdominal pain, nausea and vomiting.  Genitourinary: Negative for difficulty urinating, dysuria, flank pain, frequency, menstrual problem, urgency, vaginal bleeding, vaginal discharge and vaginal pain.       Patient reports continuous urinary leakage.         Objective:   Physical Exam  Constitutional: She appears well-developed and well-nourished.  HENT:  Mouth/Throat: Oropharynx is clear and moist.    Ears: BIL TM slightly retracted. R TM with clear fluid noted. no erythema, exudate, or bulging Oropharynx clear with mild erythema and cloudy post nasal drainage.   Neck:  Thyroid midline with no visible enlargement, nodules, or masses on palpation.   Cardiovascular: Normal rate, regular rhythm and normal heart sounds.  Pulmonary/Chest: Effort normal and breath sounds normal. No respiratory distress. She has no wheezes.  Abdominal: Soft. She exhibits no distension. There is no tenderness.  Lymphadenopathy:  Mild cervical adenopathy. Non-tender  Breast: Symmetrical and smooth without nodules or masses. No erythema or skin changes to breasts. Nipples symmetrical without discharge or deformity. GYN: External genitalia without erythema, rash, lesions, or masses. Urethra without erythema and midline. Vaginal mucosa pink with small amount of white discharge. No odor. Cervix: nulliparous, pink, without erythema. Bimanual exam performed, no CMT. Uterus and ovaries palpated without masses or tenderness. No lesions or hemorrhoids noted on external rectal examination.  Skin: erythematous shiny rash with clear defined borders underneath breast tissue and in axillary folds.    Diabetic Foot Exam - Simple   Simple Foot Form Diabetic Foot exam was performed with the following findings:  Yes 11/03/2017 11:15 AM  Visual Inspection No deformities, no ulcerations, no other skin breakdown bilaterally:  Yes Sensation Testing Intact to touch and monofilament testing bilaterally:  Yes Pulse Check Comments DP pulses palpated bilaterally.     Vitals:   11/03/17 0954  BP: 132/80  Temp: 99.6 F (37.6 C)   Results for orders placed or performed in visit on 11/03/17  POCT glycosylated hemoglobin (Hb A1C)  Result Value Ref Range   Hemoglobin A1C 5.4        Assessment & Plan:  Problem List Items Addressed This Visit      Endocrine   DM (diabetes mellitus), type 2 ([place])   Relevant Orders   POCT  glycosylated hemoglobin (Hb A1C) (Completed)    Other Visit Diagnoses    Routine general medical examination at a health care facility    -  Primary   Relevant Orders   Pap IG and HPV (high risk) DNA detection   Screening for cervical cancer       Relevant Orders   Pap IG and HPV (high risk) DNA detection   Screening for HPV (human papillomavirus)       Relevant Orders   Pap IG and HPV (high risk) DNA detection   Continuous leakage of urine       Relevant Orders   Ambulatory referral to Urology   Fungal skin infection       Relevant Medications   ketoconazole (NIZORAL) 2 % cream   Acute upper respiratory infection       Relevant Medications   ketoconazole (NIZORAL) 2 % cream        Meds ordered this encounter  Medications  . ketoconazole (NIZORAL) 2 % cream    Sig: Apply 1 application topically 2 (two) times daily.    Dispense:  30 g    Refill:  0    Order Specific Question:   Supervising Provider    Answer:   Mikey Kirschner [[id number]]    Plan -Testing/diagnostic: Lab work was completed in nov '18 and up to date.  -Medications: Begin ketoconazole cream and apply to affected areas. Call back if persists.  -Teaching: Continue diet, exercise and weight loss efforts. Slowly decrease insulin dose if needed.  -Follow up: in 6 months. Refer to urology for urinary leakage.

## 2017-11-04 ENCOUNTER — Encounter: Payer: Self-pay | Admitting: Nurse Practitioner

## 2017-11-06 ENCOUNTER — Other Ambulatory Visit: Payer: Self-pay | Admitting: Nurse Practitioner

## 2017-11-06 ENCOUNTER — Telehealth: Payer: Self-pay | Admitting: Nurse Practitioner

## 2017-11-06 MED ORDER — AMOXICILLIN-POT CLAVULANATE 875-125 MG PO TABS: 1.0000 | ORAL_TABLET | Freq: Two times a day (BID) | ORAL | 0 refills | Status: DC

## 2017-11-06 NOTE — Telephone Encounter (Signed)
Left voicemail to return call. 

## 2017-11-06 NOTE — Telephone Encounter (Signed)
Pt called stating that Hoyle Sauer was suppose to call in an antibiotic on Friday. Pt states that she is not any better. Please advise.    [facility] [place]

## 2017-11-06 NOTE — Telephone Encounter (Signed)
Patient returned call; pt verbalized understanding

## 2017-11-06 NOTE — Telephone Encounter (Signed)
Just sent this in. Let us know if no improvement by the end of the week. Recommend taking her OTC meds as directed for allergies and sinus.

## 2017-11-08 ENCOUNTER — Encounter: Payer: Self-pay | Admitting: Family Medicine

## 2017-11-08 LAB — PAP IG AND HPV HIGH-RISK
HPV, high-risk: NEGATIVE
PAP Smear Comment: 0

## 2017-11-15 ENCOUNTER — Encounter: Payer: Self-pay | Admitting: Family Medicine

## 2017-12-18 ENCOUNTER — Telehealth: Payer: Self-pay | Admitting: Family Medicine

## 2017-12-18 DIAGNOSIS — Z1231 Encounter for screening mammogram for malignant neoplasm of breast: Secondary | ICD-10-CM

## 2017-12-18 NOTE — Telephone Encounter (Signed)
Pt called stating that the place on her breast has healed and is ready for [place] to put the order in for her to have her mammogram done. Pt states that Hoyle Sauer requested that she call once it healed. Please advise.

## 2017-12-18 NOTE — Telephone Encounter (Signed)
Please schedule screening mammogram

## 2017-12-18 NOTE — Telephone Encounter (Signed)
mammo scheduled aph Friday the 26th. At 10:30 register 10:15.

## 2017-12-18 NOTE — Telephone Encounter (Signed)
Left message to return call 

## 2017-12-18 NOTE — Telephone Encounter (Signed)
Pt notified of appt 

## 2017-12-18 NOTE — Telephone Encounter (Signed)
Please advise 

## 2017-12-22 ENCOUNTER — Ambulatory Visit (HOSPITAL_COMMUNITY)
Admission: RE | Admit: 2017-12-22 | Discharge: 2017-12-22 | Disposition: A | Discharge status: Home / Self Care | Payer: 59 | Source: Ambulatory Visit | Attending: Nurse Practitioner | Admitting: Nurse Practitioner

## 2017-12-22 ENCOUNTER — Ambulatory Visit (HOSPITAL_COMMUNITY): Payer: 59

## 2017-12-22 ENCOUNTER — Encounter (HOSPITAL_COMMUNITY): Payer: Self-pay

## 2017-12-22 DIAGNOSIS — Z1231 Encounter for screening mammogram for malignant neoplasm of breast: Secondary | ICD-10-CM | POA: Insufficient documentation

## 2018-01-09 ENCOUNTER — Encounter: Payer: Self-pay | Admitting: Family Medicine

## 2018-01-09 ENCOUNTER — Ambulatory Visit (INDEPENDENT_AMBULATORY_CARE_PROVIDER_SITE_OTHER): Payer: 59 | Admitting: Family Medicine

## 2018-01-09 VITALS — BP 128/88 | Ht 63.5 in | Wt 177.2 lb

## 2018-01-09 DIAGNOSIS — Z794 Long term (current) use of insulin: Secondary | ICD-10-CM | POA: Diagnosis not present

## 2018-01-09 DIAGNOSIS — Z13220 Encounter for screening for lipoid disorders: Secondary | ICD-10-CM | POA: Diagnosis not present

## 2018-01-09 DIAGNOSIS — I10 Essential (primary) hypertension: Secondary | ICD-10-CM | POA: Diagnosis not present

## 2018-01-09 DIAGNOSIS — Z79899 Other long term (current) drug therapy: Secondary | ICD-10-CM

## 2018-01-09 DIAGNOSIS — K219 Gastro-esophageal reflux disease without esophagitis: Secondary | ICD-10-CM | POA: Diagnosis not present

## 2018-01-09 DIAGNOSIS — E119 Type 2 diabetes mellitus without complications: Secondary | ICD-10-CM

## 2018-01-09 NOTE — Progress Notes (Signed)
   Subjective:    Patient ID: Emily Braun, female    DOB: 08-Nov-1975, 42 y.o.   MRN: [id number]  Diabetes  She presents for her follow-up diabetic visit. She has type 2 diabetes mellitus. Pertinent negatives for hypoglycemia include no confusion. Associated symptoms include weight loss. Pertinent negatives for diabetes include no chest pain, no fatigue, no polydipsia, no polyphagia and no weakness.  Pt recently had stomach bug and was unable to eat anything; has not been able to take med or weekly shot. Pt states she has lost 30 lbs since the first of the year. Insulin on twice in the past week. Has not check blood sugar at home Has history of diabetes history of obesity   Review of Systems  Constitutional: Positive for weight loss. Negative for activity change, appetite change and fatigue.  HENT: Negative for congestion.   Respiratory: Negative for cough.   Cardiovascular: Negative for chest pain.  Gastrointestinal: Negative for abdominal pain.  Endocrine: Negative for polydipsia and polyphagia.  Skin: Negative for color change.  Neurological: Negative for weakness.  Psychiatric/Behavioral: Negative for confusion.       Objective:   Physical Exam  Constitutional: She appears well-nourished. No distress.  Cardiovascular: Normal rate, regular rhythm and normal heart sounds.  No murmur heard. Pulmonary/Chest: Effort normal and breath sounds normal. No respiratory distress.  Musculoskeletal: She exhibits no edema.  Lymphadenopathy:    She has no cervical adenopathy.  Neurological: She is alert. She exhibits normal muscle tone.  Psychiatric: Her behavior is normal.  Vitals reviewed.         Assessment & Plan:  The patient is losing weight Losing weight via keto diet Sugars are gradually going down She is watching this closely We discussed strategies for her to gradually reduce her insulin as her numbers do better She will send [place] update in few weeks She will do lab work  next month with follow-up office visit

## 2018-01-11 DIAGNOSIS — D225 Melanocytic nevi of trunk: Secondary | ICD-10-CM | POA: Diagnosis not present

## 2018-01-11 DIAGNOSIS — L820 Inflamed seborrheic keratosis: Secondary | ICD-10-CM | POA: Diagnosis not present

## 2018-01-12 ENCOUNTER — Other Ambulatory Visit: Payer: Self-pay | Admitting: Family Medicine

## 2018-02-02 ENCOUNTER — Ambulatory Visit: Payer: 59 | Admitting: Urology

## 2018-02-02 DIAGNOSIS — N393 Stress incontinence (female) (male): Secondary | ICD-10-CM | POA: Diagnosis not present

## 2018-02-08 ENCOUNTER — Other Ambulatory Visit: Payer: Self-pay | Admitting: Family Medicine

## 2018-02-16 ENCOUNTER — Ambulatory Visit: Payer: 59 | Admitting: Family Medicine

## 2018-02-23 ENCOUNTER — Ambulatory Visit: Payer: 59 | Admitting: Family Medicine

## 2018-03-09 DIAGNOSIS — Z13220 Encounter for screening for lipoid disorders: Secondary | ICD-10-CM | POA: Diagnosis not present

## 2018-03-09 DIAGNOSIS — Z79899 Other long term (current) drug therapy: Secondary | ICD-10-CM | POA: Diagnosis not present

## 2018-03-09 DIAGNOSIS — I10 Essential (primary) hypertension: Secondary | ICD-10-CM | POA: Diagnosis not present

## 2018-03-10 LAB — BASIC METABOLIC PANEL
BUN / CREAT RATIO: 25 — AB (ref 9–23)
BUN: 18 mg/dL (ref 6–24)
CHLORIDE: 103 mmol/L (ref 96–106)
CO2: 24 mmol/L (ref 20–29)
Calcium: 9.1 mg/dL (ref 8.7–10.2)
Creatinine, Ser: 0.73 mg/dL (ref 0.57–1.00)
GFR calc non Af Amer: 103 mL/min/{1.73_m2} (ref >59)
GFR, EST AFRICAN AMERICAN: 119 mL/min/{1.73_m2} (ref >59)
GLUCOSE: 67 mg/dL (ref 65–99)
POTASSIUM: 4.3 mmol/L (ref 3.5–5.2)
SODIUM: 142 mmol/L (ref 134–144)

## 2018-03-10 LAB — HEMOGLOBIN A1C
Est. average glucose Bld gHb Est-mCnc: 114 mg/dL
HEMOGLOBIN A1C: 5.6 % (ref 4.8–5.6)

## 2018-03-10 LAB — LIPID PANEL
CHOL/HDL RATIO: 1.9 ratio (ref 0.0–4.4)
CHOLESTEROL TOTAL: 118 mg/dL (ref 100–199)
HDL: 62 mg/dL (ref >39)
LDL Calculated: 48 mg/dL (ref 0–99)
TRIGLYCERIDES: 41 mg/dL (ref 0–149)
VLDL Cholesterol Cal: 8 mg/dL (ref 5–40)

## 2018-03-10 LAB — HEPATIC FUNCTION PANEL
ALK PHOS: 57 IU/L (ref 39–117)
ALT: 10 IU/L (ref 0–32)
AST: 15 IU/L (ref 0–40)
Albumin: 4 g/dL (ref 3.5–5.5)
BILIRUBIN TOTAL: 0.4 mg/dL (ref 0.0–1.2)
BILIRUBIN, DIRECT: 0.16 mg/dL (ref 0.00–0.40)
Total Protein: 6.5 g/dL (ref 6.0–8.5)

## 2018-03-15 ENCOUNTER — Ambulatory Visit (INDEPENDENT_AMBULATORY_CARE_PROVIDER_SITE_OTHER): Payer: 59 | Admitting: Otolaryngology

## 2018-03-16 ENCOUNTER — Encounter: Payer: Self-pay | Admitting: Family Medicine

## 2018-03-16 ENCOUNTER — Ambulatory Visit: Payer: 59 | Admitting: Family Medicine

## 2018-03-16 VITALS — BP 128/82 | Ht 63.5 in | Wt 171.2 lb

## 2018-03-16 DIAGNOSIS — E1169 Type 2 diabetes mellitus with other specified complication: Secondary | ICD-10-CM | POA: Insufficient documentation

## 2018-03-16 DIAGNOSIS — Z794 Long term (current) use of insulin: Secondary | ICD-10-CM

## 2018-03-16 DIAGNOSIS — E119 Type 2 diabetes mellitus without complications: Secondary | ICD-10-CM | POA: Diagnosis not present

## 2018-03-16 DIAGNOSIS — I10 Essential (primary) hypertension: Secondary | ICD-10-CM

## 2018-03-16 DIAGNOSIS — L309 Dermatitis, unspecified: Secondary | ICD-10-CM

## 2018-03-16 DIAGNOSIS — E7849 Other hyperlipidemia: Secondary | ICD-10-CM | POA: Diagnosis not present

## 2018-03-16 DIAGNOSIS — E785 Hyperlipidemia, unspecified: Secondary | ICD-10-CM | POA: Insufficient documentation

## 2018-03-16 MED ORDER — TRIAMCINOLONE ACETONIDE 0.1 % EX CREA: 1.0000 "application " | TOPICAL_CREAM | Freq: Two times a day (BID) | CUTANEOUS | 4 refills | Status: DC

## 2018-03-16 MED ORDER — CITALOPRAM HYDROBROMIDE 20 MG PO TABS: ORAL_TABLET | ORAL | 1 refills | Status: DC

## 2018-03-16 MED ORDER — PANTOPRAZOLE SODIUM 40 MG PO TBEC: DELAYED_RELEASE_TABLET | ORAL | 1 refills | Status: DC

## 2018-03-16 MED ORDER — PRAVASTATIN SODIUM 20 MG PO TABS: ORAL_TABLET | ORAL | 3 refills | Status: DC

## 2018-03-16 MED ORDER — LISINOPRIL 10 MG PO TABS: 10.0000 mg | ORAL_TABLET | Freq: Every day | ORAL | 1 refills | Status: DC

## 2018-03-16 MED ORDER — DULAGLUTIDE 1.5 MG/0.5ML ~~LOC~~ SOAJ: SUBCUTANEOUS | 2 refills | Status: DC

## 2018-03-16 NOTE — Progress Notes (Signed)
   Subjective:    Patient ID: Emily Braun, female    DOB: 1975/09/16, 42 y.o.   MRN: [id number]  Diabetes  She presents for her follow-up diabetic visit. She has type 2 diabetes mellitus. Pertinent negatives for hypoglycemia include no confusion. Pertinent negatives for diabetes include no chest pain, no fatigue, no polydipsia, no polyphagia and no weakness. Risk factors for coronary artery disease include hypertension. Current diabetic treatment includes insulin injections. Her weight is stable. She is following a diabetic diet.   Very nice patient Following a diet essentially keto diet Doing very well with her sugars No low sugar spells Diabetic foot exam normal.   Review of Systems  Constitutional: Negative for activity change, appetite change and fatigue.  HENT: Negative for congestion.   Respiratory: Negative for cough.   Cardiovascular: Negative for chest pain.  Gastrointestinal: Negative for abdominal pain.  Endocrine: Negative for polydipsia and polyphagia.  Skin: Negative for color change.  Neurological: Negative for weakness.  Psychiatric/Behavioral: Negative for confusion.       Objective:   Physical Exam  Constitutional: She appears well-nourished. No distress.  Cardiovascular: Normal rate, regular rhythm and normal heart sounds.  No murmur heard. Pulmonary/Chest: Effort normal and breath sounds normal. No respiratory distress.  Musculoskeletal: She exhibits no edema.  Lymphadenopathy:    She has no cervical adenopathy.  Neurological: She is alert. She exhibits normal muscle tone.  Psychiatric: Her behavior is normal.  Vitals reviewed.         Assessment & Plan:  HTN- Patient was seen today as part of a visit regarding hypertension. The importance of healthy diet and regular physical activity was discussed. The importance of compliance with medications discussed.  Ideal goal is to keep blood pressure low elevated levels certainly below 867/67 when  possible.  The patient was counseled that keeping blood pressure under control lessen his risk of complications.  The importance of regular follow-ups was discussed with the patient.  Low-salt diet such as DASH recommended.  Regular physical activity was recommended as well.  Patient was advised to keep regular follow-ups.  The patient was seen today as part of a comprehensive visit for diabetes. The importance of keeping her A1c at or below 7 was discussed.  Importance of regular physical activity was discussed.   The importance of adherence to medication as well as a controlled low starch/sugar diet was also discussed.  Standard follow-up visit recommended.  Also patient aware failure to keep diabetes under control increases the risk of complications.  Mild obesity patient has done a very good job of losing weight  The patient was seen today as part of an evaluation regarding hyperlipidemia.  Recent lab work has been reviewed with the patient as well as the goals for good cholesterol care.  In addition to this medications have been discussed the importance of compliance with diet and medications discussed as well.  Finally the patient is aware that poor control of cholesterol, noncompliance can dramatically increase the risk of complications. The patient will keep regular office visits and the patient does agreed to periodic lab work.  Eczema uses triamcinolone for this

## 2018-03-22 ENCOUNTER — Encounter: Payer: Self-pay | Admitting: Family Medicine

## 2018-03-28 ENCOUNTER — Ambulatory Visit: Payer: 59 | Admitting: Urology

## 2018-03-28 DIAGNOSIS — N393 Stress incontinence (female) (male): Secondary | ICD-10-CM | POA: Diagnosis not present

## 2018-04-06 ENCOUNTER — Ambulatory Visit: Payer: 59 | Admitting: Family Medicine

## 2018-04-06 ENCOUNTER — Encounter: Payer: Self-pay | Admitting: Family Medicine

## 2018-04-06 VITALS — BP 130/82 | Temp 98.5°F | Ht 63.5 in | Wt 167.4 lb

## 2018-04-06 DIAGNOSIS — R300 Dysuria: Secondary | ICD-10-CM

## 2018-04-06 LAB — POCT URINALYSIS DIPSTICK: PH UA: 6 (ref 5.0–8.0)

## 2018-04-06 MED ORDER — FLUCONAZOLE 150 MG PO TABS: 150.0000 mg | ORAL_TABLET | Freq: Once | ORAL | 4 refills | Status: AC

## 2018-04-06 MED ORDER — NITROFURANTOIN MONOHYD MACRO 100 MG PO CAPS: 100.0000 mg | ORAL_CAPSULE | Freq: Two times a day (BID) | ORAL | 0 refills | Status: DC

## 2018-04-06 NOTE — Progress Notes (Signed)
   Subjective:    Patient ID: Emily Braun, female    DOB: [birth year], 42 y.o.   MRN: [id number]  HPI Pt here today for bladder infection. Symptoms include burning while urination. Started yesterday.  Patient with dysuria urinary frequency slight urgency denies high fever chills sweats vomiting diarrhea Results for orders placed or performed in visit on 04/06/18  POCT Urinalysis Dipstick  Result Value Ref Range   Color, UA     Clarity, UA     Glucose, UA     Bilirubin, UA +    Ketones, UA     Spec Grav, UA >=1.030 (A) 1.010 - 1.025   Blood, UA     pH, UA 6.0 5.0 - 8.0   Protein, UA     Urobilinogen, UA     Nitrite, UA     Leukocytes, UA Trace (A) Negative   Appearance     Odor      Review of Systems  Constitutional: Negative for activity change and appetite change.  HENT: Negative for congestion and rhinorrhea.   Respiratory: Negative for cough and shortness of breath.   Cardiovascular: Negative for chest pain and leg swelling.  Gastrointestinal: Negative for abdominal pain, nausea and vomiting.  Skin: Negative for color change.  Neurological: Negative for dizziness and weakness.  Psychiatric/Behavioral: Negative for agitation and confusion.       Objective:   Physical Exam  Constitutional: She appears well-nourished. No distress.  HENT:  Head: Normocephalic and atraumatic.  Eyes: Right eye exhibits no discharge. Left eye exhibits no discharge.  Neck: No tracheal deviation present.  Cardiovascular: Normal rate, regular rhythm and normal heart sounds.  No murmur heard. Pulmonary/Chest: Effort normal and breath sounds normal. No respiratory distress.  Musculoskeletal: She exhibits no edema.  Lymphadenopathy:    She has no cervical adenopathy.  Neurological: She is alert. Coordination normal.  Skin: Skin is warm and dry.  Psychiatric: She has a normal mood and affect. Her behavior is normal.  Vitals reviewed.    No complications noted     Assessment & Plan:    UTI Urine culture sent Antibiotic prescribed Follow-up if progressive troubles or if worse Macrobid twice daily for 7 days

## 2018-04-08 LAB — URINE CULTURE

## 2018-04-15 ENCOUNTER — Other Ambulatory Visit: Payer: Self-pay | Admitting: Family Medicine

## 2018-04-20 DIAGNOSIS — R102 Pelvic and perineal pain: Secondary | ICD-10-CM | POA: Diagnosis not present

## 2018-04-20 DIAGNOSIS — M62838 Other muscle spasm: Secondary | ICD-10-CM | POA: Diagnosis not present

## 2018-05-02 ENCOUNTER — Other Ambulatory Visit: Payer: Self-pay | Admitting: Family Medicine

## 2018-05-03 NOTE — Telephone Encounter (Signed)
Patient had this way. Document letting her know that anti-inflammatories and citalopram can slightly increase her risk of GI bleed if anti-inflammatory medication bothers her stomach she needs to stop the medicine right away

## 2018-05-04 NOTE — Telephone Encounter (Signed)
Patient is aware of all. Do we refill it?

## 2018-05-28 ENCOUNTER — Other Ambulatory Visit: Payer: Self-pay | Admitting: *Deleted

## 2018-05-28 MED ORDER — PRAVASTATIN SODIUM 20 MG PO TABS: ORAL_TABLET | ORAL | 3 refills | Status: DC

## 2018-05-31 ENCOUNTER — Ambulatory Visit (INDEPENDENT_AMBULATORY_CARE_PROVIDER_SITE_OTHER): Payer: 59 | Admitting: Otolaryngology

## 2018-05-31 DIAGNOSIS — J310 Chronic rhinitis: Secondary | ICD-10-CM | POA: Diagnosis not present

## 2018-06-01 DIAGNOSIS — M62838 Other muscle spasm: Secondary | ICD-10-CM | POA: Diagnosis not present

## 2018-06-01 DIAGNOSIS — R102 Pelvic and perineal pain: Secondary | ICD-10-CM | POA: Diagnosis not present

## 2018-06-01 DIAGNOSIS — M6281 Muscle weakness (generalized): Secondary | ICD-10-CM | POA: Diagnosis not present

## 2018-06-04 ENCOUNTER — Other Ambulatory Visit: Payer: Self-pay | Admitting: Family Medicine

## 2018-06-15 DIAGNOSIS — N3946 Mixed incontinence: Secondary | ICD-10-CM | POA: Diagnosis not present

## 2018-06-15 DIAGNOSIS — M62838 Other muscle spasm: Secondary | ICD-10-CM | POA: Diagnosis not present

## 2018-06-15 DIAGNOSIS — M6281 Muscle weakness (generalized): Secondary | ICD-10-CM | POA: Diagnosis not present

## 2018-06-27 ENCOUNTER — Ambulatory Visit (INDEPENDENT_AMBULATORY_CARE_PROVIDER_SITE_OTHER): Payer: 59 | Admitting: Urology

## 2018-06-27 DIAGNOSIS — N393 Stress incontinence (female) (male): Secondary | ICD-10-CM | POA: Diagnosis not present

## 2018-07-09 ENCOUNTER — Other Ambulatory Visit: Payer: Self-pay | Admitting: Family Medicine

## 2018-07-20 DIAGNOSIS — M6281 Muscle weakness (generalized): Secondary | ICD-10-CM | POA: Diagnosis not present

## 2018-07-20 DIAGNOSIS — M62838 Other muscle spasm: Secondary | ICD-10-CM | POA: Diagnosis not present

## 2018-07-20 DIAGNOSIS — N3946 Mixed incontinence: Secondary | ICD-10-CM | POA: Diagnosis not present

## 2018-07-30 ENCOUNTER — Telehealth: Payer: Self-pay | Admitting: Family Medicine

## 2018-07-30 NOTE — Telephone Encounter (Signed)
Pt calling in regards of a discussion that was taken place at last visit on her foster child Lorne Skeens DOB 11/01/2017.  She states as there was a discussion of when she's ready that Dr.Scott would except him as a new pt. Advise.   Foster Child's current PCP: Mannie Stabile MD

## 2018-07-30 NOTE — Telephone Encounter (Signed)
Please inform Emogene I will be happy to take on her child as a new patient  Please assist her with getting established If the child is on Medicaid she may have to get the primary care name changed to list [place] as a primary care provider

## 2018-07-31 NOTE — Telephone Encounter (Signed)
Put new patient name in book upfront.Emily Braun -[birth year]

## 2018-08-05 ENCOUNTER — Other Ambulatory Visit: Payer: Self-pay | Admitting: Family Medicine

## 2018-08-08 NOTE — Telephone Encounter (Signed)
May have this +2 refills Please talk with patient make sure that she is aware that anti-inflammatories with antidepressants can increase the risk of ulcers.  If she should start having abdominal pain with any blood in the stools black tarry stools or vomiting coffee grounds she needs to seek care right away chances of an ulcer are not high but are possible with this combination

## 2018-08-09 NOTE — Telephone Encounter (Signed)
Left message on vm asked that the pt r/c.

## 2018-08-17 ENCOUNTER — Ambulatory Visit: Payer: 59 | Admitting: Family Medicine

## 2018-08-28 ENCOUNTER — Encounter: Payer: Self-pay | Admitting: Family Medicine

## 2018-08-28 ENCOUNTER — Ambulatory Visit: Payer: 59 | Admitting: Family Medicine

## 2018-08-28 VITALS — BP 118/72 | Temp 99.1°F | Ht 63.5 in | Wt 174.0 lb

## 2018-08-28 DIAGNOSIS — B9689 Other specified bacterial agents as the cause of diseases classified elsewhere: Secondary | ICD-10-CM

## 2018-08-28 DIAGNOSIS — J019 Acute sinusitis, unspecified: Secondary | ICD-10-CM

## 2018-08-28 MED ORDER — AMOXICILLIN-POT CLAVULANATE 875-125 MG PO TABS: 1.0000 | ORAL_TABLET | Freq: Two times a day (BID) | ORAL | 0 refills | Status: DC

## 2018-08-28 NOTE — Progress Notes (Signed)
   Subjective:    Patient ID: Emily Braun, female    DOB: Aug 09, 1976, 42 y.o.   MRN: [id number]  Sinusitis  This is a new problem. Episode onset: 5 days. Associated symptoms include congestion, coughing, ear pain, headaches and a sore throat. Pertinent negatives include no shortness of breath. (Abdominal pain, eye drainage) Treatments tried: diabetic tussin.   Congestion symptoms over the past several days progressive sinus pressure pain discomfort no wheezing or difficulty breathing no sweats chills or fever   Review of Systems  Constitutional: Negative for activity change and fever.  HENT: Positive for congestion, ear pain, rhinorrhea and sore throat.   Eyes: Negative for discharge.  Respiratory: Positive for cough. Negative for shortness of breath and wheezing.   Cardiovascular: Negative for chest pain.  Neurological: Positive for headaches.       Objective:   Physical Exam Vitals signs and nursing note reviewed.  Constitutional:      Appearance: She is well-developed.  HENT:     Head: Normocephalic.     Nose: Nose normal.     Mouth/Throat:     Pharynx: No oropharyngeal exudate.  Neck:     Musculoskeletal: Neck supple.  Cardiovascular:     Rate and Rhythm: Normal rate.     Heart sounds: Normal heart sounds. No murmur.  Pulmonary:     Effort: Pulmonary effort is normal.     Breath sounds: Normal breath sounds. No wheezing.  Lymphadenopathy:     Cervical: No cervical adenopathy.  Skin:    General: Skin is warm and dry.           Assessment & Plan:  Viral syndrome I believe there is secondary sinus infection Patient was seen today for upper respiratory illness. It is felt that the patient is dealing with sinusitis.  Antibiotics were prescribed today. Importance of compliance with medication was discussed.  Symptoms should gradually resolve over the course of the next several days. If high fevers, progressive illness, difficulty breathing, worsening condition  or failure for symptoms to improve over the next several days then the patient is to follow-up.  If any emergent conditions the patient is to follow-up in the emergency department otherwise to follow-up in the office.

## 2018-09-12 ENCOUNTER — Other Ambulatory Visit: Payer: Self-pay | Admitting: *Deleted

## 2018-09-12 DIAGNOSIS — Z794 Long term (current) use of insulin: Secondary | ICD-10-CM

## 2018-09-12 DIAGNOSIS — E7849 Other hyperlipidemia: Secondary | ICD-10-CM

## 2018-09-12 DIAGNOSIS — I10 Essential (primary) hypertension: Secondary | ICD-10-CM | POA: Diagnosis not present

## 2018-09-12 DIAGNOSIS — E119 Type 2 diabetes mellitus without complications: Secondary | ICD-10-CM | POA: Diagnosis not present

## 2018-09-13 LAB — BASIC METABOLIC PANEL
BUN/Creatinine Ratio: 19 (ref 9–23)
BUN: 13 mg/dL (ref 6–24)
CO2: 24 mmol/L (ref 20–29)
CREATININE: 0.67 mg/dL (ref 0.57–1.00)
Calcium: 10 mg/dL (ref 8.7–10.2)
Chloride: 101 mmol/L (ref 96–106)
GFR calc Af Amer: 126 mL/min/{1.73_m2} (ref >59)
GFR calc non Af Amer: 110 mL/min/{1.73_m2} (ref >59)
Glucose: 89 mg/dL (ref 65–99)
Potassium: 4.7 mmol/L (ref 3.5–5.2)
Sodium: 144 mmol/L (ref 134–144)

## 2018-09-13 LAB — LIPID PANEL
CHOL/HDL RATIO: 1.8 ratio (ref 0.0–4.4)
Cholesterol, Total: 123 mg/dL (ref 100–199)
HDL: 70 mg/dL (ref >39)
LDL Calculated: 44 mg/dL (ref 0–99)
Triglycerides: 47 mg/dL (ref 0–149)
VLDL Cholesterol Cal: 9 mg/dL (ref 5–40)

## 2018-09-13 LAB — HEPATIC FUNCTION PANEL
ALT: 17 IU/L (ref 0–32)
AST: 15 IU/L (ref 0–40)
Albumin: 4.5 g/dL (ref 3.5–5.5)
Alkaline Phosphatase: 70 IU/L (ref 39–117)
BILIRUBIN TOTAL: 0.3 mg/dL (ref 0.0–1.2)
Bilirubin, Direct: 0.12 mg/dL (ref 0.00–0.40)
Total Protein: 7.2 g/dL (ref 6.0–8.5)

## 2018-09-13 LAB — HEMOGLOBIN A1C
Est. average glucose Bld gHb Est-mCnc: 114 mg/dL
Hgb A1c MFr Bld: 5.6 % (ref 4.8–5.6)

## 2018-09-14 ENCOUNTER — Ambulatory Visit: Payer: 59 | Admitting: Family Medicine

## 2018-09-14 ENCOUNTER — Encounter: Payer: Self-pay | Admitting: Family Medicine

## 2018-09-14 VITALS — BP 118/74 | Ht 63.5 in | Wt 168.4 lb

## 2018-09-14 DIAGNOSIS — I10 Essential (primary) hypertension: Secondary | ICD-10-CM

## 2018-09-14 DIAGNOSIS — K219 Gastro-esophageal reflux disease without esophagitis: Secondary | ICD-10-CM | POA: Diagnosis not present

## 2018-09-14 DIAGNOSIS — E7849 Other hyperlipidemia: Secondary | ICD-10-CM

## 2018-09-14 DIAGNOSIS — J019 Acute sinusitis, unspecified: Secondary | ICD-10-CM

## 2018-09-14 DIAGNOSIS — Z794 Long term (current) use of insulin: Secondary | ICD-10-CM

## 2018-09-14 DIAGNOSIS — E119 Type 2 diabetes mellitus without complications: Secondary | ICD-10-CM

## 2018-09-14 DIAGNOSIS — B9689 Other specified bacterial agents as the cause of diseases classified elsewhere: Secondary | ICD-10-CM

## 2018-09-14 MED ORDER — DULAGLUTIDE 1.5 MG/0.5ML ~~LOC~~ SOAJ: SUBCUTANEOUS | 5 refills | Status: DC

## 2018-09-14 MED ORDER — PRAVASTATIN SODIUM 20 MG PO TABS: ORAL_TABLET | ORAL | 3 refills | Status: DC

## 2018-09-14 MED ORDER — LISINOPRIL 10 MG PO TABS: 10.0000 mg | ORAL_TABLET | Freq: Every day | ORAL | 1 refills | Status: DC

## 2018-09-14 MED ORDER — PANTOPRAZOLE SODIUM 40 MG PO TBEC: DELAYED_RELEASE_TABLET | ORAL | 1 refills | Status: DC

## 2018-09-14 MED ORDER — DOXYCYCLINE HYCLATE 100 MG PO TABS: 100.0000 mg | ORAL_TABLET | Freq: Two times a day (BID) | ORAL | 0 refills | Status: DC

## 2018-09-14 NOTE — Progress Notes (Signed)
Subjective:    Patient ID: Emily Braun, female    DOB: 1975-11-15, 43 y.o.   MRN: [id number]  Diabetes  She presents for her follow-up diabetic visit. She has type 2 diabetes mellitus. Pertinent negatives for hypoglycemia include no confusion or dizziness. Pertinent negatives for diabetes include no chest pain, no fatigue, no polydipsia, no polyphagia and no weakness. She is compliant with treatment all of the time. She does not see a podiatrist.Eye exam is current (sept 2019).  A1C 5.6 on bloodwork.   Finished augmentin one week ago  and still having cough and congestion. Feeling about the same. Sinus pressure is gone.   Patient for blood pressure check up.  The patient does have hypertension.  The patient is on medication.  Patient relates compliance with meds. Todays BP reviewed with the patient. Patient denies issues with medication. Patient relates reasonable diet. Patient tries to minimize salt. Patient aware of BP goals.  Patient here for follow-up regarding cholesterol.  The patient does have hyperlipidemia.  Patient does try to maintain a reasonable diet.  Patient does take the medication on a regular basis.  Denies missing a dose.  The patient denies any obvious side effects.  Prior blood work results reviewed with the patient.  The patient is aware of his cholesterol goals and the need to keep it under good control to lessen the risk of disease.  Patient does have ongoing trouble with reflux.  Takes medication on a regular basis.  Tries to minimize foods as best they can.  They understand the importance of dietary compliance.  May also try to avoid eating a large meal close to bedtime.  Patient denies any dysphagia denies hematochezia.  States medicine does a good job keeping the problem under good control.  Without the medication may certainly have issues.They desire to continue taking their medication.   Still having head congestion drainage coughing denies high fever chills  sweats Review of Systems  Constitutional: Negative for activity change, appetite change and fatigue.  HENT: Negative for congestion and rhinorrhea.   Respiratory: Negative for cough and shortness of breath.   Cardiovascular: Negative for chest pain and leg swelling.  Gastrointestinal: Negative for abdominal pain and diarrhea.  Endocrine: Negative for polydipsia and polyphagia.  Skin: Negative for color change.  Neurological: Negative for dizziness and weakness.  Psychiatric/Behavioral: Negative for behavioral problems and confusion.       Objective:   Physical Exam Vitals signs reviewed.  Constitutional:      General: She is not in acute distress. HENT:     Head: Normocephalic and atraumatic.  Eyes:     General:        Right eye: No discharge.        Left eye: No discharge.  Neck:     Trachea: No tracheal deviation.  Cardiovascular:     Rate and Rhythm: Normal rate and regular rhythm.     Heart sounds: Normal heart sounds. No murmur.  Pulmonary:     Effort: Pulmonary effort is normal. No respiratory distress.     Breath sounds: Normal breath sounds.  Lymphadenopathy:     Cervical: No cervical adenopathy.  Skin:    General: Skin is warm and dry.  Neurological:     Mental Status: She is alert.     Coordination: Coordination normal.  Psychiatric:        Behavior: Behavior normal.           Assessment & Plan:  Diabetes under  good control she can keep tapering down on her insulin she is on a keto diet and doing very well keeping her weight down and sugars under good control she is watching her sugars are intermittently  The patient was seen today as part of an evaluation regarding hyperlipidemia.  Recent lab work has been reviewed with the patient as well as the goals for good cholesterol care.  In addition to this medications have been discussed the importance of compliance with diet and medications discussed as well.  Finally the patient is aware that poor control  of cholesterol, noncompliance can dramatically increase the risk of complications. The patient will keep regular office visits and the patient does agreed to periodic lab work.  HTN- Patient was seen today as part of a visit regarding hypertension. The importance of healthy diet and regular physical activity was discussed. The importance of compliance with medications discussed.  Ideal goal is to keep blood pressure low elevated levels certainly below 975/30 when possible.  The patient was counseled that keeping blood pressure under control lessen his risk of complications.  The importance of regular follow-ups was discussed with the patient.  Low-salt diet such as DASH recommended.  Regular physical activity was recommended as well.  Patient was advised to keep regular follow-ups.  The patient was seen today for GERD. Patient benefits from medication. Patient to continue medication. Keep all regular follow ups. She will try tapering off on her medication  Patient was seen today for upper respiratory illness. It is felt that the patient is dealing with sinusitis.  Antibiotics were prescribed today. Importance of compliance with medication was discussed.  Symptoms should gradually resolve over the course of the next several days. If high fevers, progressive illness, difficulty breathing, worsening condition or failure for symptoms to improve over the next several days then the patient is to follow-up.  If any emergent conditions the patient is to follow-up in the emergency department otherwise to follow-up in the office. We will go ahead with doxycycline with a snack in a tall glass of water  Lab work and follow-up 6 months  25 minutes was spent with the patient.  This statement verifies that 25 minutes was indeed spent with the patient.  More than 50% of this visit-total duration of the visit-was spent in counseling and coordination of care. The issues that the patient came in for today as  reflected in the diagnosis (s) please refer to documentation for further details.

## 2018-09-17 ENCOUNTER — Ambulatory Visit: Payer: 59 | Admitting: Family Medicine

## 2018-09-26 ENCOUNTER — Ambulatory Visit: Payer: 59 | Admitting: Urology

## 2018-10-01 ENCOUNTER — Telehealth: Payer: Self-pay | Admitting: *Deleted

## 2018-10-01 NOTE — Telephone Encounter (Signed)
Fax from [employer] flextouch injection not preferred. Insurance prefers tresiba or [employer]. Do you want to change or do PA?

## 2018-10-07 NOTE — Telephone Encounter (Signed)
Typically we recommend Emily Braun it works very similar.  It is a once a day injectable.  Please talk with the patient make sure she is aware of this issue.  She should do fine on the new insulin.  We might have to fine-tune it.  Please confirm how much she is using currently of the Levemir.  If she is on board with this we will go ahead and make the changes.

## 2018-10-08 ENCOUNTER — Other Ambulatory Visit: Payer: Self-pay | Admitting: Family Medicine

## 2018-10-08 MED ORDER — BASAGLAR KWIKPEN 100 UNIT/ML ~~LOC~~ SOPN: PEN_INJECTOR | SUBCUTANEOUS | 6 refills | Status: DC

## 2018-10-08 NOTE — Telephone Encounter (Signed)
Basaglar pen- 30 units nightly may titrate up to 55 units max to keep sugars under good control May have 30-day with 6 months refill  Discontinue Levemir Patient to let us know if any ongoing issues or problems

## 2018-10-08 NOTE — Telephone Encounter (Signed)
Pt contacted. Pt states she is OK with trying this medication. Pt states she is currently taking 30-40 units once a day. Pt verbalized understanding.

## 2018-10-08 NOTE — Telephone Encounter (Signed)
Medication sent in and patient is aware. Pt verbalized understanding.

## 2018-10-10 ENCOUNTER — Ambulatory Visit: Payer: 59 | Admitting: Family Medicine

## 2018-10-10 VITALS — Temp 104.0°F | Wt 174.6 lb

## 2018-10-10 DIAGNOSIS — J111 Influenza due to unidentified influenza virus with other respiratory manifestations: Secondary | ICD-10-CM | POA: Diagnosis not present

## 2018-10-10 DIAGNOSIS — J019 Acute sinusitis, unspecified: Secondary | ICD-10-CM | POA: Diagnosis not present

## 2018-10-10 MED ORDER — AMOXICILLIN 500 MG PO TABS: 500.0000 mg | ORAL_TABLET | Freq: Three times a day (TID) | ORAL | 0 refills | Status: DC

## 2018-10-10 NOTE — Patient Instructions (Signed)

## 2018-10-10 NOTE — Progress Notes (Signed)
   Subjective:    Patient ID: Emily Braun, female    DOB: [birth year], 43 y.o.   MRN: [id number]  Fever   This is a new problem. The current episode started in the past 7 days. Associated symptoms include congestion, coughing and headaches. Pertinent negatives include no chest pain, ear pain or wheezing. Treatments tried: tussin.   4-day history of head congestion drainage coughing not feeling good in addition to this with some body aches and feeling rundown Started running fever today got up to 104.  No wheezing difficulty breathing no vomiting or diarrhea PMH is benign   Review of Systems  Constitutional: Positive for fever. Negative for activity change.  HENT: Positive for congestion and rhinorrhea. Negative for ear pain.   Eyes: Negative for discharge.  Respiratory: Positive for cough. Negative for shortness of breath and wheezing.   Cardiovascular: Negative for chest pain.  Neurological: Positive for headaches.       Objective:   Physical Exam Vitals signs and nursing note reviewed.  Constitutional:      Appearance: She is well-developed.  HENT:     Head: Normocephalic.     Nose: Nose normal.     Mouth/Throat:     Pharynx: No oropharyngeal exudate.  Neck:     Musculoskeletal: Neck supple.  Cardiovascular:     Rate and Rhythm: Normal rate.     Heart sounds: Normal heart sounds. No murmur.  Pulmonary:     Effort: Pulmonary effort is normal.     Breath sounds: Normal breath sounds. No wheezing.  Lymphadenopathy:     Cervical: No cervical adenopathy.  Skin:    General: Skin is warm and dry.           Assessment & Plan:  Influenza Acute rhinosinusitis Supportive measures discussed amoxicillin 10 days Warning signs discussed Rest up over the next several days follow-up if progressive troubles or worse

## 2018-11-07 ENCOUNTER — Ambulatory Visit: Payer: 59 | Admitting: Urology

## 2018-11-07 DIAGNOSIS — N393 Stress incontinence (female) (male): Secondary | ICD-10-CM

## 2018-11-09 ENCOUNTER — Other Ambulatory Visit: Payer: Self-pay

## 2018-11-09 ENCOUNTER — Encounter: Payer: Self-pay | Admitting: Family Medicine

## 2018-11-09 ENCOUNTER — Ambulatory Visit (INDEPENDENT_AMBULATORY_CARE_PROVIDER_SITE_OTHER): Payer: 59 | Admitting: Family Medicine

## 2018-11-09 VITALS — BP 126/84 | Ht 63.5 in | Wt 174.6 lb

## 2018-11-09 DIAGNOSIS — Z0000 Encounter for general adult medical examination without abnormal findings: Secondary | ICD-10-CM | POA: Diagnosis not present

## 2018-11-09 NOTE — Patient Instructions (Signed)
Preventive Care 40-64 Years, Female Preventive care refers to lifestyle choices and visits with your health care provider that can promote health and wellness. What does preventive care include?   A yearly physical exam. This is also called an annual well check.  Dental exams once or twice a year.  Routine eye exams. Ask your health care provider how often you should have your eyes checked.  Personal lifestyle choices, including: ? Daily care of your teeth and gums. ? Regular physical activity. ? Eating a healthy diet. ? Avoiding tobacco and drug use. ? Limiting alcohol use. ? Practicing safe sex. ? Taking low-dose aspirin daily starting at age 50. ? Taking vitamin and mineral supplements as recommended by your health care provider. What happens during an annual well check? The services and screenings done by your health care provider during your annual well check will depend on your age, overall health, lifestyle risk factors, and family history of disease. Counseling Your health care provider may ask you questions about your:  Alcohol use.  Tobacco use.  Drug use.  Emotional well-being.  Home and relationship well-being.  Sexual activity.  Eating habits.  Work and work environment.  Method of birth control.  Menstrual cycle.  Pregnancy history. Screening You may have the following tests or measurements:  Height, weight, and BMI.  Blood pressure.  Lipid and cholesterol levels. These may be checked every 5 years, or more frequently if you are over 50 years old.  Skin check.  Lung cancer screening. You may have this screening every year starting at age 55 if you have a 30-pack-year history of smoking and currently smoke or have quit within the past 15 years.  Colorectal cancer screening. All adults should have this screening starting at age 50 and continuing until age 75. Your health care provider may recommend screening at age 45. You will have tests every  1-10 years, depending on your results and the type of screening test. People at increased risk should start screening at an earlier age. Screening tests may include: ? Guaiac-based fecal occult blood testing. ? Fecal immunochemical test (FIT). ? Stool DNA test. ? Virtual colonoscopy. ? Sigmoidoscopy. During this test, a flexible tube with a tiny camera (sigmoidoscope) is used to examine your rectum and lower colon. The sigmoidoscope is inserted through your anus into your rectum and lower colon. ? Colonoscopy. During this test, a long, thin, flexible tube with a tiny camera (colonoscope) is used to examine your entire colon and rectum.  Hepatitis C blood test.  Hepatitis B blood test.  Sexually transmitted disease (STD) testing.  Diabetes screening. This is done by checking your blood sugar (glucose) after you have not eaten for a while (fasting). You may have this done every 1-3 years.  Mammogram. This may be done every 1-2 years. Talk to your health care provider about when you should start having regular mammograms. This may depend on whether you have a family history of breast cancer.  BRCA-related cancer screening. This may be done if you have a family history of breast, ovarian, tubal, or peritoneal cancers.  Pelvic exam and Pap test. This may be done every 3 years starting at age 21. Starting at age 30, this may be done every 5 years if you have a Pap test in combination with an HPV test.  Bone density scan. This is done to screen for osteoporosis. You may have this scan if you are at high risk for osteoporosis. Discuss your test results, treatment options,   and if necessary, the need for more tests with your health care provider. Vaccines Your health care provider may recommend certain vaccines, such as:  Influenza vaccine. This is recommended every year.  Tetanus, diphtheria, and acellular pertussis (Tdap, Td) vaccine. You may need a Td booster every 10 years.  Varicella  vaccine. You may need this if you have not been vaccinated.  Zoster vaccine. You may need this after age 38.  Measles, mumps, and rubella (MMR) vaccine. You may need at least one dose of MMR if you were born in 1957 or later. You may also need a second dose.  Pneumococcal 13-valent conjugate (PCV13) vaccine. You may need this if you have certain conditions and were not previously vaccinated.  Pneumococcal polysaccharide (PPSV23) vaccine. You may need one or two doses if you smoke cigarettes or if you have certain conditions.  Meningococcal vaccine. You may need this if you have certain conditions.  Hepatitis A vaccine. You may need this if you have certain conditions or if you travel or work in places where you may be exposed to hepatitis A.  Hepatitis B vaccine. You may need this if you have certain conditions or if you travel or work in places where you may be exposed to hepatitis B.  Haemophilus influenzae type b (Hib) vaccine. You may need this if you have certain conditions. Talk to your health care provider about which screenings and vaccines you need and how often you need them. This information is not intended to replace advice given to you by your health care provider. Make sure you discuss any questions you have with your health care provider. Document Released: 09/11/2015 Document Revised: 10/05/2017 Document Reviewed: 06/16/2015 Elsevier Interactive Patient Education  2019 [employer].

## 2018-11-09 NOTE — Progress Notes (Signed)
Subjective:    Patient ID: Emily Braun, female    DOB: 02-19-1976, 43 y.o.   MRN: [id number]  HPI  The patient comes in today for a wellness visit.  A review of their health history was completed. A review of medications was also completed.  Any needed refills; no  Eating habits: doing keto - has lost 40 lbs in the last year doing this.   Falls/  MVA accidents in past few months: no  Regular exercise: yes- walking  Specialist pt sees on regular basis: urology  Preventative health issues were discussed. Pap smear done last year and normal. Mammogram due in April 2020.   Additional concerns: no   LMP: about 3 weeks ago, regular cycles. On OCPs, pt would like to come off of, she is not sexually active, was originally put on for birth control purposes.    Review of Systems  Constitutional: Negative for chills, fatigue, fever and unexpected weight change.  HENT: Negative for congestion, ear pain, sinus pressure, sinus pain and sore throat.   Eyes: Negative for discharge and visual disturbance.  Respiratory: Negative for cough, shortness of breath and wheezing.   Cardiovascular: Negative for chest pain and leg swelling.  Gastrointestinal: Negative for abdominal pain, blood in stool, constipation, diarrhea, nausea and vomiting.  Genitourinary: Negative for difficulty urinating, hematuria, pelvic pain, vaginal bleeding and vaginal discharge.  Skin: Negative for color change.  Neurological: Negative for dizziness, weakness, light-headedness and headaches.  Hematological: Negative for adenopathy.  Psychiatric/Behavioral: Negative for dysphoric mood and suicidal ideas.  All other systems reviewed and are negative.      Objective:   Physical Exam Vitals signs and nursing note reviewed. Exam conducted with a chaperone present.  Constitutional:      General: She is not in acute distress.    Appearance: She is well-developed.  HENT:     Head: Normocephalic and atraumatic.     Right Ear: Tympanic membrane normal.     Left Ear: Tympanic membrane normal.     Nose: Nose normal.     Mouth/Throat:     Mouth: Mucous membranes are moist.     Pharynx: Oropharynx is clear. Uvula midline.  Eyes:     General:        Right eye: No discharge.        Left eye: No discharge.     Conjunctiva/sclera: Conjunctivae normal.     Pupils: Pupils are equal, round, and reactive to light.  Neck:     Musculoskeletal: Neck supple.     Thyroid: No thyromegaly.  Cardiovascular:     Rate and Rhythm: Normal rate and regular rhythm.     Heart sounds: Normal heart sounds. No murmur.  Pulmonary:     Effort: Pulmonary effort is normal. No respiratory distress.     Breath sounds: Normal breath sounds. No wheezing.  Chest:     Breasts: Breasts are symmetrical.        Right: Normal.        Left: Normal.  Abdominal:     General: Bowel sounds are normal. There is no distension.     Palpations: Abdomen is soft. There is no mass.     Tenderness: There is no abdominal tenderness.  Genitourinary:    General: Normal vulva.     Vagina: Normal.     Cervix: Normal.     Uterus: Normal.      Adnexa: Right adnexa normal and left adnexa normal.     Comments:  No tenderness or obvious masses on bimanual exam, exam limited d/t abdominal girth Musculoskeletal:        General: No deformity.  Lymphadenopathy:     Cervical: No cervical adenopathy.     Upper Body:     Right upper body: No supraclavicular or axillary adenopathy.     Left upper body: No supraclavicular or axillary adenopathy.  Skin:    General: Skin is warm and dry.  Neurological:     Mental Status: She is alert and oriented to person, place, and time.     Coordination: Coordination normal.  Psychiatric:        Mood and Affect: Mood normal.        Behavior: Behavior normal.           Assessment & Plan:  Well adult exam Adult wellness-complete.wellness physical was conducted today. Importance of diet and exercise were  discussed in detail.  In addition to this a discussion regarding safety was also covered. We also reviewed over immunizations and gave recommendations regarding current immunization needed for age.  In addition to this additional areas were also touched on including: Preventative health exams needed:  Colonoscopy N/A Mammogram due in April 2020, pt will schedule Pap smear UTD  Patient was advised yearly wellness exam.   Discussed increased risk of blood clots over the age of 9 on COCs, pt states she is not sexually active and was originally placed on pills for birth control and would like to come off of them anyways. If she has problems with her cycles after coming off the birth control then she should notify [place] and we can always restart same pill or discuss other options such as IUD. Pt is in agreement with this plan.   She should keep f/u appointment in July with Dr. Nicki Reaper for her chronic issues.

## 2018-11-13 ENCOUNTER — Other Ambulatory Visit: Payer: Self-pay | Admitting: Family Medicine

## 2018-11-14 NOTE — Telephone Encounter (Signed)
Refilled celexa, pt should keep f/u appointment with Dr. Nicki Reaper in July

## 2018-11-27 ENCOUNTER — Encounter: Payer: Self-pay | Admitting: Family Medicine

## 2018-11-27 NOTE — Telephone Encounter (Signed)
I would not worry about the yellow tent in the skin more than likely this is due to beta-carotene's As his diet diversify as this will become less prominent  As for the formula it is fine to continue with the formula but I would encourage making a very gradual transition towards formula it may take up to 3 weeks to make that transition  Essentially using a small amount of milk mixed in with the majority of formula and gradually every couple days shift the percentage so there is more milk in the bottle and less formula.  I do recommend trying some finger foods and foods that have texture and baby foods that have texture it is important to make that transition over the next 45 days toward more textured foods even if they are cut up and pureed  If ongoing trouble we can do a telephone visit  Nurses-please inform patient of this Please cut and paste the message into the child's chart under a telephone message And if possible please educate Khali on how to set up my chart messaging for [place]

## 2018-12-21 DIAGNOSIS — R350 Frequency of micturition: Secondary | ICD-10-CM | POA: Diagnosis not present

## 2018-12-21 DIAGNOSIS — N3946 Mixed incontinence: Secondary | ICD-10-CM | POA: Diagnosis not present

## 2019-01-03 ENCOUNTER — Encounter: Payer: Self-pay | Admitting: Family Medicine

## 2019-01-07 ENCOUNTER — Telehealth: Payer: Self-pay | Admitting: Family Medicine

## 2019-01-07 NOTE — Telephone Encounter (Signed)
Pt dropped off Medical Eval form for [facility]  Form placed in nurse box at nurse station

## 2019-01-14 NOTE — Telephone Encounter (Signed)
This was filled out ,form in chair to go to front

## 2019-02-05 ENCOUNTER — Other Ambulatory Visit (HOSPITAL_COMMUNITY): Payer: Self-pay | Admitting: Family Medicine

## 2019-02-05 DIAGNOSIS — Z1231 Encounter for screening mammogram for malignant neoplasm of breast: Secondary | ICD-10-CM

## 2019-02-08 ENCOUNTER — Ambulatory Visit (HOSPITAL_COMMUNITY): Payer: 59

## 2019-02-14 ENCOUNTER — Telehealth: Payer: Self-pay | Admitting: Family Medicine

## 2019-02-14 NOTE — Telephone Encounter (Signed)
ERRor

## 2019-02-15 ENCOUNTER — Ambulatory Visit (HOSPITAL_COMMUNITY)
Admission: RE | Admit: 2019-02-15 | Discharge: 2019-02-15 | Disposition: A | Discharge status: Home / Self Care | Payer: 59 | Source: Ambulatory Visit | Attending: Family Medicine | Admitting: Family Medicine

## 2019-02-15 ENCOUNTER — Other Ambulatory Visit: Payer: Self-pay

## 2019-02-15 DIAGNOSIS — Z1231 Encounter for screening mammogram for malignant neoplasm of breast: Secondary | ICD-10-CM | POA: Insufficient documentation

## 2019-02-19 ENCOUNTER — Encounter: Payer: Self-pay | Admitting: Family Medicine

## 2019-03-08 ENCOUNTER — Telehealth: Payer: Self-pay | Admitting: Family Medicine

## 2019-03-08 ENCOUNTER — Other Ambulatory Visit: Payer: 59

## 2019-03-08 ENCOUNTER — Other Ambulatory Visit: Payer: Self-pay

## 2019-03-08 DIAGNOSIS — Z20822 Contact with and (suspected) exposure to covid-19: Secondary | ICD-10-CM

## 2019-03-08 MED ORDER — LANTUS SOLOSTAR 100 UNIT/ML ~~LOC~~ SOPN: PEN_INJECTOR | SUBCUTANEOUS | 99 refills | Status: DC

## 2019-03-08 NOTE — Telephone Encounter (Signed)
Please advise. Thank you

## 2019-03-08 NOTE — Telephone Encounter (Signed)
Sent in Lantus to [facility] in [place]. Contacted pt and pt verbalized understanding.

## 2019-03-08 NOTE — Telephone Encounter (Signed)
Ok let's same dose

## 2019-03-08 NOTE — Telephone Encounter (Signed)
Pt is needing her insulin changed to lantus due to insurance changing what they will cover. She didn't pick up the other insulin she is currently on due to it being over a $100 so she is needing lantus called in to [place] [place] [id number]

## 2019-03-14 LAB — NOVEL CORONAVIRUS, NAA: SARS-CoV-2, NAA: NOT DETECTED

## 2019-03-15 ENCOUNTER — Ambulatory Visit: Payer: 59 | Admitting: Family Medicine

## 2019-03-22 ENCOUNTER — Encounter: Payer: Self-pay | Admitting: Family Medicine

## 2019-03-22 ENCOUNTER — Other Ambulatory Visit: Payer: Self-pay

## 2019-03-22 ENCOUNTER — Ambulatory Visit: Payer: 59 | Admitting: Family Medicine

## 2019-03-22 VITALS — BP 122/82 | Temp 98.4°F | Ht 63.5 in | Wt 180.2 lb

## 2019-03-22 DIAGNOSIS — G5603 Carpal tunnel syndrome, bilateral upper limbs: Secondary | ICD-10-CM | POA: Diagnosis not present

## 2019-03-22 DIAGNOSIS — Z794 Long term (current) use of insulin: Secondary | ICD-10-CM

## 2019-03-22 DIAGNOSIS — E119 Type 2 diabetes mellitus without complications: Secondary | ICD-10-CM | POA: Diagnosis not present

## 2019-03-22 DIAGNOSIS — E7849 Other hyperlipidemia: Secondary | ICD-10-CM | POA: Diagnosis not present

## 2019-03-22 DIAGNOSIS — I10 Essential (primary) hypertension: Secondary | ICD-10-CM | POA: Diagnosis not present

## 2019-03-22 DIAGNOSIS — K219 Gastro-esophageal reflux disease without esophagitis: Secondary | ICD-10-CM

## 2019-03-22 LAB — POCT GLYCOSYLATED HEMOGLOBIN (HGB A1C): Hemoglobin A1C: 4.8 % (ref 4.0–5.6)

## 2019-03-22 MED ORDER — LANTUS SOLOSTAR 100 UNIT/ML ~~LOC~~ SOPN: PEN_INJECTOR | SUBCUTANEOUS | 5 refills | Status: DC

## 2019-03-22 MED ORDER — PANTOPRAZOLE SODIUM 40 MG PO TBEC: DELAYED_RELEASE_TABLET | ORAL | 1 refills | Status: DC

## 2019-03-22 MED ORDER — TRULICITY 1.5 MG/0.5ML ~~LOC~~ SOAJ: SUBCUTANEOUS | 5 refills | Status: DC

## 2019-03-22 MED ORDER — LISINOPRIL 10 MG PO TABS: 10.0000 mg | ORAL_TABLET | Freq: Every day | ORAL | 1 refills | Status: DC

## 2019-03-22 MED ORDER — PRAVASTATIN SODIUM 20 MG PO TABS: ORAL_TABLET | ORAL | 1 refills | Status: DC

## 2019-03-22 MED ORDER — CITALOPRAM HYDROBROMIDE 20 MG PO TABS: ORAL_TABLET | ORAL | 0 refills | Status: DC

## 2019-03-22 NOTE — Progress Notes (Signed)
Subjective:    Patient ID: Emily Braun, female    DOB: Apr 19, 1976, 43 y.o.   MRN: [id number]  Diabetes She presents for her follow-up diabetic visit. She has type 2 diabetes mellitus. Pertinent negatives for hypoglycemia include no confusion or dizziness. Pertinent negatives for diabetes include no chest pain, no fatigue, no polydipsia, no polyphagia and no weakness. Risk factors for coronary artery disease include hypertension and dyslipidemia. Current diabetic treatment includes insulin injections. She is compliant with treatment all of the time. Her weight is stable. She is following a diabetic diet.   Results for orders placed or performed in visit on 03/22/19  POCT glycosylated hemoglobin (Hb A1C)  Result Value Ref Range   Hemoglobin A1C 4.8 4.0 - 5.6 %   HbA1c POC (<> result, manual entry)     HbA1c, POC (prediabetic range)     HbA1c, POC (controlled diabetic range)     Patient here for follow-up regarding cholesterol.  The patient does have hyperlipidemia.  Patient does try to maintain a reasonable diet.  Patient does take the medication on a regular basis.  Denies missing a dose.  The patient denies any obvious side effects.  Prior blood work results reviewed with the patient.  The patient is aware of his cholesterol goals and the need to keep it under good control to lessen the risk of disease.  Patient does have ongoing trouble with reflux.  Takes medication on a regular basis.  Tries to minimize foods as best they can.  They understand the importance of dietary compliance.  May also try to avoid eating a large meal close to bedtime.  Patient denies any dysphagia denies hematochezia.  States medicine does a good job keeping the problem under good control.  Without the medication may certainly have issues.They desire to continue taking their medication.  Patient also has carpal tunnel in both hands she wears braces at nighttime it does help but she relates driving she gets symptoms  in addition to this she also relates the tip of her right thumb is been having a lot of increased numbness she denies any clumsiness or loss of dexterity.  There is no pain associated with this.  Patient deals with the stress of parenting it worked very well.  Tolerates citalopram and wants to continue it currently.  Patient is not having any low sugar spells but we did discuss the importance of trying to keep sugars under good control but potentially be able to reduce her insulin because her A1c is on the low end she will send [place] regular updates  Patient for blood pressure check up.  The patient does have hypertension.  The patient is on medication.  Patient relates compliance with meds. Todays BP reviewed with the patient. Patient denies issues with medication. Patient relates reasonable diet. Patient tries to minimize salt. Patient aware of BP goals.  Review of Systems  Constitutional: Negative for activity change, appetite change and fatigue.  HENT: Negative for congestion and rhinorrhea.   Respiratory: Negative for cough and shortness of breath.   Cardiovascular: Negative for chest pain and leg swelling.  Gastrointestinal: Negative for abdominal pain and diarrhea.  Endocrine: Negative for polydipsia and polyphagia.  Skin: Negative for color change.  Neurological: Negative for dizziness and weakness.  Psychiatric/Behavioral: Negative for behavioral problems and confusion.       Objective:   Physical Exam Vitals signs reviewed.  Constitutional:      General: She is not in acute distress. HENT:  Head: Normocephalic and atraumatic.  Eyes:     General:        Right eye: No discharge.        Left eye: No discharge.  Neck:     Trachea: No tracheal deviation.  Cardiovascular:     Rate and Rhythm: Normal rate and regular rhythm.     Heart sounds: Normal heart sounds. No murmur.  Pulmonary:     Effort: Pulmonary effort is normal. No respiratory distress.     Breath sounds: Normal  breath sounds.  Lymphadenopathy:     Cervical: No cervical adenopathy.  Skin:    General: Skin is warm and dry.  Neurological:     Mental Status: She is alert.     Coordination: Coordination normal.  Psychiatric:        Behavior: Behavior normal.           Assessment & Plan:  Diabetes very good control reduce insulin down to 25 sending some readings in a couple weeks time the goal would be to gradually keep reducing insulin  Dietary measures discussed patient states she will work harder on this she was following a low-carb diet she will start back in working harder at this  Carpal tunnel both hands worse on the right than the left she needs neurology evaluation and nerve conduction studies  Patient very busy between work and parenting doing a good job with that citalopram is helping her she would like to continue  Hyperlipidemia previous labs reviewed takes her medicine watches diet continue this  Reflux good control with medication continue this  25 minutes was spent with the patient.  This statement verifies that 25 minutes was indeed spent with the patient.  More than 50% of this visit-total duration of the visit-was spent in counseling and coordination of care. The issues that the patient came in for today as reflected in the diagnosis (s) please refer to documentation for further details.   Blood pressure good control continue current measures follow-up if problems

## 2019-03-28 ENCOUNTER — Ambulatory Visit: Payer: 59 | Admitting: Neurology

## 2019-03-28 ENCOUNTER — Other Ambulatory Visit: Payer: Self-pay

## 2019-03-28 ENCOUNTER — Encounter: Payer: Self-pay | Admitting: Neurology

## 2019-03-28 VITALS — BP 100/65 | HR 61 | Temp 98.4°F | Ht 62.0 in | Wt 182.5 lb

## 2019-03-28 DIAGNOSIS — G5603 Carpal tunnel syndrome, bilateral upper limbs: Secondary | ICD-10-CM

## 2019-03-28 DIAGNOSIS — M79642 Pain in left hand: Secondary | ICD-10-CM | POA: Diagnosis not present

## 2019-03-28 DIAGNOSIS — M79641 Pain in right hand: Secondary | ICD-10-CM | POA: Diagnosis not present

## 2019-03-28 DIAGNOSIS — R200 Anesthesia of skin: Secondary | ICD-10-CM | POA: Insufficient documentation

## 2019-03-28 NOTE — Progress Notes (Signed)
[facility]  PATIENT: KRISS PERLEBERG DOB: Jan 02, 1976  REFERRING DOCTOR OR PCP: Sallee Lange SOURCE: Patient, notes from primary care, imaging reports.  _________________________________   HISTORICAL  CHIEF COMPLAINT:  Chief Complaint  Patient presents with   New Patient (Initial Visit)    RM 13, alone. Internal referral for CTS-bilateral. Within the last two months she has had numbness in tip of right thumb. PCP recommended she get evaluated before sx worsen.    HISTORY OF PRESENT ILLNESS:  I had the pleasure seeing your patient, Jerome Otter, at [facility] for neurologic consultation regarding her hand pain and thumb numbness.  She is a 43 year old woman who has had hand pain over the last 2 yearsa nd thumb numbness over the last 2 months.    The numbness is in the tip of thumb and not other fingers.    Pain and numbness are worse if she uses the hands (gripping steering wheel, writing).  Symptoms do not occur at night.    She has only tried Tylenol and it has not been of benefit.   She has not noted weakness in her hands.    She denies any symptoms more proximally in the arms or in her legs. Gait and balance are fine.     She has some neck pain on occasion.  She has urinary urgency and incontinence and is seeing urology.  She has not had children.   She tried PT and started oxybutynin with benefit.  She denies any history of rheumatoid arthritis or other issues that may involve the hand/wrist.     X-ray report from 10/18/2017 of the cervical spine showed slight disc space narrowing at C6-C7 and rudimentary cervical ribs bilaterally.  REVIEW OF SYSTEMS: Constitutional: No fevers, chills, sweats, or change in appetite Eyes: No visual changes, double vision, eye pain Ear, nose and throat: No hearing loss, ear pain, nasal congestion, sore throat Cardiovascular: No chest pain, palpitations Respiratory: No shortness of breath at rest or  with exertion.   No wheezes GastrointestinaI: No nausea, vomiting, diarrhea.   she has GERD.   Genitourinary: No dysuria.  She has urinary frequency. Musculoskeletal: No neck pain, back pain Integumentary: No rash, pruritus, skin lesions Neurological: as above Psychiatric: No depression at this time.  No anxiety Endocrine: No palpitations, diaphoresis, change in appetite, change in weigh or increased thirst Hematologic/Lymphatic: No anemia, purpura, petechiae. Allergic/Immunologic: No itchy/runny eyes, nasal congestion, recent allergic reactions, rashes  ALLERGIES: Allergies  Allergen Reactions   Bydureon [Exenatide]     Caused knots in her skin as well as itching at the site of injection   Metformin And Related Diarrhea   Tradjenta [Linagliptin]     Patient relates abdominal cramps and diarrhea    HOME MEDICATIONS:  Current Outpatient Medications:    Calcium Carbonate-Vitamin D (CALCIUM + D PO), Take 600 mg by mouth daily., Disp: , Rfl:    citalopram (CELEXA) 20 MG tablet, TAKE 1/2 TO 1 (ONE-HALF TO ONE) TABLET BY MOUTH ONCE DAILY (Patient taking differently: Take 20 mg by mouth daily. 1  TABLET BY MOUTH ONCE DAILY), Disp: 90 tablet, Rfl: 0   Dulaglutide (TRULICITY) 1.5 HM/0.9OB SOPN, INJECT 1 SYRINGE SUBCUTANEOUSLY ONCE A WEEK, Disp: 4 pen, Rfl: 5   fluticasone (FLONASE) 50 MCG/ACT nasal spray, USE 2 SPRAY(S) IN EACH NOSTRIL ONCE DAILY, Disp: 16 g, Rfl: 5   Insulin Glargine (LANTUS SOLOSTAR) 100 UNIT/ML Solostar Pen, Inject 25 units nightly; may titrate up to  55 units max, Disp: 5 pen, Rfl: 5   lisinopril (ZESTRIL) 10 MG tablet, Take 1 tablet (10 mg total) by mouth daily., Disp: 90 tablet, Rfl: 1   loratadine (CLARITIN) 10 MG tablet, Take 10 mg by mouth daily., Disp: , Rfl:    pantoprazole (PROTONIX) 40 MG tablet, TAKE 1 TABLET BY MOUTH ONCE DAILY FOR  ACID  REFLUX, Disp: 90 tablet, Rfl: 1   pravastatin (PRAVACHOL) 20 MG tablet, One tablet on Monday and Friday, Disp:  24 tablet, Rfl: 1  Current Facility-Administered Medications:    0.9 %  sodium chloride infusion, 500 mL, Intravenous, Continuous, Danis, Kirke Corin, MD  PAST MEDICAL HISTORY: Past Medical History:  Diagnosis Date   Anxiety    Depression    GERD (gastroesophageal reflux disease)    Hypertension    under control with med., has been on med. x 1 yr.   Left maxillary sinusitis 02/2014   Seasonal allergies     PAST SURGICAL HISTORY: Past Surgical History:  Procedure Laterality Date   CHOLECYSTECTOMY  09/10/2007   COLONOSCOPY WITH PROPOFOL  02/22/2013   MAXILLARY ANTROSTOMY Left 03/10/2014   Procedure: LEFT MAXILLARY ANTROSTOMY;  Surgeon: Ascencion Dike, MD;  Location: [place];  Service: ENT;  Laterality: Left;   ORIF PATELLA FRACTURE Left    WISDOM TOOTH EXTRACTION      FAMILY HISTORY: Family History  Problem Relation Age of Onset   Diabetes Father    Hypertension Father    Diabetes Maternal Grandmother    Diabetes Paternal Grandmother    Hypertension Mother    Cancer - Other Brother        testicular   Esophageal cancer Neg Hx     SOCIAL HISTORY:  Social History   Socioeconomic History   Marital status: Single    Spouse name: Not on file   Number of children: 1   Years of education: Not on file   Highest education level: Not on file  Occupational History   Occupation: patient Warehouse manager    Comment: [employer] [place] resource strain: Not on file   Food insecurity    Worry: Not on file    Inability: Not on file   Transportation needs    Medical: Not on file    Non-medical: Not on file  Tobacco Use   Smoking status: Never Smoker   Smokeless tobacco: Never Used  Substance and Sexual Activity   Alcohol use: Yes    Comment: socially   Drug use: No   Sexual activity: Not on file  Lifestyle   Physical activity    Days per week: Not on file    Minutes per session: Not on file    Stress: Not on file  Relationships   Social connections    Talks on phone: Not on file    Gets together: Not on file    Attends religious service: Not on file    Active member of club or organization: Not on file    Attends meetings of clubs or organizations: Not on file    Relationship status: Not on file   Intimate partner violence    Fear of current or ex partner: Not on file    Emotionally abused: Not on file    Physically abused: Not on file    Forced sexual activity: Not on file  Other Topics Concern   Not on file  Social History Narrative   Lives with  foster-son   Caffeine use: sometimes   Right handed      PHYSICAL EXAM  Vitals:   03/28/19 1028  BP: 100/65  Pulse: 61  Temp: 98.4 F (36.9 C)  SpO2: 98%  Weight: 182 lb 8 oz (82.8 kg)  Height: 5\' 2"  (1.575 m)    Body mass index is 33.38 kg/m.   General: The patient is well-developed and well-nourished and in no acute distress  HEENT:  Head is [place]/AT.  Sclera are anicteric.     Neck:  The neck is nontender with good range of motion   Skin: Extremities are without rash or  edema.  Musculoskeletal: She has mild tenderness at the [facility] joint at the base of the right thumb.  Joints appear normal and there was normal range of motion in the hands  Neurologic Exam  Mental status: The patient is alert and oriented x 3 at the time of the examination. The patient has apparent normal recent and remote memory, with an apparently normal attention span and concentration ability.   Speech is normal.  Cranial nerves: Extraocular movements are full.  .Facial strength is normal.   No obvious hearing deficits are noted.  Motor:  Muscle bulk is normal.   Tone is normal. Strength is  5 / 5 in all 4 extremities.   Sensory: Sensory testing is intact to pinprick, soft touch and vibration sensation in the arms except for the inner palmar aspect of the right thumb which had reduced sensation  Coordination: Cerebellar testing  reveals good finger-nose-finger  Gait and station: Station is normal.   Gait is normal.   Reflexes: Deep tendon reflexes are symmetric and normal in the arms  Other: Tinel's signs were negative at the wrist and elbow and Phalen's sign was negative in the hands      ASSESSMENT AND PLAN  1. Numbness of right hand   2. Bilateral hand pain   3. Bilateral carpal tunnel syndrome    In summary, Ms. Doxtater is a 43 year old woman with a 2-year history of bilateral hand pain with activities and a 10-month history of right thumb numbness.  The numbness might be in the distribution of one of the digital nerves as it is limited to just part of the palmar aspect of the thumb.  Usually, these will improve on their own.  If pain worsens we could always consider a medication like gabapentin or a tricyclic.   As she does get pain in the hand and wrist with certain activities, we need to check a nerve conduction study/EMG to determine if there is carpal tunnel syndrome or radiculopathy or other issue that might explain the symptoms.  We will get this arranged and I will see her for the EMG portion of the test.   Due to GERD she would prefer not to take an anti-inflammatory.  Consider meloxicam or Celebrex if symptoms worsen.  She should continue to wear the wrist splint at night.  She will return to see me for the EMG but call sooner if new or worsening neurologic symptoms.  Thank you for asking me to see Ms. Sharol Roussel.  Please let me know if I can be of further assistance with her or other patients in the future.   Ciin Brazzel A. Felecia Shelling, MD, PhD, Charlynn Grimes 8/34/1962, 22:97 AM Certified in Neurology, Clinical Neurophysiology, Sleep Medicine and Neuroimaging  [facility] [address], [place], [place] [address] [phone]

## 2019-04-15 ENCOUNTER — Other Ambulatory Visit: Payer: Self-pay

## 2019-04-15 ENCOUNTER — Ambulatory Visit (INDEPENDENT_AMBULATORY_CARE_PROVIDER_SITE_OTHER): Payer: 59 | Admitting: Family Medicine

## 2019-04-15 DIAGNOSIS — R05 Cough: Secondary | ICD-10-CM | POA: Diagnosis not present

## 2019-04-15 DIAGNOSIS — J301 Allergic rhinitis due to pollen: Secondary | ICD-10-CM | POA: Diagnosis not present

## 2019-04-15 DIAGNOSIS — Z20822 Contact with and (suspected) exposure to covid-19: Secondary | ICD-10-CM

## 2019-04-15 DIAGNOSIS — R059 Cough, unspecified: Secondary | ICD-10-CM

## 2019-04-15 NOTE — Progress Notes (Signed)
   Subjective:    Patient ID: Emily Braun, female    DOB: Jul 17, 1976, 43 y.o.   MRN: [id number]  Cough This is a new problem. Episode onset: 5 days. Pertinent negatives include no chest pain, ear pain, fever, rhinorrhea, shortness of breath or wheezing. Associated symptoms comments: congestion. Treatments tried: flonase, loratadine.    She states she started with a cough last week and then she went to the beach the cough persisted well she was at the beach she denied any fevers or chills she had 1 day of sore throat but she thought that was due to the air conditioner she denied body aches diarrhea nausea she states she is feeling fine currently with an occasional cough no wheezing or difficulty breathing  PMH benign/diabetes Virtual Visit via Video Note  I connected with Emily Braun on 04/15/19 at 10:00 AM EDT by a video enabled telemedicine application and verified that I am speaking with the correct person using two identifiers.  Location: Patient: home Provider: office   I discussed the limitations of evaluation and management by telemedicine and the availability of in person appointments. The patient expressed understanding and agreed to proceed.  History of Present Illness:    Observations/Objective:   Assessment and Plan:   Follow Up Instructions:    I discussed the assessment and treatment plan with the patient. The patient was provided an opportunity to ask questions and all were answered. The patient agreed with the plan and demonstrated an understanding of the instructions.   The patient was advised to call back or seek an in-person evaluation if the symptoms worsen or if the condition fails to improve as anticipated.  I provided 15 minutes of non-face-to-face time during this encounter.   Dayton Bailiff, LPN     Review of Systems  Constitutional: Negative for activity change and fever.  HENT: Negative for congestion, ear pain and rhinorrhea.    Eyes: Negative for discharge.  Respiratory: Positive for cough. Negative for shortness of breath and wheezing.   Cardiovascular: Negative for chest pain.       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

## 2019-04-17 ENCOUNTER — Telehealth: Payer: Self-pay | Admitting: Family Medicine

## 2019-04-17 LAB — NOVEL CORONAVIRUS, NAA: SARS-CoV-2, NAA: NOT DETECTED

## 2019-04-17 NOTE — Telephone Encounter (Signed)
Pt states no fever or body aches but still having sinus symptoms. Drainage in throat, ear pain and yesterday had a small headache. She took tylenol and it went away.

## 2019-04-17 NOTE — Telephone Encounter (Signed)
Nurses-please talk with patient Verify that she is feeling fine having no significant symptoms such as fever body aches headaches etc. If so she may have a work note for the dates written as well as within the work note stating that she was tested for COVID it was negative and it was felt safe for her to return to work

## 2019-04-17 NOTE — Telephone Encounter (Signed)
Patient needing work note she received her covid-19 test was negative and she has no symptoms. She out 8/17-8/19. Please advise

## 2019-04-18 ENCOUNTER — Encounter: Payer: Self-pay | Admitting: Family Medicine

## 2019-04-18 ENCOUNTER — Other Ambulatory Visit: Payer: Self-pay | Admitting: Family Medicine

## 2019-04-18 MED ORDER — AMOXICILLIN 500 MG PO TABS: 500.0000 mg | ORAL_TABLET | Freq: Three times a day (TID) | ORAL | 0 refills | Status: DC

## 2019-04-18 NOTE — Telephone Encounter (Signed)
Pt sent in a mychart message to check on this message

## 2019-04-18 NOTE — Telephone Encounter (Signed)
I did talk with the patient she still having some congestion drainage no fevers chills or body ache She may return to work on August 24 Amoxicillin 3 times daily for the next 10 days Call us if any ongoing troubles

## 2019-04-18 NOTE — Telephone Encounter (Signed)
I did do a work note for the patient She may return to work on the 24th She is aware of this Please send her a copy of her letter via my chart

## 2019-04-19 ENCOUNTER — Other Ambulatory Visit: Payer: Self-pay | Admitting: Family Medicine

## 2019-04-19 NOTE — Telephone Encounter (Signed)
Work note went to my chart .

## 2019-05-20 ENCOUNTER — Other Ambulatory Visit: Payer: Self-pay | Admitting: Family Medicine

## 2019-06-06 ENCOUNTER — Ambulatory Visit (INDEPENDENT_AMBULATORY_CARE_PROVIDER_SITE_OTHER): Payer: 59 | Admitting: Otolaryngology

## 2019-06-11 ENCOUNTER — Ambulatory Visit: Payer: 59 | Admitting: Neurology

## 2019-06-11 ENCOUNTER — Other Ambulatory Visit: Payer: Self-pay

## 2019-06-11 ENCOUNTER — Ambulatory Visit (INDEPENDENT_AMBULATORY_CARE_PROVIDER_SITE_OTHER): Payer: 59 | Admitting: Neurology

## 2019-06-11 DIAGNOSIS — M79641 Pain in right hand: Secondary | ICD-10-CM

## 2019-06-11 DIAGNOSIS — M79642 Pain in left hand: Secondary | ICD-10-CM

## 2019-06-11 DIAGNOSIS — R200 Anesthesia of skin: Secondary | ICD-10-CM

## 2019-06-11 DIAGNOSIS — G5603 Carpal tunnel syndrome, bilateral upper limbs: Secondary | ICD-10-CM | POA: Diagnosis not present

## 2019-06-11 NOTE — Progress Notes (Signed)
[facility]  PATIENT: Emily Braun DOB: [birth year]  REFERRING DOCTOR OR PCP: Sallee Lange SOURCE: Patient, notes from primary care, imaging reports.  _________________________________   HISTORICAL  CHIEF COMPLAINT:  Chief Complaint  Patient presents with  . Numbness    HISTORY OF PRESENT ILLNESS:  Emily Braun is a 43 year old woman numbness in the hands.  The numbness is in the tip of thumb and not other fingers.    Pain and numbness are worse if she uses the hands (gripping steering wheel, writing).  Symptoms only rarely occur at night..  The right is worse than the left.  She has not noted weakness in her hands.    She denies any symptoms more proximally in the arms or in her legs.  Gait and balance are fine.   She has some neck pain on occasion.  She has urinary urgency and sees urology.  She tried PT and started oxybutynin with benefit.  She denies any history of rheumatoid arthritis or other issues that may involve the hand/wrist.     X-ray report from 10/18/2017 of the cervical spine showed slight disc space narrowing at C6-C7 and rudimentary cervical ribs bilaterally.  She has diabetes mellitus.  REVIEW OF SYSTEMS: Constitutional: No fevers, chills, sweats, or change in appetite Eyes: No visual changes, double vision, eye pain Ear, nose and throat: No hearing loss, ear pain, nasal congestion, sore throat Cardiovascular: No chest pain, palpitations Respiratory: No shortness of breath at rest or with exertion.   No wheezes GastrointestinaI: No nausea, vomiting, diarrhea.   she has GERD.   Genitourinary: No dysuria.  She has urinary frequency. Musculoskeletal: No neck pain, back pain Integumentary: No rash, pruritus, skin lesions Neurological: as above Psychiatric: No depression at this time.  No anxiety Endocrine: No palpitations, diaphoresis, change in appetite, change in weigh or increased thirst Hematologic/Lymphatic: No anemia,  purpura, petechiae. Allergic/Immunologic: No itchy/runny eyes, nasal congestion, recent allergic reactions, rashes  ALLERGIES: Allergies  Allergen Reactions  . Bydureon [Exenatide]     Caused knots in her skin as well as itching at the site of injection  . Metformin And Related Diarrhea  . Tradjenta [Linagliptin]     Patient relates abdominal cramps and diarrhea    HOME MEDICATIONS:  Current Outpatient Medications:  .  amoxicillin (AMOXIL) 500 MG tablet, Take 1 tablet (500 mg total) by mouth 3 (three) times daily., Disp: 30 tablet, Rfl: 0 .  Calcium Carbonate-Vitamin D (CALCIUM + D PO), Take 600 mg by mouth daily., Disp: , Rfl:  .  citalopram (CELEXA) 20 MG tablet, Take 1 tablet (20 mg total) by mouth daily. 1  TABLET BY MOUTH ONCE DAILY, Disp: 90 tablet, Rfl: 0 .  Dulaglutide (TRULICITY) 1.5 0000000 SOPN, INJECT 1 SYRINGE SUBCUTANEOUSLY ONCE A WEEK, Disp: 4 pen, Rfl: 5 .  fluticasone (FLONASE) 50 MCG/ACT nasal spray, Use 2 spray(s) in each nostril once daily, Disp: 16 g, Rfl: 0 .  Insulin Glargine (LANTUS SOLOSTAR) 100 UNIT/ML Solostar Pen, Inject 25 units nightly; may titrate up to 55 units max, Disp: 5 pen, Rfl: 5 .  lisinopril (ZESTRIL) 10 MG tablet, Take 1 tablet (10 mg total) by mouth daily., Disp: 90 tablet, Rfl: 1 .  loratadine (CLARITIN) 10 MG tablet, Take 10 mg by mouth daily., Disp: , Rfl:  .  pantoprazole (PROTONIX) 40 MG tablet, TAKE 1 TABLET BY MOUTH ONCE DAILY FOR  ACID  REFLUX, Disp: 90 tablet, Rfl: 1 .  pravastatin (PRAVACHOL) 20  MG tablet, One tablet on Monday and Friday, Disp: 24 tablet, Rfl: 1  Current Facility-Administered Medications:  .  0.9 %  sodium chloride infusion, 500 mL, Intravenous, Continuous, Danis, Kirke Corin, MD  PAST MEDICAL HISTORY: Past Medical History:  Diagnosis Date  . Anxiety   . Depression   . GERD (gastroesophageal reflux disease)   . Hypertension    under control with med., has been on med. x 1 yr.  . Left maxillary sinusitis 02/2014   . Seasonal allergies     PAST SURGICAL HISTORY: Past Surgical History:  Procedure Laterality Date  . CHOLECYSTECTOMY  09/10/2007  . COLONOSCOPY WITH PROPOFOL  02/22/2013  . MAXILLARY ANTROSTOMY Left 03/10/2014   Procedure: LEFT MAXILLARY ANTROSTOMY;  Surgeon: Ascencion Dike, MD;  Location: [place];  Service: ENT;  Laterality: Left;  . ORIF PATELLA FRACTURE Left   . WISDOM TOOTH EXTRACTION      FAMILY HISTORY: Family History  Problem Relation Age of Onset  . Diabetes Father   . Hypertension Father   . Diabetes Maternal Grandmother   . Diabetes Paternal Grandmother   . Hypertension Mother   . Cancer - Other Brother        testicular  . Esophageal cancer Neg Hx     SOCIAL HISTORY:  Social History   Socioeconomic History  . Marital status: Single    Spouse name: Not on file  . Number of children: 1  . Years of education: Not on file  . Highest education level: Not on file  Occupational History  . Occupation: IT trainer    Comment: [employer]  . Financial resource strain: Not on file  . Food insecurity    Worry: Not on file    Inability: Not on file  . Transportation needs    Medical: Not on file    Non-medical: Not on file  Tobacco Use  . Smoking status: Never Smoker  . Smokeless tobacco: Never Used  Substance and Sexual Activity  . Alcohol use: Yes    Comment: socially  . Drug use: No  . Sexual activity: Not on file  Lifestyle  . Physical activity    Days per week: Not on file    Minutes per session: Not on file  . Stress: Not on file  Relationships  . Social Herbalist on phone: Not on file    Gets together: Not on file    Attends religious service: Not on file    Active member of club or organization: Not on file    Attends meetings of clubs or organizations: Not on file    Relationship status: Not on file  . Intimate partner violence    Fear of current or ex partner: Not on file     Emotionally abused: Not on file    Physically abused: Not on file    Forced sexual activity: Not on file  Other Topics Concern  . Not on file  Social History Narrative   Lives with foster-son   Caffeine use: sometimes   Right handed      PHYSICAL EXAM  There were no vitals filed for this visit.  There is no height or weight on file to calculate BMI.   General: The patient is well-developed and well-nourished and in no acute distress   Neck:  The neck is nontender with good range of motion   Neurologic Exam  Mental status: The patient is  alert and oriented x 3 at the time of the examination.    Speech is normal.  Cranial nerves: Extraocular movements are full.    Motor:  Muscle bulk is normal.   Tone is normal. Strength is  5 / 5 in all 4 extremities except 4/5 right abductor pollicis brevis.   Sensory: Sensory testing is intact to pinprick, soft touch and vibration sensation in the arms except for the inner palmar aspect of the right thumb which had reduced sensation  Gait and station: Station is normal.   Gait is normal.   Reflexes: Deep tendon reflexes are symmetric and normal in the arms  Other: Tinel's signs were present at both wrist and Phalen's sign was present on the right      ASSESSMENT AND PLAN  1. Numbness of right hand   2. Bilateral hand pain    1.   She had NCV/EMG today (see above). 2.   MRI of the cervical spine to further assess the left arm symptoms and cervical radiculopathy and determine if further intervention is necessary. 3.   If symptoms worsen, consider addition of gabapentin. 4.   We discussed referral to orthopedics for carpal tunnel surgery  5.   we will contact her after the MRI of the cervical spine.  Return as needed.  She should call if she has new or worsening neurologic symptoms.  Bijou Easler A. Felecia Shelling, MD, PhD, Charlynn Grimes 0000000, 0000000 PM Certified in Neurology, Clinical Neurophysiology, Sleep Medicine and Neuroimaging  [facility] [address], [place], [place] [address] [phone]

## 2019-06-11 NOTE — Progress Notes (Signed)
Full Name: Emily Braun Gender: Female MRN #: [id number] Date of Birth: 1976/01/31    Visit Date: 06/11/2019 09:10 Age: 43 Years 53 Months Old Examining Physician: Arlice Colt, MD  Referring Physician: Arlice Colt, MD    History: Emily Braun is a 43 year old woman with numbness and tingling in both hands, right greater than left.  She notes the numbness most when driving a car.  She reports some neck pain.  Examination, she has reduced sensation over the thenar eminence of the right hand.  APB strength is mildly reduced.  There are Tinel signs at the wrist and a Phalen sign on the right.  Nerve conduction studies: The right median motor response had a delayed distal latency but normal amplitude and conduction velocity in the forearm.  The left median motor response and bilateral ulnar motor responses were normal.  The ulnar F-wave responses were normal.  The left median sensory response was mildly delayed in peak latency with normal amplitude.  The right median motor response was moderately delayed in latency and reduced in amplitude.  Both ulnar sensory responses were normal.  Electromyography: Needle EMG of both arms was performed.  There was mild chronic denervation noted in the left deltoid, extensor digitorum communis and abductor pollicis brevis muscles.  Other muscles tested were normal.  None of the muscles tested had abnormal spontaneous activity.  Impression: This NCV/EMG study shows neurophysiologic evidence of: 1.   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 2.   Mild median neuropathy across the left wrist 3.   Probable mild left C8 and possible minimal left C5 or C6 chronic radiculopathies  Emily Braun A. Felecia Shelling, MD, PhD, FAAN Certified in Neurology, Clinical Neurophysiology, Sleep Medicine, Pain Medicine and Neuroimaging Director, [place] [facility] [address] [place], [place] [address] [phone]          [facility]    Nerve / Sites Muscle Latency Ref. Amplitude Ref. Rel Amp Segments Distance Velocity Ref. Area    ms ms mV mV %  cm m/s m/s mVms  L Median - APB     Wrist APB 4.0 ?4.4 6.4 ?4.0 100 Wrist - APB 7   19.2     Upper arm APB 7.6  5.5  86.3 Upper arm - Wrist 20 56 ?49 17.0  R Median - APB     Wrist APB 6.9 ?4.4 6.6 ?4.0 100 Wrist - APB 7   23.4     Upper arm APB 11.0  5.6  84.8 Upper arm - Wrist 20 49 ?49 19.5  L Ulnar - ADM     Wrist ADM 2.0 ?3.3 10.8 ?6.0 100 Wrist - ADM 7   30.3     B.Elbow ADM 4.7  10.7  99.8 B.Elbow - Wrist 16 59 ?49 29.2     A.Elbow ADM 6.7  10.5  97.9 A.Elbow - B.Elbow 10 51 ?49 28.0         A.Elbow - Wrist      R Ulnar - ADM     Wrist ADM 2.6 ?3.3 13.7 ?6.0 100 Wrist - ADM 7   31.0     B.Elbow ADM 5.0  12.8  93.5 B.Elbow - Wrist 16 67 ?49 31.8     A.Elbow ADM 6.9  12.4  97.1 A.Elbow - B.Elbow 10 53 ?49 30.8         A.Elbow - Wrist  Que[place]    Nerve / Sites Rec. Site Peak Lat Ref.  Amp Ref. Segments Distance    ms ms V V  cm  L Median - Orthodromic (Dig II, Mid palm)     Dig II Wrist 3.7 ?3.4 10 ?10 Dig II - Wrist 13  R Median - Orthodromic (Dig II, Mid palm)     Dig II Wrist 5.5 ?3.4 3 ?10 Dig II - Wrist 13  L Ulnar - Orthodromic, (Dig V, Mid palm)     Dig V Wrist 2.7 ?3.1 5 ?5 Dig V - Wrist 11  R Ulnar - Orthodromic, (Dig V, Mid palm)     Dig V Wrist 2.4 ?3.1 6 ?5 Dig V - Wrist 13              F  Wave    Nerve F Lat Ref.   ms ms  L Ulnar - ADM 24.9 ?32.0  R Ulnar - ADM 25.2 ?32.0         EMG full       EMG Summary Table    Spontaneous MUAP Recruitment  Muscle IA Fib PSW Fasc Other Amp Dur. Poly Pattern  R. Deltoid Normal None None None _______ Normal Normal Normal Normal  R. Triceps brachii Normal None None None _______ Normal Normal Normal Normal  R. Biceps brachii Normal None None None _______ Normal Normal Normal Normal  R. Extensor digitorum communis Normal None None  None _______ Normal Normal 1+ Normal  R. First dorsal interosseous Normal None None None _______ Normal Normal Normal Normal  R. Abductor pollicis brevis Normal None None None _______ Normal Normal 1+ Normal  L. Deltoid Normal None None None _______ Normal Normal 1+ Reduced  L. Triceps brachii Normal None None None _______ Normal Normal Normal Normal  L. Biceps brachii Normal None None None _______ Normal Normal 1+ Normal  L. Extensor digitorum communis Normal None None None _______ Increased Normal 1+ Reduced  L. Flexor carpi ulnaris Normal None None None _______ Normal Normal Normal Normal  L. First dorsal interosseous Normal None None None _______ Normal Normal Normal Normal  L. Abductor pollicis brevis Normal None None None _______ Normal Normal 1+ Reduced

## 2019-06-21 NOTE — Progress Notes (Signed)
Nurses Find out from the patient in regards to her nerve conduction study-it does show some mild impingement Is the neurologist working with this?  Did they recommend anything to the patient? We are trying to clarify if we need to be helping with any referrals etc.

## 2019-06-21 NOTE — Progress Notes (Signed)
Pt contacted and states that the neurologist is deciding on wether doing a MRI or not. Pt has not heard anything from neurologist at this point. Pt states she is going to call the neurologist in a week or so if she has not heard from them.

## 2019-07-04 ENCOUNTER — Ambulatory Visit (INDEPENDENT_AMBULATORY_CARE_PROVIDER_SITE_OTHER): Payer: 59 | Admitting: Otolaryngology

## 2019-07-04 DIAGNOSIS — J310 Chronic rhinitis: Secondary | ICD-10-CM | POA: Diagnosis not present

## 2019-09-03 ENCOUNTER — Telehealth: Payer: Self-pay | Admitting: Family Medicine

## 2019-09-03 NOTE — Telephone Encounter (Signed)
PA needed for Trulicity 1.5mg /0.5 ml pen injectors. PA attempted through Cover My Meds; await decision.

## 2019-09-04 ENCOUNTER — Telehealth: Payer: Self-pay | Admitting: Family Medicine

## 2019-09-04 DIAGNOSIS — E119 Type 2 diabetes mellitus without complications: Secondary | ICD-10-CM

## 2019-09-04 DIAGNOSIS — I10 Essential (primary) hypertension: Secondary | ICD-10-CM

## 2019-09-04 DIAGNOSIS — Z79899 Other long term (current) drug therapy: Secondary | ICD-10-CM

## 2019-09-04 DIAGNOSIS — Z794 Long term (current) use of insulin: Secondary | ICD-10-CM

## 2019-09-04 DIAGNOSIS — E7849 Other hyperlipidemia: Secondary | ICD-10-CM

## 2019-09-04 NOTE — Telephone Encounter (Signed)
PA has been approved for Trulicity Injections [id number] until 09/02/20 or until coverage for the med is no longer available under your benefit plan or the med becomes subject to a pharmacy benefit coverage requirement. Left message to return call

## 2019-09-04 NOTE — Telephone Encounter (Signed)
Pt has upcoming appt on 09/26/2019 and is wanting to know if she is needing any labs done. Last labs completed 09/12/2018 lipid, hepatic, bmet and a1c. Please advise. Thank you

## 2019-09-04 NOTE — Addendum Note (Signed)
Addended by: Vicente Males on: 09/04/2019 01:21 PM   Modules accepted: Orders

## 2019-09-04 NOTE — Telephone Encounter (Signed)
Pt returned call and verbalized understanding  

## 2019-09-04 NOTE — Telephone Encounter (Signed)
Lab work and urine [id number], metabolic 7, lipid, liver, urine ACR

## 2019-09-04 NOTE — Telephone Encounter (Signed)
Lab orders placed and pt is aware 

## 2019-09-21 LAB — HEPATIC FUNCTION PANEL
ALT: 21 IU/L (ref 0–32)
AST: 24 IU/L (ref 0–40)
Albumin: 4.4 g/dL (ref 3.8–4.8)
Alkaline Phosphatase: 55 IU/L (ref 39–117)
Bilirubin Total: 0.4 mg/dL (ref 0.0–1.2)
Bilirubin, Direct: 0.15 mg/dL (ref 0.00–0.40)
Total Protein: 6.6 g/dL (ref 6.0–8.5)

## 2019-09-21 LAB — LIPID PANEL
Chol/HDL Ratio: 1 ratio (ref 0.0–4.4)
Cholesterol, Total: 92 mg/dL — ABNORMAL LOW (ref 100–199)
HDL: 95 mg/dL (ref >39)
Triglycerides: 26 mg/dL (ref 0–149)

## 2019-09-21 LAB — MICROALBUMIN / CREATININE URINE RATIO
Creatinine, Urine: 176.4 mg/dL
Microalb/Creat Ratio: 8 mg/g creat (ref 0–29)
Microalbumin, Urine: 13.4 ug/mL

## 2019-09-21 LAB — BASIC METABOLIC PANEL
BUN/Creatinine Ratio: 24 — ABNORMAL HIGH (ref 9–23)
BUN: 15 mg/dL (ref 6–24)
CO2: 28 mmol/L (ref 20–29)
Calcium: 9.3 mg/dL (ref 8.7–10.2)
Chloride: 101 mmol/L (ref 96–106)
Creatinine, Ser: 0.62 mg/dL (ref 0.57–1.00)
GFR calc Af Amer: 129 mL/min/{1.73_m2} (ref >59)
GFR calc non Af Amer: 112 mL/min/{1.73_m2} (ref >59)
Glucose: 107 mg/dL — ABNORMAL HIGH (ref 65–99)
Potassium: 4 mmol/L (ref 3.5–5.2)
Sodium: 141 mmol/L (ref 134–144)

## 2019-09-21 LAB — HEMOGLOBIN A1C
Est. average glucose Bld gHb Est-mCnc: 105 mg/dL
Hgb A1c MFr Bld: 5.3 % (ref 4.8–5.6)

## 2019-09-24 LAB — SPECIMEN STATUS REPORT

## 2019-09-26 ENCOUNTER — Ambulatory Visit (INDEPENDENT_AMBULATORY_CARE_PROVIDER_SITE_OTHER): Payer: 59 | Admitting: Family Medicine

## 2019-09-26 DIAGNOSIS — Z794 Long term (current) use of insulin: Secondary | ICD-10-CM | POA: Diagnosis not present

## 2019-09-26 DIAGNOSIS — E7849 Other hyperlipidemia: Secondary | ICD-10-CM

## 2019-09-26 DIAGNOSIS — I10 Essential (primary) hypertension: Secondary | ICD-10-CM

## 2019-09-26 DIAGNOSIS — E119 Type 2 diabetes mellitus without complications: Secondary | ICD-10-CM

## 2019-09-26 NOTE — Progress Notes (Signed)
Subjective:    Patient ID: Emily Braun, female    DOB: 1975-08-30, 44 y.o.   MRN: [id number]  Diabetes She presents for her follow-up diabetic visit. She has type 2 diabetes mellitus. Pertinent negatives for hypoglycemia include no confusion or dizziness. Pertinent negatives for diabetes include no chest pain, no fatigue, no polydipsia, no polyphagia and no weakness. Current diabetic treatments: trulicity. She is compliant with treatment all of the time. Home blood sugar record trend: pt states sugar has been running good. Eye exam is current (yesterday).  Patient presents follow-up taking her medicines as directed trying to stay active states she could do a better job with exercise diet was done fairly well minimizing starches in the diet. Results for orders placed or performed in visit on 09/04/19  Hemoglobin A1c  Result Value Ref Range   Hgb A1c MFr Bld 5.3 4.8 - 5.6 %   Est. average glucose Bld gHb Est-mCnc 105 mg/dL  Basic Metabolic Panel (BMET)  Result Value Ref Range   Glucose 107 (H) 65 - 99 mg/dL   BUN 15 6 - 24 mg/dL   Creatinine, Ser 0.62 0.57 - 1.00 mg/dL   GFR calc non Af Amer 112 >59 mL/min/1.73   GFR calc Af Amer 129 >59 mL/min/1.73   BUN/Creatinine Ratio 24 (H) 9 - 23   Sodium 141 134 - 144 mmol/L   Potassium 4.0 3.5 - 5.2 mmol/L   Chloride 101 96 - 106 mmol/L   CO2 28 20 - 29 mmol/L   Calcium 9.3 8.7 - 10.2 mg/dL  Lipid Profile  Result Value Ref Range   Cholesterol, Total 92 (L) 100 - 199 mg/dL   Triglycerides 26 0 - 149 mg/dL   HDL 95 >39 mg/dL   VLDL Cholesterol Cal CANCELED mg/dL   LDL Chol Calc (NIH) Comment (A) 0 - 99 mg/dL   Chol/HDL Ratio 1.0 0.0 - 4.4 ratio  Hepatic function panel  Result Value Ref Range   Total Protein 6.6 6.0 - 8.5 g/dL   Albumin 4.4 3.8 - 4.8 g/dL   Bilirubin Total 0.4 0.0 - 1.2 mg/dL   Bilirubin, Direct 0.15 0.00 - 0.40 mg/dL   Alkaline Phosphatase 55 39 - 117 IU/L   AST 24 0 - 40 IU/L   ALT 21 0 - 32 IU/L  Urine  Microalbumin w/creat. ratio  Result Value Ref Range   Creatinine, Urine 176.4 Not Estab. mg/dL   Microalbumin, Urine 13.4 Not Estab. ug/mL   Microalb/Creat Ratio 8 0 - 29 mg/g creat  Specimen status report  Result Value Ref Range   specimen status report Comment   LDL cholesterol, direct  Result Value Ref Range   LDL Direct 28 0 - 99 mg/dL  Specimen status report  Result Value Ref Range   specimen status report Comment     Virtual Visit via Video Note  I connected with Emily Braun on 09/26/19 at  3:00 PM EST by a video enabled telemedicine application and verified that I am speaking with the correct person using two identifiers.  Location: Patient: home Provider: office   I discussed the limitations of evaluation and management by telemedicine and the availability of in person appointments. The patient expressed understanding and agreed to proceed.  History of Present Illness:    Observations/Objective:   Assessment and Plan:   Follow Up Instructions:    I discussed the assessment and treatment plan with the patient. The patient was provided an opportunity to ask questions and  all were answered. The patient agreed with the plan and demonstrated an understanding of the instructions.   The patient was advised to call back or seek an in-person evaluation if the symptoms worsen or if the condition fails to improve as anticipated.  I provided 17 minutes of non-face-to-face time during this encounter.       Review of Systems  Constitutional: Negative for activity change, appetite change and fatigue.  HENT: Negative for congestion and rhinorrhea.   Respiratory: Negative for cough and shortness of breath.   Cardiovascular: Negative for chest pain and leg swelling.  Gastrointestinal: Negative for abdominal pain and diarrhea.  Endocrine: Negative for polydipsia and polyphagia.  Skin: Negative for color change.  Neurological: Negative for dizziness and weakness.    Psychiatric/Behavioral: Negative for behavioral problems and confusion.       Objective:   Physical Exam   Patient had virtual visit Appears to be in no distress Atraumatic Neuro able to relate and oriented No apparent resp distress Color normal      Assessment & Plan:  Diabetes hyperlipidemia blood pressure all doing well continue current measures.  Follow-up if progressive troubles or worse otherwise recheck again in 6 months time.  Patient was encouraged to pick up her activity continue to do healthy diet.  Send [place] blood pressure readings in a few weeks time may be able to reduce the blood pressure medicine depending on the readings

## 2019-09-27 ENCOUNTER — Ambulatory Visit: Payer: 59 | Admitting: Family Medicine

## 2019-09-27 LAB — LDL CHOLESTEROL, DIRECT: LDL Direct: 28 mg/dL (ref 0–99)

## 2019-09-27 LAB — SPECIMEN STATUS REPORT

## 2019-09-27 MED ORDER — LISINOPRIL 10 MG PO TABS: 10.0000 mg | ORAL_TABLET | Freq: Every day | ORAL | 1 refills | Status: DC

## 2019-09-27 MED ORDER — TRULICITY 1.5 MG/0.5ML ~~LOC~~ SOAJ: SUBCUTANEOUS | 5 refills | Status: DC

## 2019-09-27 MED ORDER — PANTOPRAZOLE SODIUM 40 MG PO TBEC: DELAYED_RELEASE_TABLET | ORAL | 1 refills | Status: DC

## 2019-09-27 MED ORDER — LANTUS SOLOSTAR 100 UNIT/ML ~~LOC~~ SOPN: PEN_INJECTOR | SUBCUTANEOUS | 5 refills | Status: DC

## 2019-09-27 MED ORDER — PRAVASTATIN SODIUM 20 MG PO TABS: ORAL_TABLET | ORAL | 1 refills | Status: DC

## 2019-09-27 NOTE — Progress Notes (Signed)
Contacted pt. Pt states she is currently taking Celexa 20 mg daily. Pt aware 90 supply with one refill will be sent in. Please advise. Thank you

## 2019-09-30 ENCOUNTER — Telehealth: Payer: Self-pay | Admitting: Neurology

## 2019-09-30 ENCOUNTER — Other Ambulatory Visit: Payer: Self-pay | Admitting: Neurology

## 2019-09-30 ENCOUNTER — Telehealth: Payer: Self-pay | Admitting: *Deleted

## 2019-09-30 DIAGNOSIS — G5603 Carpal tunnel syndrome, bilateral upper limbs: Secondary | ICD-10-CM

## 2019-09-30 DIAGNOSIS — M5412 Radiculopathy, cervical region: Secondary | ICD-10-CM

## 2019-09-30 NOTE — Telephone Encounter (Deleted)
Error

## 2019-09-30 NOTE — Telephone Encounter (Signed)
Dr. Felecia Shelling- I reviewed notes. I see where you mentioned referring  to ortho for carpal tunnel surgery. Was there another referral besides that?

## 2019-09-30 NOTE — Telephone Encounter (Signed)
I will go ahead and place an order for referral for the carpal tunnel surgery.

## 2019-09-30 NOTE — Telephone Encounter (Signed)
I called pt and let her know referral placed to ortho for Carpal tunnel surgery. Pt asked about other referral for left arm nerve damage. I placed her on hold and spoke with Dr. Felecia Shelling. We will place order for MRI cervical. I relayed this to pt. She is not claustrophobic and does not have preference on imaging place. Advised her to call back if she not not hear about getting scheduled for either. She verbalized understanding.

## 2019-09-30 NOTE — Telephone Encounter (Signed)
Pt states she is ready to go ahead with the referrals that were mentioned to her at her appt. Pt states one was for her carpal tunnel and she could not remember what the other one was for. Please advise.

## 2019-10-02 ENCOUNTER — Other Ambulatory Visit: Payer: Self-pay | Admitting: Family Medicine

## 2019-10-09 ENCOUNTER — Telehealth: Payer: Self-pay | Admitting: Neurology

## 2019-10-09 NOTE — Telephone Encounter (Signed)
[employer] pending faxed notes 

## 2019-10-10 NOTE — Telephone Encounter (Signed)
I called [employer] to check the status they receive my fax and it is still pending.

## 2019-10-15 NOTE — Telephone Encounter (Signed)
[employer] did not approve the MRI. The reason is failure to improve with 6 week trial with clinical reevaluation. There is an option to do a peer to peer. The phone number is [phone] option 1. They are open from 7:00 AM to 7:00 PM [place] time. It does need to be scheduled. The case number is [id number]. The deadline is 11/04/19.

## 2019-10-15 NOTE — Telephone Encounter (Signed)
Please let her know that insurance would not cover the MRI until she is reevaluated.  We can try to get her into see me sometime in the next couple weeks.

## 2019-10-16 NOTE — Telephone Encounter (Signed)
Noted, I spoke to the patient informed her her insurance did not approve the MRI because they want a 6 week trial with a reevaluation. She is scheduled to see Dr. Felecia Shelling on 12/05/19.

## 2019-10-18 ENCOUNTER — Ambulatory Visit: Payer: 59 | Admitting: Orthopaedic Surgery

## 2019-10-18 ENCOUNTER — Other Ambulatory Visit: Payer: Self-pay

## 2019-10-18 ENCOUNTER — Encounter: Payer: Self-pay | Admitting: Orthopaedic Surgery

## 2019-10-18 VITALS — Ht 62.0 in | Wt 180.0 lb

## 2019-10-18 DIAGNOSIS — G5602 Carpal tunnel syndrome, left upper limb: Secondary | ICD-10-CM | POA: Insufficient documentation

## 2019-10-18 DIAGNOSIS — G5601 Carpal tunnel syndrome, right upper limb: Secondary | ICD-10-CM

## 2019-10-18 NOTE — Progress Notes (Signed)
Office Visit Note   Patient: Emily Braun           Date of Birth: Dec 31, 1975           MRN: [id number] Visit Date: 10/18/2019              Requested by: Emily Bottom, MD [address] [place],  [place] [address] PCP: Emily Drown, MD   Assessment & Plan: Visit Diagnoses:  1. Right carpal tunnel syndrome   2. Left carpal tunnel syndrome     Plan: Impression is moderate right carpal tunnel syndrome and mild left carpal tunnel syndrome.  I reviewed her nerve conduction studies in detail and based on our discussion and clinical findings patient has elected to proceed with a right carpal tunnel release and a left carpal tunnel injection in the operating room.  Risk benefits rehab and recovery were all reviewed with the patient in detail today.  Questions encouraged and answered.  Follow-Up Instructions: Return for 1 week postop visit.   Orders:  No orders of the defined types were placed in this encounter.  No orders of the defined types were placed in this encounter.     Procedures: No procedures performed   Clinical Data: No additional findings.   Subjective: Chief Complaint  Patient presents with  . Right Hand - Numbness  . Left Hand - Numbness    Emily Braun is a 44 year old female comes in for evaluation of worsening bilateral hand numbness and pain worse in the right hand.  She had EMGs done about 3 months ago which showed moderate right carpal tunnel syndrome and mild left carpal tunnel syndrome.  She has not had any injections she is still able to sleep without the symptoms awakening her.  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  She is a well-controlled diabetic.   Review of Systems  Constitutional: Negative.   HENT: Negative.   Eyes: Negative.   Respiratory: Negative.   Cardiovascular: Negative.   Endocrine: Negative.   Musculoskeletal: Negative.   Neurological: Negative.     Hematological: Negative.   Psychiatric/Behavioral: Negative.   All other systems reviewed and are negative.    Objective: Vital Signs: Ht 5\' 2"  (1.575 m)   Wt 180 lb (81.6 kg)   BMI 32.92 kg/m   Physical Exam Vitals and nursing note reviewed.  Constitutional:      Appearance: She is well-developed.  HENT:     Head: Normocephalic and atraumatic.  Pulmonary:     Effort: Pulmonary effort is normal.  Abdominal:     Palpations: Abdomen is soft.  Musculoskeletal:     Cervical back: Neck supple.  Skin:    General: Skin is warm.     Capillary Refill: Capillary refill takes less than 2 seconds.  Neurological:     Mental Status: She is alert and oriented to person, place, and time.  Psychiatric:        Behavior: Behavior normal.        Thought Content: Thought content normal.        Judgment: Judgment normal.     Ortho Exam Bilateral hand exam shows positive Durkin's and Phalen sign.  Negative Tinel.  No muscle atrophy. Specialty Comments:  No specialty comments available.  Imaging: No results found.   PMFS History: Patient Active Problem List   Diagnosis Date Noted  . Right carpal tunnel syndrome 10/18/2019  . Left  carpal tunnel syndrome 10/18/2019  . Numbness of right hand 03/28/2019  . Bilateral hand pain 03/28/2019  . Bilateral carpal tunnel syndrome 03/22/2019  . Hyperlipidemia 03/16/2018  . DM (diabetes mellitus), type 2 ([place]) 08/28/2015  . Prediabetes 10/06/2014  . GERD (gastroesophageal reflux disease) 08/25/2014  . Morbid obesity ([place]) 08/14/2013  . Essential hypertension, benign 07/03/2013  . Colitis 12/20/2012   Past Medical History:  Diagnosis Date  . Anxiety   . Depression   . GERD (gastroesophageal reflux disease)   . Hypertension    under control with med., has been on med. x 1 yr.  . Left maxillary sinusitis 02/2014  . Seasonal allergies     Family History  Problem Relation Age of Onset  . Diabetes Father   . Hypertension Father   .  Diabetes Maternal Grandmother   . Diabetes Paternal Grandmother   . Hypertension Mother   . Cancer - Other Brother        testicular  . Esophageal cancer Neg Hx     Past Surgical History:  Procedure Laterality Date  . CHOLECYSTECTOMY  09/10/2007  . COLONOSCOPY WITH PROPOFOL  02/22/2013  . MAXILLARY ANTROSTOMY Left 03/10/2014   Procedure: LEFT MAXILLARY ANTROSTOMY;  Surgeon: Ascencion Dike, MD;  Location: [facility];  Service: ENT;  Laterality: Left;  . ORIF PATELLA FRACTURE Left   . WISDOM TOOTH EXTRACTION     Social History   Occupational History  . Occupation: IT trainer    Comment: [employer]  Tobacco Use  . Smoking status: Never Smoker  . Smokeless tobacco: Never Used  Substance and Sexual Activity  . Alcohol use: Yes    Comment: socially  . Drug use: No  . Sexual activity: Not on file

## 2019-10-28 ENCOUNTER — Other Ambulatory Visit: Payer: Self-pay | Admitting: Family Medicine

## 2019-11-15 ENCOUNTER — Other Ambulatory Visit: Payer: Self-pay

## 2019-11-15 ENCOUNTER — Encounter: Payer: Self-pay | Admitting: Nurse Practitioner

## 2019-11-15 ENCOUNTER — Ambulatory Visit (INDEPENDENT_AMBULATORY_CARE_PROVIDER_SITE_OTHER): Payer: 59 | Admitting: Nurse Practitioner

## 2019-11-15 VITALS — BP 128/84 | Temp 97.9°F | Ht 62.0 in | Wt 185.2 lb

## 2019-11-15 DIAGNOSIS — Z01419 Encounter for gynecological examination (general) (routine) without abnormal findings: Secondary | ICD-10-CM | POA: Diagnosis not present

## 2019-11-15 DIAGNOSIS — L292 Pruritus vulvae: Secondary | ICD-10-CM | POA: Diagnosis not present

## 2019-11-15 DIAGNOSIS — K219 Gastro-esophageal reflux disease without esophagitis: Secondary | ICD-10-CM | POA: Diagnosis not present

## 2019-11-15 MED ORDER — FLUTICASONE PROPIONATE 50 MCG/ACT NA SUSP: NASAL | 2 refills | Status: DC

## 2019-11-15 NOTE — Progress Notes (Signed)
   Subjective:    Patient ID: Emily Braun, female    DOB: 12-28-1975, 44 y.o.   MRN: [id number]  HPI presents to the clinic for physical. Pertinent hx of T2DM, HTN, GERD, and anxiety.  Patient reports concern for vaginal itching only after urination. Patient denies vaginal d/c. No pain or burning. Expresses some concern regarding her recent weight gain related to increased carb intake and stress from COVID-19 pandemic. Patient reports her HTN, T2DM, and anxiety have been well controlled. Patient reports an increase in GERD symptoms and started back on Omeprazole 20mg  PRN as needed. Takes this occasionally in the morning and Pantoprazole at night. Last endoscopy was 2017 which was normal except for GERD.      Review of Systems  General: Denies feelings of isolation, reports she is adopting a 43 year old that she has been fostering, endorses recent weight gain of approximately 9 pounds related to COVID-19. Reports feeling well supported, denies smoking, denies alcohol consumption. Psych: Denies SI, denies anxiety attacks, reports well controlled anxiety HEENT: eye exam completed in February 2021 Cardiac: Denies chest pain Respiratory: denies ShOB, denies wheezing  GI: Denies blood in stool, denies abdominal pain, denies constipation. Endorses feeling food, especially bread, becoming "stuck" occasionally and relates it to her GERD.  Breast: Endorses doing self breast exams with no new lumps or nipple d/c GU: Endorses vaginal itching, reports better urinary control and decreased leaking, denies d/c, reports not currently sexually active. Denies irregular bleeding or vaginal pain. No pelvic pain.        Objective:   Physical Exam  HEENT: Thyroid palpated and WDL General: Well appearing 44 yo caucasian female. Good eye contact during exam, responds appropriately to questions.  Cardiac: S1, S2, no murmurs auscultated Respiratory: No audible wheezing, lung sounds clear in all fields  bilaterally GI: No noted tenderness with palpation of abdomen, No visualized abnormalities, Bowel sounds WDL Breast: No noted tenderness with palpation,no erythema, areas of discoloration or nipple inversion. No masses palpated at this time. Axillae: no lymphadenopathy.   GU: No noted lesions on labial folds, no noted redness or rash. No reported tenderness or pain during digital or speculum exam. Cervix visualized clearly with thin white discharge. No noted odor. No Cervical motion tenderness. Bimanual exam: no tenderness or obvious masses.    Assessment and Plan:     Well woman exam  Vulvar itching  Meds ordered this encounter  Medications  . fluticasone (FLONASE) 50 MCG/ACT nasal spray    Sig: Use 2 spray(s) in each nostril once daily    Dispense:  16 g    Refill:  2    Order Specific Question:   Supervising Provider    Answer:   Sallee Lange A [[id number]]     Plan: Goals is to decrease weight with diet changes and better control GERD. Continue on Trulicity at current dose as discussed. Counseled on importance of GERD control as patient has been taking Omperazole 20mg  PRN. Call back in 2-3 weeks if no improvement on current regimen.  Will look at options for vulvar itching without rash. Questions whether this may be medication related or possibly neuropathy.  Return in about 6 months (around 05/17/2020) for recheck.

## 2019-11-15 NOTE — Progress Notes (Signed)
   Subjective:    Patient ID: Emily Braun, female    DOB: 28-Jan-1976, 44 y.o.   MRN: [id number]  HPI The patient comes in today for a wellness visit.    A review of their health history was completed.  A review of medications was also completed.  Any needed refills; Flonase nasal spray   Eating habits: healthy   Falls/  MVA accidents in past few months: none  Regular exercise: none  Specialist pt sees on regular basis: neuro and ortho  Preventative health issues were discussed.   Additional concerns: none   Review of Systems     Objective:   Physical Exam        Assessment & Plan:

## 2019-11-15 NOTE — Progress Notes (Signed)
New note

## 2019-11-20 ENCOUNTER — Other Ambulatory Visit: Payer: Self-pay | Admitting: Physician Assistant

## 2019-11-20 MED ORDER — HYDROCODONE-ACETAMINOPHEN 5-325 MG PO TABS: 1.0000 | ORAL_TABLET | Freq: Three times a day (TID) | ORAL | 0 refills | Status: DC | PRN

## 2019-11-20 MED ORDER — ONDANSETRON HCL 4 MG PO TABS: 4.0000 mg | ORAL_TABLET | Freq: Three times a day (TID) | ORAL | 0 refills | Status: DC | PRN

## 2019-11-21 DIAGNOSIS — G5601 Carpal tunnel syndrome, right upper limb: Secondary | ICD-10-CM | POA: Diagnosis not present

## 2019-11-21 DIAGNOSIS — G5602 Carpal tunnel syndrome, left upper limb: Secondary | ICD-10-CM | POA: Diagnosis not present

## 2019-11-26 ENCOUNTER — Other Ambulatory Visit: Payer: Self-pay | Admitting: Family Medicine

## 2019-11-27 ENCOUNTER — Ambulatory Visit (INDEPENDENT_AMBULATORY_CARE_PROVIDER_SITE_OTHER): Payer: 59 | Admitting: Physician Assistant

## 2019-11-27 ENCOUNTER — Other Ambulatory Visit: Payer: Self-pay

## 2019-11-27 ENCOUNTER — Encounter: Payer: Self-pay | Admitting: Physician Assistant

## 2019-11-27 DIAGNOSIS — Z9889 Other specified postprocedural states: Secondary | ICD-10-CM

## 2019-11-27 DIAGNOSIS — G5601 Carpal tunnel syndrome, right upper limb: Secondary | ICD-10-CM

## 2019-11-27 NOTE — Progress Notes (Signed)
Post-Op Visit Note   Patient: Emily Braun           Date of Birth: 15-Jan-1976           MRN: [id number] Visit Date: 11/27/2019 PCP: Kathyrn Drown, MD   Assessment & Plan:  Chief Complaint:  Chief Complaint  Patient presents with  . Right Wrist - Follow-up, Routine Post Op   Visit Diagnoses:  1. S/P carpal tunnel release   2. Right carpal tunnel syndrome     Plan: Patient is a pleasant 44 year old right-hand-dominant female who comes in today 6 days out right carpal tunnel release 11/21/2019.  She has been doing well.  She does have some periincisional burning but nothing more.  No fevers or chills.  Examination of her right wrist reveals a well-healing surgical incision with nylon sutures in place.  No complication.  Fingers are warm and well-perfused.  Today, her wound was cleaned and covered with a Band-Aid.  Removable Velcro splint was applied.  She will follow-up with [place] in 1 week's time for likely suture removal.  It was reinforced that there should be no submerging the right hand in water or heavy lifting of the right upper extremity for a total of 4 weeks postop.  She understands and agrees.  Of note, the left carpal tunnel was injected at the time of surgical intervention on the right and she feels much better there.  Follow-Up Instructions: Return in about 1 week (around 12/04/2019) for suture removal.   Orders:  No orders of the defined types were placed in this encounter.  No orders of the defined types were placed in this encounter.   Imaging: No new imaging  PMFS History: Patient Active Problem List   Diagnosis Date Noted  . Vulvar itching 11/15/2019  . Right carpal tunnel syndrome 10/18/2019  . Left carpal tunnel syndrome 10/18/2019  . Numbness of right hand 03/28/2019  . Bilateral hand pain 03/28/2019  . Bilateral carpal tunnel syndrome 03/22/2019  . Hyperlipidemia 03/16/2018  . DM (diabetes mellitus), type 2 ([place]) 08/28/2015  . Prediabetes 10/06/2014    . GERD (gastroesophageal reflux disease) 08/25/2014  . Morbid obesity ([facility]) 08/14/2013  . Essential hypertension, benign 07/03/2013  . Colitis 12/20/2012   Past Medical History:  Diagnosis Date  . Anxiety   . Depression   . GERD (gastroesophageal reflux disease)   . Hypertension    under control with med., has been on med. x 1 yr.  . Left maxillary sinusitis 02/2014  . Seasonal allergies     Family History  Problem Relation Age of Onset  . Diabetes Father   . Hypertension Father   . Diabetes Maternal Grandmother   . Diabetes Paternal Grandmother   . Hypertension Mother   . Cancer - Other Brother        testicular  . Esophageal cancer Neg Hx     Past Surgical History:  Procedure Laterality Date  . CHOLECYSTECTOMY  09/10/2007  . COLONOSCOPY WITH PROPOFOL  02/22/2013  . MAXILLARY ANTROSTOMY Left 03/10/2014   Procedure: LEFT MAXILLARY ANTROSTOMY;  Surgeon: Ascencion Dike, MD;  Location: [facility];  Service: ENT;  Laterality: Left;  . ORIF PATELLA FRACTURE Left   . WISDOM TOOTH EXTRACTION     Social History   Occupational History  . 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 Smoking status: Never Smoker  . Smokeless tobacco: Never Used  Substance and Sexual  Activity  . Alcohol use: Yes    Comment: socially  . Drug use: No  . Sexual activity: Not on file

## 2019-12-04 ENCOUNTER — Inpatient Hospital Stay: Payer: 59 | Admitting: Orthopaedic Surgery

## 2019-12-05 ENCOUNTER — Other Ambulatory Visit: Payer: Self-pay

## 2019-12-05 ENCOUNTER — Ambulatory Visit: Payer: 59 | Admitting: Neurology

## 2019-12-05 ENCOUNTER — Ambulatory Visit (INDEPENDENT_AMBULATORY_CARE_PROVIDER_SITE_OTHER): Payer: 59 | Admitting: Physician Assistant

## 2019-12-05 ENCOUNTER — Encounter: Payer: Self-pay | Admitting: Orthopaedic Surgery

## 2019-12-05 DIAGNOSIS — G5601 Carpal tunnel syndrome, right upper limb: Secondary | ICD-10-CM

## 2019-12-05 DIAGNOSIS — Z9889 Other specified postprocedural states: Secondary | ICD-10-CM

## 2019-12-05 NOTE — Progress Notes (Signed)
   Post-Op Visit Note   Patient: Emily Braun           Date of Birth: [birth year]           MRN: [id number] Visit Date: 12/05/2019 PCP: Kathyrn Drown, MD   Assessment & Plan:  Chief Complaint:  Chief Complaint  Patient presents with  . Right Hand - Pain   Visit Diagnoses:  1. S/P carpal tunnel release   2. Carpal tunnel syndrome, right upper limb     Plan: Patient is a pleasant 44 year old female who presents our clinic today 2 weeks out right carpal tunnel release 11/21/2019.  She has been doing well.  She has some mild burning type pains around the incision but nothing more.  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  Examination of her right wrist reveals a well-healing surgical incision with nylon sutures in place.  No evidence of infection or cellulitis.  Fingers are warm and well-perfused.  At this point, nylon sutures were removed and Steri-Strips applied.  She may continue working but must wear her brace for another week.  No heavy lifting or submerging her hand in water for another 2 weeks.  Follow-up with [place] in 4 weeks time for recheck.  Follow-Up Instructions: Return in about 4 weeks (around 01/02/2020).   Orders:  No orders of the defined types were placed in this encounter.  No orders of the defined types were placed in this encounter.   Imaging: No new imaging  PMFS History: Patient Active Problem List   Diagnosis Date Noted  . Vulvar itching 11/15/2019  . Right carpal tunnel syndrome 10/18/2019  . Left carpal tunnel syndrome 10/18/2019  . Numbness of right hand 03/28/2019  . Bilateral hand pain 03/28/2019  . Bilateral carpal tunnel syndrome 03/22/2019  . Hyperlipidemia 03/16/2018  . DM (diabetes mellitus), type 2 ([place]) 08/28/2015  . Prediabetes 10/06/2014  . GERD (gastroesophageal reflux disease) 08/25/2014  . Morbid obesity ([place]) 08/14/2013  . Essential hypertension, benign 07/03/2013  . Colitis 12/20/2012   Past Medical History:   Diagnosis Date  . Anxiety   . Depression   . GERD (gastroesophageal reflux disease)   . Hypertension    under control with med., has been on med. x 1 yr.  . Left maxillary sinusitis 02/2014  . Seasonal allergies     Family History  Problem Relation Age of Onset  . Diabetes Father   . Hypertension Father   . Diabetes Maternal Grandmother   . Diabetes Paternal Grandmother   . Hypertension Mother   . Cancer - Other Brother        testicular  . Esophageal cancer Neg Hx     Past Surgical History:  Procedure Laterality Date  . CHOLECYSTECTOMY  09/10/2007  . COLONOSCOPY WITH PROPOFOL  02/22/2013  . MAXILLARY ANTROSTOMY Left 03/10/2014   Procedure: LEFT MAXILLARY ANTROSTOMY;  Surgeon: Ascencion Dike, MD;  Location: [facility];  Service: ENT;  Laterality: Left;  . ORIF PATELLA FRACTURE Left   . WISDOM TOOTH EXTRACTION     Social History   Occupational History  . 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 Smoking status: Never Smoker  . Smokeless tobacco: Never Used  Substance and Sexual Activity  . Alcohol use: Yes    Comment: socially  . Drug use: No  . Sexual activity: Not on file

## 2019-12-06 ENCOUNTER — Other Ambulatory Visit: Payer: Self-pay

## 2019-12-06 ENCOUNTER — Telehealth: Payer: Self-pay

## 2019-12-06 MED ORDER — MUPIROCIN 2 % EX OINT: 1.0000 "application " | TOPICAL_OINTMENT | Freq: Two times a day (BID) | CUTANEOUS | 0 refills | Status: DC

## 2019-12-06 NOTE — Telephone Encounter (Signed)
Pt called and states that she was in the office yesterday f/u CTR. She had stitches removed and woke up this morning after removing her wrist splint and her had had sweat and the steri strips that had been placed rolled off and the incision was gapped open. Spoke with Dr. Erlinda Hong and he advised that this can be a harder area to heal and that she can use bactroban ointment to the are to fil in the gap and cover with a band aid. This was sent to her pharmacy. Pt voiced understanding and she will call with any questions.

## 2019-12-17 ENCOUNTER — Encounter: Payer: Self-pay | Admitting: Nurse Practitioner

## 2019-12-18 ENCOUNTER — Inpatient Hospital Stay: Payer: 59 | Admitting: Orthopaedic Surgery

## 2020-01-02 ENCOUNTER — Ambulatory Visit: Payer: 59 | Admitting: Neurology

## 2020-01-02 ENCOUNTER — Other Ambulatory Visit: Payer: Self-pay

## 2020-01-02 ENCOUNTER — Encounter: Payer: Self-pay | Admitting: Neurology

## 2020-01-02 VITALS — BP 135/88 | HR 63 | Temp 97.4°F | Ht 62.0 in | Wt 185.5 lb

## 2020-01-02 DIAGNOSIS — R200 Anesthesia of skin: Secondary | ICD-10-CM | POA: Diagnosis not present

## 2020-01-02 DIAGNOSIS — G5603 Carpal tunnel syndrome, bilateral upper limbs: Secondary | ICD-10-CM

## 2020-01-02 DIAGNOSIS — M5412 Radiculopathy, cervical region: Secondary | ICD-10-CM | POA: Diagnosis not present

## 2020-01-02 NOTE — Progress Notes (Signed)
[facility]  PATIENT: Emily Braun DOB: [birth year]  REFERRING DOCTOR OR PCP: Sallee Lange SOURCE: Patient, notes from primary care, imaging reports.  _________________________________   HISTORICAL  CHIEF COMPLAINT:  Chief Complaint  Patient presents with  . Follow-up    RM 12, alone. Last seen 03/28/19. Here to f/u on right hand numb and pain. Has CTS and saw ortho. Has CTS surgery end of March 2021. They did inj in left hand at the same time.    HISTORY OF PRESENT ILLNESS:  Update 01/02/2020: Ah Trost is a 44 year old woman numbness in the hands.  Since the last, she has had carpal tunnel release on the right and also had a carpal tunnel injection by orthopedics on the left.  She feels that the symptoms on the right and left have both completely resolved.  She also appeared to have a mild chronic radiculopathy on EMG.  She reports that the neck pain and the arm pain have both improved.  Therefore, we will hold off on getting an MRI at this time.  She denies any symptoms more proximally in the arms or in her legs.  Gait and balance are fine.   She has some neck pain on occasion.  She has urinary urgency helped by oxybutynin  She has diabetes mellitus.  Impression: This NCV/EMG study shows neurophysiologic evidence of: 1.   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 2.   Mild median neuropathy across the left wrist 3.   Probable mild left C8 and possible minimal left C5 or C6 chronic radiculopathies   REVIEW OF SYSTEMS: Constitutional: No fevers, chills, sweats, or change in appetite Eyes: No visual changes, double vision, eye pain Ear, nose and throat: No hearing loss, ear pain, nasal congestion, sore throat Cardiovascular: No chest pain, palpitations Respiratory: No shortness of breath at rest or with exertion.   No wheezes GastrointestinaI: No nausea, vomiting, diarrhea.   she has GERD.   Genitourinary: No dysuria.  She has  urinary frequency. Musculoskeletal: No neck pain, back pain Integumentary: No rash, pruritus, skin lesions Neurological: as above Psychiatric: No depression at this time.  No anxiety Endocrine: No palpitations, diaphoresis, change in appetite, change in weigh or increased thirst Hematologic/Lymphatic: No anemia, purpura, petechiae. Allergic/Immunologic: No itchy/runny eyes, nasal congestion, recent allergic reactions, rashes  ALLERGIES: Allergies  Allergen Reactions  . Bydureon [Exenatide]     Caused knots in her skin as well as itching at the site of injection  . Metformin And Related Diarrhea  . Tradjenta [Linagliptin]     Patient relates abdominal cramps and diarrhea    HOME MEDICATIONS:  Current Outpatient Medications:  .  Calcium Carbonate-Vitamin D (CALCIUM + D PO), Take 600 mg by mouth daily., Disp: , Rfl:  .  citalopram (CELEXA) 20 MG tablet, Take 1 tablet by mouth once daily, Disp: 90 tablet, Rfl: 0 .  Dulaglutide (TRULICITY) 1.5 0000000 SOPN, INJECT 1 SYRINGE SUBCUTANEOUSLY ONCE A WEEK, Disp: 4 pen, Rfl: 5 .  fluticasone (FLONASE) 50 MCG/ACT nasal spray, Use 2 spray(s) in each nostril once daily, Disp: 16 g, Rfl: 2 .  Insulin Glargine (LANTUS SOLOSTAR) 100 UNIT/ML Solostar Pen, Inject 25 units nightly; may titrate up to 55 units max, Disp: 5 pen, Rfl: 5 .  lisinopril (ZESTRIL) 10 MG tablet, Take 1 tablet by mouth once daily, Disp: 90 tablet, Rfl: 0 .  loratadine (CLARITIN) 10 MG tablet, Take 10 mg by mouth daily., Disp: , Rfl:  .  pantoprazole (PROTONIX) 40 MG tablet, TAKE 1 TABLET BY MOUTH ONCE DAILY FOR  ACID  REFLUX, Disp: 90 tablet, Rfl: 1 .  pravastatin (PRAVACHOL) 20 MG tablet, TAKE 1 TABLET BY MOUTH ONCE DAILY ON  MONDAY  AND  FRIDAY, Disp: 4 tablet, Rfl: 0  Current Facility-Administered Medications:  .  0.9 %  sodium chloride infusion, 500 mL, Intravenous, Continuous, Danis, Kirke Corin, MD  PAST MEDICAL HISTORY: Past Medical History:  Diagnosis Date  .  Anxiety   . Depression   . GERD (gastroesophageal reflux disease)   . Hypertension    under control with med., has been on med. x 1 yr.  . Left maxillary sinusitis 02/2014  . Seasonal allergies     PAST SURGICAL HISTORY: Past Surgical History:  Procedure Laterality Date  . CHOLECYSTECTOMY  09/10/2007  . COLONOSCOPY WITH PROPOFOL  02/22/2013  . MAXILLARY ANTROSTOMY Left 03/10/2014   Procedure: LEFT MAXILLARY ANTROSTOMY;  Surgeon: Ascencion Dike, MD;  Location: [facility];  Service: ENT;  Laterality: Left;  . ORIF PATELLA FRACTURE Left   . WISDOM TOOTH EXTRACTION      FAMILY HISTORY: Family History  Problem Relation Age of Onset  . Diabetes Father   . Hypertension Father   . Diabetes Maternal Grandmother   . Diabetes Paternal Grandmother   . Hypertension Mother   . Cancer - Other Brother        testicular  . Esophageal cancer Neg Hx     SOCIAL HISTORY:  Social History   Socioeconomic History  . Marital status: Single    Spouse name: Not on file  . Number of children: 1  . Years of education: Not on file  . Highest education level: Not on file  Occupational History  . Occupation: IT trainer    Comment: [employer] [place]  Tobacco Use  . Smoking status: Never Smoker  . Smokeless tobacco: Never Used  Substance and Sexual Activity  . Alcohol use: Yes    Comment: socially  . Drug use: No  . Sexual activity: Not on file  Other Topics Concern  . Not on file  Social History Narrative   Lives with foster-son   Caffeine use: sometimes   Right handed    Social Determinants of Health   Financial Resource Strain:   . Difficulty of Paying Living Expenses:   Food Insecurity:   . Worried About Charity fundraiser in the Last Year:   . Arboriculturist in the Last Year:   Transportation Needs:   . Film/video editor (Medical):   Marland Kitchen Lack of Transportation (Non-Medical):   Physical Activity:   . Days of Exercise per Week:   . Minutes of  Exercise per Session:   Stress:   . Feeling of Stress :   Social Connections:   . Frequency of Communication with Friends and Family:   . Frequency of Social Gatherings with Friends and Family:   . Attends Religious Services:   . Active Member of Clubs or Organizations:   . Attends Archivist Meetings:   Marland Kitchen Marital Status:   Intimate Partner Violence:   . Fear of Current or Ex-Partner:   . Emotionally Abused:   Marland Kitchen Physically Abused:   . Sexually Abused:      PHYSICAL EXAM  Vitals:   01/02/20 1123  BP: 135/88  Pulse: 63  Temp: (!) 97.4 F (36.3 C)  Weight: 185 lb 8 oz (84.1 kg)  Height:  5\' 2"  (1.575 m)    Body mass index is 33.93 kg/m.   General: The patient is well-developed and well-nourished and in no acute distress   Neck:  The neck is nontender with good range of motion  Extremities: She has a right carpal tunnel release scar from surgery since her last visit   Neurologic Exam  Mental status: The patient is alert and oriented x 3 at the time of the examination.    Speech is normal.  Cranial nerves: Extraocular movements are full.    Motor:  Muscle bulk is normal.   Tone is normal. Strength is  5 / 5 in all 4 extremities including right abductor pollicis brevis.   Sensory: Sensory testing is intact to pinprick, soft touch and vibration sensation in the arms now  Gait and station: Station is normal.   Gait is normal.   Reflexes: Deep tendon reflexes are symmetric and normal in the arms  Other: Tinel's signs were present at both wrist and Phalen's sign was present on the right      ASSESSMENT AND PLAN  1. Bilateral carpal tunnel syndrome   2. Numbness of right hand   3. Cervical radiculopathy      1.   She is doing much better.  We will hold off on getting an MRI at this time.   2.   She can be seen as needed.  She is advised to let us know if she has any new or worsening neurologic symptoms.  Jenille Laszlo A. Felecia Shelling, MD, PhD, Charlynn Grimes [id number],  [id number] PM Certified in Neurology, Clinical Neurophysiology, Sleep Medicine and Neuroimaging  [facility] [address], [place], [place] [address] [phone]

## 2020-01-03 ENCOUNTER — Ambulatory Visit (INDEPENDENT_AMBULATORY_CARE_PROVIDER_SITE_OTHER): Payer: 59 | Admitting: Orthopaedic Surgery

## 2020-01-03 ENCOUNTER — Other Ambulatory Visit: Payer: Self-pay

## 2020-01-03 ENCOUNTER — Encounter: Payer: Self-pay | Admitting: Orthopaedic Surgery

## 2020-01-03 VITALS — Ht 62.0 in | Wt 185.0 lb

## 2020-01-03 DIAGNOSIS — Z9889 Other specified postprocedural states: Secondary | ICD-10-CM

## 2020-01-03 DIAGNOSIS — G5601 Carpal tunnel syndrome, right upper limb: Secondary | ICD-10-CM

## 2020-01-03 NOTE — Progress Notes (Signed)
   Post-Op Visit Note   Patient: Emily Braun           Date of Birth: 10-Oct-1975           MRN: [id number] Visit Date: 01/03/2020 PCP: Kathyrn Drown, MD   Assessment & Plan:  Chief Complaint:  Chief Complaint  Patient presents with  . Right Hand - Follow-up    11/21/2019 Right CTR   Visit Diagnoses:  1. S/P carpal tunnel release   2. Right carpal tunnel syndrome     Plan: Emily Braun is 6 weeks status post right carpal tunnel release.  Doing well and reports no pain.  She has had full resolution of her carpal tunnel symptoms.  She has some discomfort around the scar with lifting and pushing but otherwise no complaints.  At this point she is released to activity as tolerated.  Follow-up as needed.  Follow-Up Instructions: Return if symptoms worsen or fail to improve.   Orders:  No orders of the defined types were placed in this encounter.  No orders of the defined types were placed in this encounter.   Imaging: No results found.  PMFS History: Patient Active Problem List   Diagnosis Date Noted  . Cervical radiculopathy 01/02/2020  . Vulvar itching 11/15/2019  . Right carpal tunnel syndrome 10/18/2019  . Left carpal tunnel syndrome 10/18/2019  . Numbness of right hand 03/28/2019  . Bilateral hand pain 03/28/2019  . Bilateral carpal tunnel syndrome 03/22/2019  . Hyperlipidemia 03/16/2018  . DM (diabetes mellitus), type 2 ([place]) 08/28/2015  . Prediabetes 10/06/2014  . GERD (gastroesophageal reflux disease) 08/25/2014  . Morbid obesity ([place]) 08/14/2013  . Essential hypertension, benign 07/03/2013  . Colitis 12/20/2012   Past Medical History:  Diagnosis Date  . Anxiety   . Depression   . GERD (gastroesophageal reflux disease)   . Hypertension    under control with med., has been on med. x 1 yr.  . Left maxillary sinusitis 02/2014  . Seasonal allergies     Family History  Problem Relation Age of Onset  . Diabetes Father   . Hypertension Father   . Diabetes  Maternal Grandmother   . Diabetes Paternal Grandmother   . Hypertension Mother   . Cancer - Other Brother        testicular  . Esophageal cancer Neg Hx     Past Surgical History:  Procedure Laterality Date  . CHOLECYSTECTOMY  09/10/2007  . COLONOSCOPY WITH PROPOFOL  02/22/2013  . MAXILLARY ANTROSTOMY Left 03/10/2014   Procedure: LEFT MAXILLARY ANTROSTOMY;  Surgeon: Ascencion Dike, MD;  Location: [facility];  Service: ENT;  Laterality: Left;  . ORIF PATELLA FRACTURE Left   . WISDOM TOOTH EXTRACTION     Social History   Occupational History  . 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 Smoking status: Never Smoker  . Smokeless tobacco: Never Used  Substance and Sexual Activity  . Alcohol use: Yes    Comment: socially  . Drug use: No  . Sexual activity: Not on file

## 2020-02-20 ENCOUNTER — Other Ambulatory Visit: Payer: Self-pay | Admitting: Family Medicine

## 2020-03-13 ENCOUNTER — Ambulatory Visit: Payer: 59 | Admitting: Family Medicine

## 2020-03-23 ENCOUNTER — Other Ambulatory Visit: Payer: Self-pay | Admitting: *Deleted

## 2020-03-23 MED ORDER — LANTUS SOLOSTAR 100 UNIT/ML ~~LOC~~ SOPN: PEN_INJECTOR | SUBCUTANEOUS | 0 refills | Status: DC

## 2020-04-24 ENCOUNTER — Encounter: Payer: Self-pay | Admitting: Family Medicine

## 2020-04-24 ENCOUNTER — Ambulatory Visit: Payer: 59 | Admitting: Family Medicine

## 2020-04-24 ENCOUNTER — Other Ambulatory Visit: Payer: Self-pay

## 2020-04-24 VITALS — BP 112/76 | Temp 97.5°F | Wt 187.4 lb

## 2020-04-24 DIAGNOSIS — E7849 Other hyperlipidemia: Secondary | ICD-10-CM

## 2020-04-24 DIAGNOSIS — Z79899 Other long term (current) drug therapy: Secondary | ICD-10-CM

## 2020-04-24 DIAGNOSIS — E119 Type 2 diabetes mellitus without complications: Secondary | ICD-10-CM | POA: Diagnosis not present

## 2020-04-24 DIAGNOSIS — Z794 Long term (current) use of insulin: Secondary | ICD-10-CM

## 2020-04-24 DIAGNOSIS — I10 Essential (primary) hypertension: Secondary | ICD-10-CM

## 2020-04-24 LAB — POCT GLYCOSYLATED HEMOGLOBIN (HGB A1C): Hemoglobin A1C: 4.8 % (ref 4.0–5.6)

## 2020-04-24 MED ORDER — CITALOPRAM HYDROBROMIDE 20 MG PO TABS: 20.0000 mg | ORAL_TABLET | Freq: Every day | ORAL | 1 refills | Status: DC

## 2020-04-24 MED ORDER — PRAVASTATIN SODIUM 20 MG PO TABS: ORAL_TABLET | ORAL | 2 refills | Status: DC

## 2020-04-24 MED ORDER — LANTUS SOLOSTAR 100 UNIT/ML ~~LOC~~ SOPN: PEN_INJECTOR | SUBCUTANEOUS | 6 refills | Status: DC

## 2020-04-24 MED ORDER — LISINOPRIL 10 MG PO TABS: 10.0000 mg | ORAL_TABLET | Freq: Every day | ORAL | 1 refills | Status: DC

## 2020-04-24 MED ORDER — PANTOPRAZOLE SODIUM 40 MG PO TBEC: DELAYED_RELEASE_TABLET | ORAL | 1 refills | Status: DC

## 2020-04-24 MED ORDER — TRULICITY 1.5 MG/0.5ML ~~LOC~~ SOAJ: SUBCUTANEOUS | 1 refills | Status: DC

## 2020-04-24 MED ORDER — TRIAMCINOLONE ACETONIDE 0.1 % EX CREA
1.0000 | TOPICAL_CREAM | Freq: Two times a day (BID) | CUTANEOUS | 1 refills | Status: DC
Start: 2020-04-24 — End: 2021-10-22

## 2020-04-24 NOTE — Progress Notes (Signed)
Subjective:    Patient ID: Emily Braun, female    DOB: 06/30/1976, 44 y.o.   MRN: [id number]  Hypertension This is a chronic problem. Pertinent negatives include no chest pain or shortness of breath. Compliance problems include diet and exercise.   Diabetes She presents for her follow-up diabetic visit. She has type 2 diabetes mellitus. Pertinent negatives for hypoglycemia include no confusion or dizziness. Pertinent negatives for diabetes include no chest pain, no fatigue, no polydipsia, no polyphagia and no weakness. Current diabetic treatments: lisinopril. She is compliant with treatment all of the time.  Hyperlipidemia This is a chronic problem. Pertinent negatives include no chest pain or shortness of breath. Treatments tried: pravastatin.    Results for orders placed or performed in visit on 04/24/20  POCT glycosylated hemoglobin (Hb A1C)  Result Value Ref Range   Hemoglobin A1C 4.8 4.0 - 5.6 %   HbA1c POC (<> result, manual entry)     HbA1c, POC (prediabetic range)     HbA1c, POC (controlled diabetic range)    under some stress utilizing citalopram is helping her she does not want to change away from this currently.  Living with her parents because her house was sold and the new one is being renovated   Review of Systems  Constitutional: Negative for activity change, appetite change and fatigue.  HENT: Negative for congestion and rhinorrhea.   Respiratory: Negative for cough and shortness of breath.   Cardiovascular: Negative for chest pain and leg swelling.  Gastrointestinal: Negative for abdominal pain and diarrhea.  Endocrine: Negative for polydipsia and polyphagia.  Skin: Negative for color change.  Neurological: Negative for dizziness and weakness.  Psychiatric/Behavioral: Negative for behavioral problems and confusion.       Objective:   Physical Exam Vitals reviewed.  Constitutional:      General: She is not in acute distress. HENT:     Head: Normocephalic  and atraumatic.  Eyes:     General:        Right eye: No discharge.        Left eye: No discharge.  Neck:     Trachea: No tracheal deviation.  Cardiovascular:     Rate and Rhythm: Normal rate and regular rhythm.     Heart sounds: Normal heart sounds. No murmur heard.   Pulmonary:     Effort: Pulmonary effort is normal. No respiratory distress.     Breath sounds: Normal breath sounds.  Lymphadenopathy:     Cervical: No cervical adenopathy.  Skin:    General: Skin is warm and dry.  Neurological:     Mental Status: She is alert.     Coordination: Coordination normal.  Psychiatric:        Behavior: Behavior normal.     Diabetic foot exam normal      Assessment & Plan:  1. Type 2 diabetes mellitus without complication, with long-term current use of insulin ([place]) based upon what patient is stating her glucoses are more than likely some controlled.  Therefore check A1c await the results.  I do not trust that in office A1c today it does not seem to be in line with what the patient is stating her sugars are she is to work harder on dietary measures activity and trying to get her numbers down - POCT glycosylated hemoglobin (Hb A1C) - Hemoglobin A1c  2. Essential hypertension, benign blood pressure good control continue current measures - Basic metabolic panel  3. Other hyperlipidemia previous labs look good new  labs ordered await the results continue medication important to keep LDL below 70 - Lipid panel  4. High risk medication use we'll monitor liver functions - Hepatic function panel mild obesity importance of watching diet stay active Trulicity helping

## 2020-04-25 LAB — BASIC METABOLIC PANEL
BUN/Creatinine Ratio: 20 (ref 9–23)
BUN: 16 mg/dL (ref 6–24)
CO2: 25 mmol/L (ref 20–29)
Calcium: 9.6 mg/dL (ref 8.7–10.2)
Chloride: 101 mmol/L (ref 96–106)
Creatinine, Ser: 0.79 mg/dL (ref 0.57–1.00)
GFR calc Af Amer: 107 mL/min/{1.73_m2} (ref >59)
GFR calc non Af Amer: 93 mL/min/{1.73_m2} (ref >59)
Glucose: 92 mg/dL (ref 65–99)
Potassium: 4.6 mmol/L (ref 3.5–5.2)
Sodium: 140 mmol/L (ref 134–144)

## 2020-04-25 LAB — HEPATIC FUNCTION PANEL
ALT: 17 IU/L (ref 0–32)
AST: 15 IU/L (ref 0–40)
Albumin: 4.6 g/dL (ref 3.8–4.8)
Alkaline Phosphatase: 62 IU/L (ref 48–121)
Bilirubin Total: 0.5 mg/dL (ref 0.0–1.2)
Bilirubin, Direct: 0.2 mg/dL (ref 0.00–0.40)
Total Protein: 7.1 g/dL (ref 6.0–8.5)

## 2020-04-25 LAB — LIPID PANEL
Chol/HDL Ratio: 1.5 ratio (ref 0.0–4.4)
Cholesterol, Total: 104 mg/dL (ref 100–199)
HDL: 71 mg/dL (ref >39)
LDL Chol Calc (NIH): 24 mg/dL (ref 0–99)
Triglycerides: 26 mg/dL (ref 0–149)
VLDL Cholesterol Cal: 9 mg/dL (ref 5–40)

## 2020-04-25 LAB — HEMOGLOBIN A1C
Est. average glucose Bld gHb Est-mCnc: 108 mg/dL
Hgb A1c MFr Bld: 5.4 % (ref 4.8–5.6)

## 2020-05-07 ENCOUNTER — Other Ambulatory Visit (HOSPITAL_COMMUNITY): Payer: Self-pay | Admitting: Family Medicine

## 2020-05-07 DIAGNOSIS — Z1231 Encounter for screening mammogram for malignant neoplasm of breast: Secondary | ICD-10-CM

## 2020-05-20 ENCOUNTER — Encounter: Payer: Self-pay | Admitting: Family Medicine

## 2020-05-20 NOTE — Telephone Encounter (Signed)
Phone note sent to Dr. Nicki Reaper from patients chart.

## 2020-05-22 ENCOUNTER — Ambulatory Visit (HOSPITAL_COMMUNITY): Payer: 59

## 2020-05-25 ENCOUNTER — Other Ambulatory Visit: Payer: Self-pay | Admitting: Family Medicine

## 2020-05-25 ENCOUNTER — Other Ambulatory Visit: Payer: Self-pay | Admitting: Nurse Practitioner

## 2020-06-02 ENCOUNTER — Encounter: Payer: Self-pay | Admitting: Family Medicine

## 2020-06-04 ENCOUNTER — Other Ambulatory Visit: Payer: Self-pay | Admitting: *Deleted

## 2020-06-04 MED ORDER — CHLORZOXAZONE 500 MG PO TABS: ORAL_TABLET | ORAL | 0 refills | Status: DC

## 2020-06-04 NOTE — Telephone Encounter (Signed)
Nurses I looked through the chart unable to find exactly what we have tried in the past It is hard to treat these type of issues just by a message.  If the patient would like to try muscle relaxer in the evening time Parafon forte may be used 1 each evening as needed, #14, may do stretches and OTC anti-inflammatories as needed  Ideally the patient will need to be seen for more thorough complete evaluation.  This can be with myself or with Santiago Glad

## 2020-06-17 ENCOUNTER — Other Ambulatory Visit: Payer: Self-pay

## 2020-06-17 ENCOUNTER — Encounter: Payer: Self-pay | Admitting: Family Medicine

## 2020-06-17 ENCOUNTER — Ambulatory Visit: Payer: 59 | Admitting: Family Medicine

## 2020-06-17 VITALS — BP 134/82 | HR 88 | Temp 97.4°F | Ht 62.0 in | Wt 188.0 lb

## 2020-06-17 DIAGNOSIS — M5412 Radiculopathy, cervical region: Secondary | ICD-10-CM

## 2020-06-17 MED ORDER — CHLORZOXAZONE 500 MG PO TABS
ORAL_TABLET | ORAL | 1 refills | Status: DC
Start: 2020-06-17 — End: 2021-03-05

## 2020-06-17 NOTE — Progress Notes (Signed)
   Subjective:    Patient ID: Emily Braun, female    DOB: Sep 20, 1975, 44 y.o.   MRN: [id number]  HPIneck pain. Started about one month ago. Pain causing headache and nausea. Tried parafon forte. Severe neck pain worse on the left side.  Radiates into the trapezius.  Also goes down to the shoulder some numbness and tingling into the hand also pain and discomfort down the arm.  Has tried positioning is tried stretches tried anti-inflammatories without success.  This been going on for quite some time.  Patient was seen by specialist who recommended an MRI but currently the patient put that off but now states it is so unbearable that she is willing to go forward with doing the MRI.  She has never had surgery before.  Pain does wake her up at night along with the numbness   Review of Systems  Constitutional: Negative for activity change and appetite change.  HENT: Negative for congestion and rhinorrhea.   Respiratory: Negative for cough and shortness of breath.   Cardiovascular: Negative for chest pain and leg swelling.  Gastrointestinal: Negative for abdominal pain, nausea and vomiting.  Musculoskeletal: Positive for back pain. Negative for arthralgias.  Skin: Negative for color change.  Neurological: Positive for numbness. Negative for dizziness and weakness.  Psychiatric/Behavioral: Negative for agitation and confusion.       Objective:   Physical Exam Vitals reviewed.  Constitutional:      General: She is not in acute distress. HENT:     Head: Normocephalic.  Cardiovascular:     Rate and Rhythm: Normal rate and regular rhythm.     Heart sounds: Normal heart sounds. No murmur heard.   Pulmonary:     Effort: Pulmonary effort is normal.     Breath sounds: Normal breath sounds.  Lymphadenopathy:     Cervical: No cervical adenopathy.  Neurological:     Mental Status: She is alert.  Psychiatric:        Behavior: Behavior normal.    Strength in the arm is good.  Reflexes good.   Subjective discomfort down the arm as well as numbness tingling into the hand.  I do not feel that this is carpal tunnel.       Assessment & Plan:  1. Cervical radiculopathy Has radicular pattern as well as significant pain and discomfort would benefit from having an MRI to further delineate this.  The patient has seen Dr.Xu in the past who wanted to do an MRI but she deferred on at that time.  She had been evaluated for carpal tunnel at that time and it is felt that it was severe on the right but not in the left.  MRI is indicated. - MR Cervical Spine Wo Contrast

## 2020-06-19 ENCOUNTER — Ambulatory Visit (HOSPITAL_COMMUNITY): Payer: 59

## 2020-06-20 ENCOUNTER — Emergency Department (HOSPITAL_COMMUNITY): Payer: Worker's Compensation

## 2020-06-20 ENCOUNTER — Encounter (HOSPITAL_COMMUNITY): Payer: Self-pay | Admitting: Emergency Medicine

## 2020-06-20 ENCOUNTER — Other Ambulatory Visit: Payer: Self-pay

## 2020-06-20 ENCOUNTER — Emergency Department (HOSPITAL_COMMUNITY)
Admission: EM | Admit: 2020-06-20 | Discharge: 2020-06-21 | Disposition: A | Discharge status: Home / Self Care | Payer: Worker's Compensation | Attending: Emergency Medicine | Admitting: Emergency Medicine

## 2020-06-20 DIAGNOSIS — S42222A 2-part displaced fracture of surgical neck of left humerus, initial encounter for closed fracture: Secondary | ICD-10-CM | POA: Diagnosis not present

## 2020-06-20 DIAGNOSIS — S4992XA Unspecified injury of left shoulder and upper arm, initial encounter: Secondary | ICD-10-CM | POA: Diagnosis present

## 2020-06-20 DIAGNOSIS — Z79899 Other long term (current) drug therapy: Secondary | ICD-10-CM | POA: Insufficient documentation

## 2020-06-20 DIAGNOSIS — Z23 Encounter for immunization: Secondary | ICD-10-CM | POA: Insufficient documentation

## 2020-06-20 DIAGNOSIS — M542 Cervicalgia: Secondary | ICD-10-CM | POA: Diagnosis not present

## 2020-06-20 DIAGNOSIS — S42202A Unspecified fracture of upper end of left humerus, initial encounter for closed fracture: Secondary | ICD-10-CM

## 2020-06-20 DIAGNOSIS — S80212A Abrasion, left knee, initial encounter: Secondary | ICD-10-CM | POA: Insufficient documentation

## 2020-06-20 DIAGNOSIS — G8929 Other chronic pain: Secondary | ICD-10-CM | POA: Diagnosis not present

## 2020-06-20 DIAGNOSIS — I10 Essential (primary) hypertension: Secondary | ICD-10-CM | POA: Insufficient documentation

## 2020-06-20 MED ORDER — HYDROCODONE-ACETAMINOPHEN 5-325 MG PO TABS: 1.0000 | ORAL_TABLET | Freq: Four times a day (QID) | ORAL | 0 refills | Status: DC | PRN

## 2020-06-20 MED ORDER — HYDROMORPHONE HCL 1 MG/ML IJ SOLN: 1.0000 mg | Freq: Once | INTRAMUSCULAR | Status: DC

## 2020-06-20 MED ORDER — TETANUS-DIPHTH-ACELL PERTUSSIS 5-2.5-18.5 LF-MCG/0.5 IM SUSP
0.5000 mL | Freq: Once | INTRAMUSCULAR | Status: AC
  Administered 2020-06-20: 0.5 mL via INTRAMUSCULAR
  Filled 2020-06-20: qty 0.5

## 2020-06-20 MED ORDER — MORPHINE SULFATE (PF) 4 MG/ML IV SOLN
4.0000 mg | Freq: Once | INTRAVENOUS | Status: AC
  Administered 2020-06-20: 4 mg via INTRAVENOUS
  Filled 2020-06-20: qty 1

## 2020-06-20 NOTE — ED Triage Notes (Signed)
Pt was at a 4H event and someone was driving her to her car on a gold cart   [place]   [place]   Now with pain to her L arm  8/10   Pain to her L leg 3/10  Guarding arm and will not allow BP due to holding her L arm

## 2020-06-20 NOTE — Discharge Instructions (Addendum)
It was our pleasure to provide your ER care today - we hope that you feel better.  Wear shoulder sling/immobilizer.   Icepack/cold to sore area.   Take motrin or aleve as need for pain. You may also take hydrocodone as need for pain. No driving for the next 6 hours or when taking hydrocodone. Also, do not take tylenol or acetaminophen containing medication when taking hydrocodone.  Follow up with orthopedist this coming week - call office Monday AM to arrange appointment.  Return to ER if worse, new symptoms, new, severe or intractable pain, numbness/weakness, or other concern.

## 2020-06-20 NOTE — ED Notes (Signed)
PT is ready for CT.

## 2020-06-20 NOTE — ED Notes (Signed)
Pt placed left shoulder in sling and tolerated well. Pt  states "I want to hold off on any additional pain medicine at this time".

## 2020-06-20 NOTE — ED Notes (Signed)
PT educated with DC instructions and plan of care, denies questions at this time. Pt declined pain meds prior to DC and IV was removed fully intact and dressing applied. Pt awaiting transport by family, taken to exit via wheelchair.
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
## 2020-06-21 DIAGNOSIS — S42202A Unspecified fracture of upper end of left humerus, initial encounter for closed fracture: Secondary | ICD-10-CM

## 2020-06-21 HISTORY — DX: Unspecified fracture of upper end of left humerus, initial encounter for closed fracture: S42.202A

## 2020-06-23 ENCOUNTER — Ambulatory Visit
Admission: RE | Admit: 2020-06-23 | Discharge: 2020-06-23 | Disposition: A | Discharge status: Home / Self Care | Payer: Self-pay | Source: Ambulatory Visit | Attending: Orthopedic Surgery | Admitting: Orthopedic Surgery

## 2020-06-23 ENCOUNTER — Other Ambulatory Visit: Payer: Self-pay

## 2020-06-23 ENCOUNTER — Other Ambulatory Visit: Payer: Self-pay | Admitting: Orthopedic Surgery

## 2020-06-23 DIAGNOSIS — M25512 Pain in left shoulder: Secondary | ICD-10-CM

## 2020-06-24 ENCOUNTER — Encounter (HOSPITAL_BASED_OUTPATIENT_CLINIC_OR_DEPARTMENT_OTHER): Payer: Self-pay | Admitting: Orthopedic Surgery

## 2020-06-25 ENCOUNTER — Other Ambulatory Visit: Payer: Self-pay

## 2020-06-25 ENCOUNTER — Encounter (HOSPITAL_BASED_OUTPATIENT_CLINIC_OR_DEPARTMENT_OTHER): Payer: Self-pay | Admitting: Orthopedic Surgery

## 2020-06-25 NOTE — Progress Notes (Signed)
Spoke w/ via phone for pre-op interview--- PT Lab needs dos----  CBC, BMP, EKG, Urine preg             Lab results------ no COVID test ------ 06-26-2020 @ 1200 Arrive at ------- 1045 NPO after MN NO Solid Food.  Clear liquids from MN until--- 0945 Medications to take morning of surgery ----- Oxybutynin, Flonase spray Diabetic medication ----- do half Lantus dose night before surgery Patient Special Instructions ----- n/a Pre-Op special Istructions ----- n/a Patient verbalized understanding of instructions that were given at this phone interview. Patient denies shortness of breath, chest pain, fever, cough at this phone interview.   Anesthesia :  HTN,  IDDM.  Pt stated she does not check blood sugar at home and her last A1c 5.4 on 04-24-2020.    PCP:  Dr Sallee Lange Cassell Clement 04-24-2020 and 06-17-2020 epic) Cardiologist :  no Chest x-ray : no EKG :  09-05-2013  (printed copy from media tab , placed in chart) Echo : no Stress test: never had one per pt Cardiac Cath :  no Activity level:   No issues with sob with any activity Sleep Study/ CPAP :  NO Fasting Blood Sugar :      / Checks Blood Sugar -- times a day:   Pt does not check Blood Thinner/ Instructions /Last Dose: NO ASA / Instructions/ Last Dose :  NO
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
## 2020-06-26 ENCOUNTER — Other Ambulatory Visit (HOSPITAL_COMMUNITY)
Admission: RE | Admit: 2020-06-26 | Discharge: 2020-06-26 | Disposition: A | Discharge status: Home / Self Care | Payer: 59 | Source: Ambulatory Visit | Attending: Orthopedic Surgery | Admitting: Orthopedic Surgery

## 2020-06-26 ENCOUNTER — Ambulatory Visit (HOSPITAL_COMMUNITY): Payer: 59

## 2020-06-26 DIAGNOSIS — Z01812 Encounter for preprocedural laboratory examination: Secondary | ICD-10-CM | POA: Diagnosis not present

## 2020-06-26 DIAGNOSIS — Z20822 Contact with and (suspected) exposure to covid-19: Secondary | ICD-10-CM | POA: Diagnosis not present

## 2020-06-27 LAB — SARS CORONAVIRUS 2 (TAT 6-24 HRS): SARS Coronavirus 2: NEGATIVE

## 2020-06-30 ENCOUNTER — Ambulatory Visit (HOSPITAL_BASED_OUTPATIENT_CLINIC_OR_DEPARTMENT_OTHER): Payer: Worker's Compensation | Admitting: Anesthesiology

## 2020-06-30 ENCOUNTER — Ambulatory Visit (HOSPITAL_COMMUNITY): Payer: Worker's Compensation | Attending: Orthopedic Surgery

## 2020-06-30 ENCOUNTER — Encounter (HOSPITAL_BASED_OUTPATIENT_CLINIC_OR_DEPARTMENT_OTHER)
Admission: RE | Disposition: A | Discharge status: Home / Self Care | Payer: Self-pay | Source: Home / Self Care | Attending: Orthopedic Surgery

## 2020-06-30 ENCOUNTER — Encounter (HOSPITAL_BASED_OUTPATIENT_CLINIC_OR_DEPARTMENT_OTHER): Payer: Self-pay | Admitting: Orthopedic Surgery

## 2020-06-30 ENCOUNTER — Other Ambulatory Visit: Payer: Self-pay

## 2020-06-30 ENCOUNTER — Ambulatory Visit (HOSPITAL_BASED_OUTPATIENT_CLINIC_OR_DEPARTMENT_OTHER)
Admission: RE | Admit: 2020-06-30 | Discharge: 2020-06-30 | Disposition: A | Discharge status: Home / Self Care | Payer: Worker's Compensation | Attending: Orthopedic Surgery | Admitting: Orthopedic Surgery

## 2020-06-30 DIAGNOSIS — Y990 Civilian activity done for income or pay: Secondary | ICD-10-CM | POA: Insufficient documentation

## 2020-06-30 DIAGNOSIS — Z888 Allergy status to other drugs, medicaments and biological substances status: Secondary | ICD-10-CM | POA: Diagnosis not present

## 2020-06-30 DIAGNOSIS — X58XXXA Exposure to other specified factors, initial encounter: Secondary | ICD-10-CM | POA: Insufficient documentation

## 2020-06-30 DIAGNOSIS — W1789XA Other fall from one level to another, initial encounter: Secondary | ICD-10-CM | POA: Insufficient documentation

## 2020-06-30 DIAGNOSIS — S42212B Unspecified displaced fracture of surgical neck of left humerus, initial encounter for open fracture: Secondary | ICD-10-CM | POA: Diagnosis not present

## 2020-06-30 DIAGNOSIS — Z419 Encounter for procedure for purposes other than remedying health state, unspecified: Secondary | ICD-10-CM

## 2020-06-30 DIAGNOSIS — S42212A Unspecified displaced fracture of surgical neck of left humerus, initial encounter for closed fracture: Secondary | ICD-10-CM | POA: Insufficient documentation

## 2020-06-30 HISTORY — DX: Unspecified fracture of upper end of left humerus, initial encounter for closed fracture: S42.202A

## 2020-06-30 HISTORY — DX: Irritable bowel syndrome, unspecified: K58.9

## 2020-06-30 HISTORY — DX: Hyperlipidemia, unspecified: E78.5

## 2020-06-30 HISTORY — DX: Carpal tunnel syndrome, left upper limb: G56.02

## 2020-06-30 HISTORY — DX: Mixed incontinence: N39.46

## 2020-06-30 HISTORY — DX: Unspecified osteoarthritis, unspecified site: M19.90

## 2020-06-30 HISTORY — DX: Presence of spectacles and contact lenses: Z97.3

## 2020-06-30 HISTORY — DX: Type 2 diabetes mellitus without complications: E11.9

## 2020-06-30 HISTORY — DX: Long term (current) use of insulin: Z79.4

## 2020-06-30 HISTORY — PX: ORIF HUMERUS FRACTURE: SHX2126

## 2020-06-30 LAB — POCT PREGNANCY, URINE: Preg Test, Ur: NEGATIVE

## 2020-06-30 SURGERY — OPEN REDUCTION INTERNAL FIXATION (ORIF) PROXIMAL HUMERUS FRACTURE
Anesthesia: General | Site: Arm Upper | Laterality: Left

## 2020-06-30 MED ORDER — ONDANSETRON HCL 4 MG/2ML IJ SOLN
INTRAMUSCULAR | Status: AC
  Filled 2020-06-30: qty 2

## 2020-06-30 MED ORDER — ACETAMINOPHEN 10 MG/ML IV SOLN
INTRAVENOUS | Status: AC
  Filled 2020-06-30: qty 100

## 2020-06-30 MED ORDER — LIDOCAINE 2% (20 MG/ML) 5 ML SYRINGE
INTRAMUSCULAR | Status: AC
  Filled 2020-06-30: qty 5

## 2020-06-30 MED ORDER — BUPIVACAINE-EPINEPHRINE (PF) 0.5% -1:200000 IJ SOLN
INTRAMUSCULAR | Status: DC | PRN
  Administered 2020-06-30 (×5): 3 mL via PERINEURAL

## 2020-06-30 MED ORDER — EPHEDRINE SULFATE-NACL 50-0.9 MG/10ML-% IV SOSY
PREFILLED_SYRINGE | INTRAVENOUS | Status: DC | PRN
  Administered 2020-06-30: 5 mg via INTRAVENOUS
  Administered 2020-06-30 (×3): 10 mg via INTRAVENOUS
  Administered 2020-06-30: 5 mg via INTRAVENOUS

## 2020-06-30 MED ORDER — OXYCODONE HCL 5 MG PO TABS
5.0000 mg | ORAL_TABLET | Freq: Once | ORAL | Status: AC | PRN
  Administered 2020-06-30: 5 mg via ORAL

## 2020-06-30 MED ORDER — SUGAMMADEX SODIUM 200 MG/2ML IV SOLN
INTRAVENOUS | Status: DC | PRN
  Administered 2020-06-30: 200 mg via INTRAVENOUS

## 2020-06-30 MED ORDER — FENTANYL CITRATE (PF) 100 MCG/2ML IJ SOLN
INTRAMUSCULAR | Status: AC
  Filled 2020-06-30: qty 2

## 2020-06-30 MED ORDER — FENTANYL CITRATE (PF) 100 MCG/2ML IJ SOLN
25.0000 ug | INTRAMUSCULAR | Status: DC | PRN
  Administered 2020-06-30 (×2): 25 ug via INTRAVENOUS

## 2020-06-30 MED ORDER — TRANEXAMIC ACID-NACL 1000-0.7 MG/100ML-% IV SOLN
1000.0000 mg | INTRAVENOUS | Status: AC
  Administered 2020-06-30: 1000 mg via INTRAVENOUS

## 2020-06-30 MED ORDER — MIDAZOLAM HCL 2 MG/2ML IJ SOLN
INTRAMUSCULAR | Status: AC
  Filled 2020-06-30: qty 2

## 2020-06-30 MED ORDER — CEFAZOLIN SODIUM-DEXTROSE 2-4 GM/100ML-% IV SOLN
INTRAVENOUS | Status: AC
  Filled 2020-06-30: qty 100

## 2020-06-30 MED ORDER — ROCURONIUM BROMIDE 10 MG/ML (PF) SYRINGE
PREFILLED_SYRINGE | INTRAVENOUS | Status: AC
  Filled 2020-06-30: qty 10

## 2020-06-30 MED ORDER — ONDANSETRON HCL 4 MG/2ML IJ SOLN: 4.0000 mg | Freq: Once | INTRAMUSCULAR | Status: DC | PRN

## 2020-06-30 MED ORDER — TRANEXAMIC ACID-NACL 1000-0.7 MG/100ML-% IV SOLN
INTRAVENOUS | Status: AC
  Filled 2020-06-30: qty 100

## 2020-06-30 MED ORDER — BUPIVACAINE LIPOSOME 1.3 % IJ SUSP
INTRAMUSCULAR | Status: DC | PRN
  Administered 2020-06-30 (×5): 2 mL via PERINEURAL

## 2020-06-30 MED ORDER — MIDAZOLAM HCL 2 MG/2ML IJ SOLN
INTRAMUSCULAR | Status: DC | PRN
  Administered 2020-06-30 (×2): 1 mg via INTRAVENOUS

## 2020-06-30 MED ORDER — 0.9 % SODIUM CHLORIDE (POUR BTL) OPTIME
TOPICAL | Status: DC | PRN
  Administered 2020-06-30: 500 mL

## 2020-06-30 MED ORDER — KETOROLAC TROMETHAMINE 30 MG/ML IJ SOLN
30.0000 mg | Freq: Once | INTRAMUSCULAR | Status: AC | PRN
  Administered 2020-06-30: 30 mg via INTRAVENOUS

## 2020-06-30 MED ORDER — PHENYLEPHRINE 40 MCG/ML (10ML) SYRINGE FOR IV PUSH (FOR BLOOD PRESSURE SUPPORT)
PREFILLED_SYRINGE | INTRAVENOUS | Status: AC
  Filled 2020-06-30: qty 20

## 2020-06-30 MED ORDER — LIDOCAINE 2% (20 MG/ML) 5 ML SYRINGE
INTRAMUSCULAR | Status: DC | PRN
  Administered 2020-06-30: 100 mg via INTRAVENOUS

## 2020-06-30 MED ORDER — SUGAMMADEX SODIUM 200 MG/2ML IV SOLN: INTRAVENOUS | Status: DC | PRN

## 2020-06-30 MED ORDER — OXYCODONE HCL 5 MG/5ML PO SOLN: 5.0000 mg | Freq: Once | ORAL | Status: AC | PRN

## 2020-06-30 MED ORDER — DEXAMETHASONE SODIUM PHOSPHATE 10 MG/ML IJ SOLN
INTRAMUSCULAR | Status: DC | PRN
  Administered 2020-06-30: 5 mg via INTRAVENOUS

## 2020-06-30 MED ORDER — PHENYLEPHRINE HCL-NACL 10-0.9 MG/250ML-% IV SOLN
INTRAVENOUS | Status: DC | PRN
  Administered 2020-06-30: 50 ug/min via INTRAVENOUS

## 2020-06-30 MED ORDER — EPHEDRINE 5 MG/ML INJ
INTRAVENOUS | Status: AC
  Filled 2020-06-30: qty 10

## 2020-06-30 MED ORDER — PROPOFOL 10 MG/ML IV BOLUS
INTRAVENOUS | Status: DC | PRN
  Administered 2020-06-30: 200 mg via INTRAVENOUS

## 2020-06-30 MED ORDER — OXYCODONE HCL 5 MG PO TABS
ORAL_TABLET | ORAL | Status: AC
  Filled 2020-06-30: qty 1

## 2020-06-30 MED ORDER — FENTANYL CITRATE (PF) 100 MCG/2ML IJ SOLN
INTRAMUSCULAR | Status: DC | PRN
  Administered 2020-06-30 (×2): 50 ug via INTRAVENOUS

## 2020-06-30 MED ORDER — PHENYLEPHRINE HCL (PRESSORS) 10 MG/ML IV SOLN
INTRAVENOUS | Status: AC
  Filled 2020-06-30: qty 1

## 2020-06-30 MED ORDER — OXYCODONE HCL 5 MG PO TABS
5.0000 mg | ORAL_TABLET | Freq: Three times a day (TID) | ORAL | 0 refills | Status: DC | PRN
Start: 2020-06-30 — End: 2021-03-05

## 2020-06-30 MED ORDER — LACTATED RINGERS IV SOLN: INTRAVENOUS | Status: DC

## 2020-06-30 MED ORDER — ONDANSETRON HCL 4 MG/2ML IJ SOLN
INTRAMUSCULAR | Status: DC | PRN
  Administered 2020-06-30: 4 mg via INTRAVENOUS

## 2020-06-30 MED ORDER — DEXAMETHASONE SODIUM PHOSPHATE 10 MG/ML IJ SOLN
INTRAMUSCULAR | Status: AC
  Filled 2020-06-30: qty 1

## 2020-06-30 MED ORDER — KETOROLAC TROMETHAMINE 30 MG/ML IJ SOLN
INTRAMUSCULAR | Status: AC
  Filled 2020-06-30: qty 1

## 2020-06-30 MED ORDER — MIDAZOLAM HCL 2 MG/2ML IJ SOLN
2.0000 mg | Freq: Once | INTRAMUSCULAR | Status: AC
  Administered 2020-06-30: 2 mg via INTRAVENOUS

## 2020-06-30 MED ORDER — FENTANYL CITRATE (PF) 100 MCG/2ML IJ SOLN
100.0000 ug | Freq: Once | INTRAMUSCULAR | Status: AC
  Administered 2020-06-30: 100 ug via INTRAVENOUS

## 2020-06-30 MED ORDER — MEPERIDINE HCL 25 MG/ML IJ SOLN: 6.2500 mg | INTRAMUSCULAR | Status: DC | PRN

## 2020-06-30 MED ORDER — PROPOFOL 10 MG/ML IV BOLUS
INTRAVENOUS | Status: AC
  Filled 2020-06-30: qty 20

## 2020-06-30 MED ORDER — ACETAMINOPHEN 160 MG/5ML PO SOLN: 325.0000 mg | ORAL | Status: DC | PRN

## 2020-06-30 MED ORDER — CEFAZOLIN SODIUM-DEXTROSE 2-4 GM/100ML-% IV SOLN
2.0000 g | INTRAVENOUS | Status: AC
  Administered 2020-06-30: 2 g via INTRAVENOUS

## 2020-06-30 MED ORDER — ASPIRIN EC 81 MG PO TBEC: 81.0000 mg | DELAYED_RELEASE_TABLET | Freq: Two times a day (BID) | ORAL | 0 refills | Status: AC

## 2020-06-30 MED ORDER — ROCURONIUM BROMIDE 10 MG/ML (PF) SYRINGE
PREFILLED_SYRINGE | INTRAVENOUS | Status: DC | PRN
  Administered 2020-06-30: 10 mg via INTRAVENOUS
  Administered 2020-06-30: 50 mg via INTRAVENOUS

## 2020-06-30 MED ORDER — PHENYLEPHRINE 40 MCG/ML (10ML) SYRINGE FOR IV PUSH (FOR BLOOD PRESSURE SUPPORT)
PREFILLED_SYRINGE | INTRAVENOUS | Status: DC | PRN
  Administered 2020-06-30: 160 ug via INTRAVENOUS
  Administered 2020-06-30: 120 ug via INTRAVENOUS
  Administered 2020-06-30: 80 ug via INTRAVENOUS
  Administered 2020-06-30: 160 ug via INTRAVENOUS
  Administered 2020-06-30: 80 ug via INTRAVENOUS
  Administered 2020-06-30: 120 ug via INTRAVENOUS
  Administered 2020-06-30: 80 ug via INTRAVENOUS

## 2020-06-30 MED ORDER — ACETAMINOPHEN 10 MG/ML IV SOLN
1000.0000 mg | Freq: Once | INTRAVENOUS | Status: DC | PRN
  Administered 2020-06-30: 1000 mg via INTRAVENOUS

## 2020-06-30 MED ORDER — ACETAMINOPHEN 325 MG PO TABS: 325.0000 mg | ORAL_TABLET | ORAL | Status: DC | PRN

## 2020-06-30 SURGICAL SUPPLY — 93 items
2.0m K-Wire ×4 IMPLANT
3.2m Drill ×1 IMPLANT
APL PRP STRL LF DISP 70% ISPRP (MISCELLANEOUS) ×1
BIT DRILL 3.2 (BIT) ×2
BIT DRILL 3.2XCALB NS DISP (BIT) IMPLANT
BIT DRILL CALIBRATED 2.7 (BIT) ×1 IMPLANT
BIT DRL 3.2XCALB NS DISP (BIT) ×1
BLADE CLIPPER SENSICLIP SURGIC (BLADE) IMPLANT
BLADE SURG 15 STRL LF DISP TIS (BLADE) ×1 IMPLANT
BLADE SURG 15 STRL SS (BLADE) ×2
BNDG CMPR 9X4 STRL LF SNTH (GAUZE/BANDAGES/DRESSINGS) ×1
BNDG COHESIVE 4X5 TAN STRL (GAUZE/BANDAGES/DRESSINGS) IMPLANT
BNDG ELASTIC 4X5.8 VLCR STR LF (GAUZE/BANDAGES/DRESSINGS) ×1 IMPLANT
BNDG ESMARK 4X9 LF (GAUZE/BANDAGES/DRESSINGS) ×2 IMPLANT
CHLORAPREP W/TINT 26 (MISCELLANEOUS) ×2 IMPLANT
CLSR STERI-STRIP ANTIMIC 1/2X4 (GAUZE/BANDAGES/DRESSINGS) ×1 IMPLANT
CORD BIPOLAR FORCEPS 12FT (ELECTRODE) IMPLANT
COVER BACK TABLE 60X90IN (DRAPES) ×1 IMPLANT
COVER WAND RF STERILE (DRAPES) ×2 IMPLANT
CUFF TOURN SGL QUICK 18X4 (TOURNIQUET CUFF) IMPLANT
CUFF TOURN SGL QUICK 24 (TOURNIQUET CUFF)
CUFF TRNQT CYL 24X4X16.5-23 (TOURNIQUET CUFF) IMPLANT
DRAPE C-ARM 35X43 STRL (DRAPES) ×1 IMPLANT
DRAPE C-ARM 42X120 X-RAY (DRAPES) ×1 IMPLANT
DRAPE EXTREMITY T 121X128X90 (DISPOSABLE) ×2 IMPLANT
DRAPE IMP U-DRAPE 54X76 (DRAPES) ×2 IMPLANT
DRAPE U-SHAPE 47X51 STRL (DRAPES) ×2 IMPLANT
DRSG EMULSION OIL 3X3 NADH (GAUZE/BANDAGES/DRESSINGS) IMPLANT
DRSG MEPILEX BORDER 4X8 (GAUZE/BANDAGES/DRESSINGS) ×1 IMPLANT
ELECT REM PT RETURN 9FT ADLT (ELECTROSURGICAL) ×2
ELECTRODE REM PT RTRN 9FT ADLT (ELECTROSURGICAL) ×1 IMPLANT
GAUZE SPONGE 4X4 12PLY STRL (GAUZE/BANDAGES/DRESSINGS) ×2 IMPLANT
GAUZE XEROFORM 1X8 LF (GAUZE/BANDAGES/DRESSINGS) IMPLANT
GLOVE BIO SURGEON STRL SZ7.5 (GLOVE) ×4 IMPLANT
GLOVE BIOGEL PI IND STRL 8 (GLOVE) ×2 IMPLANT
GLOVE BIOGEL PI INDICATOR 8 (GLOVE) ×2
GOWN STRL REUS W/TWL XL LVL3 (GOWN DISPOSABLE) ×4 IMPLANT
K-WIRE 2X5 SS THRDED S3 (WIRE) ×8
KWIRE 2X5 SS THRDED S3 (WIRE) IMPLANT
NDL HYPO 25X1 1.5 SAFETY (NEEDLE) IMPLANT
NEEDLE HYPO 25X1 1.5 SAFETY (NEEDLE) IMPLANT
NS IRRIG 1000ML POUR BTL (IV SOLUTION) ×1 IMPLANT
PACK ARTHROSCOPY DSU (CUSTOM PROCEDURE TRAY) ×2 IMPLANT
PACK BASIN DAY SURGERY FS (CUSTOM PROCEDURE TRAY) ×2 IMPLANT
PACK SHOULDER (CUSTOM PROCEDURE TRAY) ×1 IMPLANT
PAD CAST 4YDX4 CTTN HI CHSV (CAST SUPPLIES) ×1 IMPLANT
PADDING CAST ABS 4INX4YD NS (CAST SUPPLIES)
PADDING CAST ABS COTTON 4X4 ST (CAST SUPPLIES) ×1 IMPLANT
PADDING CAST COTTON 4X4 STRL (CAST SUPPLIES) ×2
PASSER SUT SWANSON 36MM LOOP (INSTRUMENTS) IMPLANT
PEG LOCKING 3.2MMX44 (Peg) ×2 IMPLANT
PEG LOCKING 3.2X32 (Peg) ×1 IMPLANT
PEG LOCKING 3.2X34 (Screw) ×2 IMPLANT
PEG LOCKING 3.2X36 (Screw) ×2 IMPLANT
PEG LOCKING 3.2X38 (Screw) ×1 IMPLANT
PEG LOCKING 3.2X40 (Peg) ×1 IMPLANT
PEG LOCKING 3.2X48 (Peg) ×1 IMPLANT
PENCIL SMOKE EVACUATOR (MISCELLANEOUS) ×2 IMPLANT
PLATE HUMERUS LP PROX L 3H (Plate) ×1 IMPLANT
SCREW CORTICAL LOW PROF 3.5X34 (Screw) ×1 IMPLANT
SCREW LOW PROF TIS 3.5X28MM (Screw) ×2 IMPLANT
SLING ARM FOAM STRAP LRG (SOFTGOODS) IMPLANT
SLING ARM FOAM STRAP MED (SOFTGOODS) IMPLANT
SLING ARM FOAM STRAP XLG (SOFTGOODS) IMPLANT
SLING ARM IMMOBILIZER LRG (SOFTGOODS) IMPLANT
SLING ARM IMMOBILIZER MED (SOFTGOODS) IMPLANT
SPLINT FAST PLASTER 5X30 (CAST SUPPLIES)
SPLINT PLASTER CAST FAST 5X30 (CAST SUPPLIES) ×10 IMPLANT
SPONGE LAP 18X18 RF (DISPOSABLE) ×2 IMPLANT
STAPLER VISISTAT 35W (STAPLE) IMPLANT
STRIP CLOSURE SKIN 1/2X4 (GAUZE/BANDAGES/DRESSINGS) ×2 IMPLANT
SUCTION FRAZIER HANDLE 10FR (MISCELLANEOUS) ×2
SUCTION TUBE FRAZIER 10FR DISP (MISCELLANEOUS) ×1 IMPLANT
SUT ETHILON 3 0 PS 1 (SUTURE) ×1 IMPLANT
SUT FIBERWIRE #2 38 T-5 BLUE (SUTURE) ×4
SUT MNCRL AB 4-0 PS2 18 (SUTURE) ×1 IMPLANT
SUT MON AB 2-0 SH 27 (SUTURE) ×2
SUT MON AB 2-0 SH27 (SUTURE) IMPLANT
SUT PROLENE 3 0 PS 2 (SUTURE) IMPLANT
SUT VIC AB 0 CT1 27 (SUTURE) ×2
SUT VIC AB 0 CT1 27XBRD ANBCTR (SUTURE) ×1 IMPLANT
SUT VIC AB 0 SH 27 (SUTURE) ×1 IMPLANT
SUT VIC AB 2-0 SH 27 (SUTURE)
SUT VIC AB 2-0 SH 27XBRD (SUTURE) IMPLANT
SUT VIC AB 3-0 SH 27 (SUTURE)
SUT VIC AB 3-0 SH 27X BRD (SUTURE) IMPLANT
SUTURE FIBERWR #2 38 T-5 BLUE (SUTURE) IMPLANT
SYR BULB EAR ULCER 3OZ GRN STR (SYRINGE) ×2 IMPLANT
SYR CONTROL 10ML LL (SYRINGE) IMPLANT
TOWEL OR 17X26 10 PK STRL BLUE (TOWEL DISPOSABLE) ×2 IMPLANT
TUBE CONNECTING 12X1/4 (SUCTIONS) ×1 IMPLANT
UNDERPAD 30X36 HEAVY ABSORB (UNDERPADS AND DIAPERS) ×2 IMPLANT
YANKAUER SUCT BULB TIP NO VENT (SUCTIONS) IMPLANT

## 2020-06-30 NOTE — Transfer of Care (Signed)
Immediate Anesthesia Transfer of Care Note  Patient: Emily Braun  Procedure(s) Performed: Procedure(s) (LRB): OPEN REDUCTION INTERNAL FIXATION (ORIF) PROXIMAL HUMERUS FRACTURE (Left)  Patient Location: PACU  Anesthesia Type: General  Level of Consciousness: awake, alert  and oriented  Airway & Oxygen Therapy: Patient Spontanous Breathing and Patient connected to nasal cannula oxygen  Post-op Assessment: Report given to PACU RN and Post -op Vital signs reviewed and stable  Post vital signs: Reviewed and stable  Complications: No apparent anesthesia complicationsLast Vitals:  Vitals Value Taken Time  BP 124/65 06/30/20 1600  Temp    Pulse 92 06/30/20 1603  Resp 21 06/30/20 1603  SpO2 100 % 06/30/20 1603  Vitals shown include unvalidated device data.  Last Pain:  Vitals:   06/30/20 1118  TempSrc: Oral  PainSc: 5       Patients Stated Pain Goal: 5 (25/48/62 8241)  Complications: No complications documented.

## 2020-06-30 NOTE — Anesthesia Procedure Notes (Signed)
Anesthesia Regional Block: Interscalene brachial plexus block   Pre-Anesthetic Checklist: ,, timeout performed, Correct Patient, Correct Site, Correct Laterality, Correct Procedure, Correct Position, site marked, Risks and benefits discussed,  Surgical consent,  Pre-op evaluation,  At surgeon's request and post-op pain management  Laterality: Left and Upper  Prep: chloraprep       Needles:  Injection technique: Single-shot  Needle Type: Echogenic Stimulator Needle     Needle Length: 9cm  Needle Gauge: 20   Needle insertion depth: 1.5 cm   Additional Needles:   Procedures:,,,, ultrasound used (permanent image in chart),,,,  Narrative:  Start time: 06/30/2020 12:20 PM End time: 06/30/2020 12:30 PM Injection made incrementally with aspirations every 5 mL.  Performed by: Personally  Anesthesiologist: Lyn Hollingshead, MD

## 2020-06-30 NOTE — Progress Notes (Addendum)
Assisted Dr. Jillyn Hidden with left, interscalene block. Side rails up, monitors on throughout procedure. See vital signs in flow sheet. Tolerated Procedure well.

## 2020-06-30 NOTE — Anesthesia Postprocedure Evaluation (Signed)
Anesthesia Post Note  Patient: Emily Braun  Procedure(s) Performed: OPEN REDUCTION INTERNAL FIXATION (ORIF) PROXIMAL HUMERUS FRACTURE (Left Arm Upper)     Patient location during evaluation: PACU Anesthesia Type: General Level of consciousness: awake Pain management: pain level controlled Vital Signs Assessment: post-procedure vital signs reviewed and stable Respiratory status: spontaneous breathing Cardiovascular status: stable Postop Assessment: no apparent nausea or vomiting Anesthetic complications: no   No complications documented.  Last Vitals:  Vitals:   06/30/20 1315 06/30/20 1556  BP: (!) 116/59 127/66  Pulse: 78 92  Resp: 19 20  Temp:  36.6 C  SpO2: 95% 100%    Last Pain:  Vitals:   06/30/20 1556  TempSrc:   PainSc: 0-No pain                 Huston Foley

## 2020-06-30 NOTE — Interval H&P Note (Signed)
History and Physical Interval Note:  06/30/2020 12:11 PM  Emily Braun  has presented today for surgery, with the diagnosis of FRACTURE OF NECK OF HUMERUS LEFT SHOULDER.  The various methods of treatment have been discussed with the patient and family. After consideration of risks, benefits and other options for treatment, the patient has consented to  Procedure(s): OPEN REDUCTION INTERNAL FIXATION (ORIF) PROXIMAL HUMERUS FRACTURE (Left) as a surgical intervention.  The patient's history has been reviewed, patient examined, no change in status, stable for surgery.  I have reviewed the patient's chart and labs.  Questions were answered to the patient's satisfaction.     [name]

## 2020-06-30 NOTE — Discharge Instructions (Signed)
Keep arm elevated with ice as much as possible to reduce pain and swelling. If needed, you may increase pain medication for the first few days post op to 2 tablets every 4 hours.  Stop as needed pain medication as soon as you are able.  Diet: As you were doing prior to hospitalization   Shower:  Leave the dressings in place until follow up. Cover the dressings with cling film (such as "press 'n seal) to keep the dressings dry.  Dressing:  Please leave the dressings in place until follow up.   Activity:  Increase activity slowly as tolerated, but follow the weight bearing instructions below.  The rules on driving is that you can not be taking narcotics while you drive, and you must feel in control of the vehicle.    Weight Bearing:  Non weight bearing affected wrist.  Sling for comfort.    To prevent constipation: you may use a stool softener such as -  Colace (over the counter) 100 mg by mouth twice a day  Drink plenty of fluids (prune juice may be helpful) and high fiber foods Miralax (over the counter) for constipation as needed.    Itching:  If you experience itching with your medications, try taking only a single pain pill, or even half a pain pill at a time.  You can also use benadryl over the counter for itching or also to help with sleep.   Precautions:  If you experience chest pain or shortness of breath - call 911 immediately for transfer to the hospital emergency department!!  If you develop a fever greater that 101 F, purulent drainage from wound, increased redness or drainage from wound, or calf pain -- Call the office at [phone]                                         Follow- Up Appointment:  Please call for an appointment to be seen in 2 weeks [place] - [phone]  NO ADVIL, ALEVE, MOTRIN, IBUPROFEN [id number] PM TONIGHT  Information for Discharge Teaching: EXPAREL (bupivacaine liposome injectable suspension)   Your surgeon gave you EXPAREL(bupivacaine) in  your surgical incision to help control your pain after surgery.   EXPAREL is a local anesthetic that provides pain relief by numbing the tissue around the surgical site.  EXPAREL is designed to release pain medication over time and can control pain for up to 72 hours.  Depending on how you respond to EXPAREL, you may require less pain medication during your recovery.  Possible side effects:  Temporary loss of sensation or ability to move in the area where bupivacaine was injected.  Nausea, vomiting, constipation  Rarely, numbness and tingling in your mouth or lips, lightheadedness, or anxiety may occur.  Call your doctor right away if you think you may be experiencing any of these sensations, or if you have other questions regarding possible side effects.  Follow all other discharge instructions given to you by your surgeon or nurse. Eat a healthy diet and drink plenty of water or other fluids.  If you return to the hospital for any reason within 96 hours following the administration of EXPAREL, please inform your health care providers.     Post Anesthesia Home Care Instructions  Activity: Get plenty of rest for the remainder of the day. A responsible individual must stay with you for 24 hours following the procedure.  For the next 24 hours, DO NOT: -Drive a car -Paediatric nurse -Drink alcoholic beverages -Take any medication unless instructed by your physician -Make any legal decisions or sign important papers.  Meals: Start with liquid foods such as gelatin or soup. Progress to regular foods as tolerated. Avoid greasy, spicy, heavy foods. If nausea and/or vomiting occur, drink only clear liquids until the nausea and/or vomiting subsides. Call your physician if vomiting continues.  Special Instructions/Symptoms: Your throat may feel dry or sore from the anesthesia or the breathing tube placed in your throat during surgery. If this causes discomfort, gargle with warm salt  water. The discomfort should disappear within 24 hours.  If you had a scopolamine patch placed behind your ear for the management of post- operative nausea and/or vomiting:  1. The medication in the patch is effective for 72 hours, after which it should be removed.  Wrap patch in a tissue and discard in the trash. Wash hands thoroughly with soap and water. 2. You may remove the patch earlier than 72 hours if you experience unpleasant side effects which may include dry mouth, dizziness or visual disturbances. 3. Avoid touching the patch. Wash your hands with soap and water after contact with the patch.    Regional Anesthesia Blocks  1. Numbness or the inability to move the "blocked" extremity may last from 3-48 hours after placement. The length of time depends on the medication injected and your individual response to the medication. If the numbness is not going away after 48 hours, call your surgeon.  2. The extremity that is blocked will need to be protected until the numbness is gone and the  Strength has returned. Because you cannot feel it, you will need to take extra care to avoid injury. Because it may be weak, you may have difficulty moving it or using it. You may not know what position it is in without looking at it while the block is in effect.  3. For blocks in the legs and feet, returning to weight bearing and walking needs to be done carefully. You will need to wait until the numbness is entirely gone and the strength has returned. You should be able to move your leg and foot normally before you try and bear weight or walk. You will need someone to be with you when you first try to ensure you do not fall and possibly risk injury.  4. Bruising and tenderness at the needle site are common side effects and will resolve in a few days.  5. Persistent numbness or new problems with movement should be communicated to the surgeon or the [place] [phone] [place] [phone]).

## 2020-06-30 NOTE — Anesthesia Preprocedure Evaluation (Signed)
Anesthesia Evaluation  Patient identified by MRN, date of birth, ID band Patient awake    Reviewed: Allergy & Precautions, NPO status , Patient's Chart, lab work & pertinent test results  Airway Mallampati: I       Dental no notable dental hx.    Pulmonary neg pulmonary ROS,    Pulmonary exam normal        Cardiovascular hypertension, Pt. on medications Normal cardiovascular exam     Neuro/Psych PSYCHIATRIC DISORDERS Anxiety Depression    GI/Hepatic GERD  Medicated and Controlled,  Endo/Other  diabetes, Type 2, Oral Hypoglycemic Agents, Insulin Dependent  Renal/GU   negative genitourinary   Musculoskeletal   Abdominal (+) + obese,   Peds  Hematology   Anesthesia Other Findings   Reproductive/Obstetrics                             Anesthesia Physical Anesthesia Plan  ASA: II  Anesthesia Plan: General   Post-op Pain Management:  Regional for Post-op pain   Induction:   PONV Risk Score and Plan: 3 and Ondansetron, Dexamethasone and Midazolam  Airway Management Planned: Oral ETT  Additional Equipment: None  Intra-op Plan:   Post-operative Plan: Extubation in OR  Informed Consent: I have reviewed the patients History and Physical, chart, labs and discussed the procedure including the risks, benefits and alternatives for the proposed anesthesia with the patient or authorized representative who has indicated his/her understanding and acceptance.     Dental advisory given  Plan Discussed with: CRNA  Anesthesia Plan Comments:         Anesthesia Quick Evaluation

## 2020-06-30 NOTE — Anesthesia Procedure Notes (Signed)
Procedure Name: Intubation Date/Time: 06/30/2020 1:30 PM Performed by: Mechele Claude, CRNA Pre-anesthesia Checklist: Patient identified, Emergency Drugs available, Suction available and Patient being monitored Patient Re-evaluated:Patient Re-evaluated prior to induction Oxygen Delivery Method: Circle system utilized Preoxygenation: Pre-oxygenation with 100% oxygen Induction Type: IV induction Ventilation: Mask ventilation without difficulty Laryngoscope Size: Mac and 3 Grade View: Grade II Tube type: Oral Tube size: 7.0 mm Number of attempts: 1 Airway Equipment and Method: Stylet and Oral airway Placement Confirmation: ETT inserted through vocal cords under direct vision,  positive ETCO2 and breath sounds checked- equal and bilateral Secured at: 20 cm Tube secured with: Tape Dental Injury: Teeth and Oropharynx as per pre-operative assessment

## 2020-07-01 ENCOUNTER — Encounter (HOSPITAL_BASED_OUTPATIENT_CLINIC_OR_DEPARTMENT_OTHER): Payer: Self-pay | Admitting: Orthopedic Surgery

## 2020-07-01 LAB — GLUCOSE, CAPILLARY: Glucose-Capillary: 144 mg/dL — ABNORMAL HIGH (ref 70–99)

## 2020-07-01 NOTE — Op Note (Addendum)
06/30/2020  7:35 AM  PATIENT:  Emily Braun    PRE-OPERATIVE DIAGNOSIS:  FRACTURE OF NECK OF HUMERUS LEFT SHOULDER  POST-OPERATIVE DIAGNOSIS:  Same  PROCEDURE:  OPEN REDUCTION INTERNAL FIXATION (ORIF) PROXIMAL HUMERUS FRACTURE  SURGEON:  [name], MD  PHYSICIAN ASSISTANT: Margy Clarks, PA-C, he was present and scrubbed throughout the case, critical for completion in a timely fashion, and for retraction, instrumentation, and closure.   ANESTHESIA:   General  PREOPERATIVE INDICATIONS:  [name] is a  44 y.o. female with a diagnosis of FRACTURE OF NECK OF HUMERUS LEFT SHOULDER who elected for surgical management.    The risks benefits and alternatives were discussed with the patient including but not limited to the risks of nonoperative treatment, versus surgical intervention including infection, bleeding, nerve injury, malunion, nonunion, the need for revision surgery, hardware prominence, hardware failure, the need for hardware removal, blood clots, cardiopulmonary complications, conversion to arthroplasty, morbidity, mortality, among others, and they were willing to proceed.  Predicted outcome is good, although there will be at least a six to nine month expected recovery.    OPERATIVE IMPLANTS: Biomet S3 locking plate  OPERATIVE FINDINGS: Displaced proximal humerus fracture  OPERATIVE PROCEDURE: The patient was brought to the operating room and placed in the supine position. General anesthesia was administered. IV antibiotics were given. She was placed in the beach chair position. All bony prominences were padded. The upper extremity was prepped and draped in usual sterile fashion. Deltopectoral incision was performed.  I exposed the fracture site, and placed deep retractors. I did not tenotomize the biceps tendon. This was left in place. I elevated a small portion of the deltoid off of the shaft, in order to gain access for the plate. I placed supraspinatus and  subscapularis stitches, and then reduced the head onto the shaft. This was maintained in satisfactory position.  I applied the plate and secured it into the sliding hole first. I confirmed position of the reduction and the plate with C-arm, and I placed a total of 2 guidewires into the appropriate position in the head. I was satisfied that the plate was distal appropriately, and then secured the plate proximally with smooth pegs, taking care to prevent penetration into the arch articular surface, using C-arm, as well as manual feel using a hand drill.  I then secured the plate distally using another cortical screw. Once complete fixation and reduction of been achieved, took final C-arm pictures 4 views showed good alignment.  The lesser and greator tuberosities were fractured pieces so I used the stitches in the subscap to repair this to the plate as for further stabilization  I also repaird the supraspinatus with a stitch through the plate.   , and irrigated the wounds copiously, and repaired the deltopectoral interval with Vicryl followed by Vicryl for the subcutaneous tissue with Monocryl and Steri-Strips for the skin. She was placed in a sling. She had a preoperative regional block as well. She tolerated the procedure well with no complications.   POST OPERATIVE PLAN: Sling full time, DVT px: Ambulation and foot pumps

## 2020-07-16 ENCOUNTER — Other Ambulatory Visit: Payer: 59

## 2020-07-27 ENCOUNTER — Other Ambulatory Visit: Payer: Self-pay | Admitting: Family Medicine

## 2020-07-30 ENCOUNTER — Ambulatory Visit: Payer: 59 | Attending: Internal Medicine

## 2020-07-30 DIAGNOSIS — Z23 Encounter for immunization: Secondary | ICD-10-CM

## 2020-07-30 NOTE — Progress Notes (Signed)
   Covid-19 Vaccination Clinic  Name:  MARLINDA MIRANDA    MRN: [id number] DOB: 09-29-1975  07/30/2020  Ms. Debruler was observed post Covid-19 immunization for 15 minutes without incident. She was provided with Vaccine Information Sheet and instruction to access the V-Safe system.   Ms. Swor was instructed to call 911 with any severe reactions post vaccine: Marland Kitchen Difficulty breathing  . Swelling of face and throat  . A fast heartbeat  . A bad rash all over body  . Dizziness and weakness   Immunizations Administered    Name Date Dose VIS Date Route   Pfizer COVID-19 Vaccine 07/30/2020  2:19 PM 0.3 mL 06/17/2020 Intramuscular   Manufacturer: [place]   Lot: [id number]   NDC: [address]-6

## 2020-08-01 ENCOUNTER — Other Ambulatory Visit: Payer: 59

## 2020-08-17 ENCOUNTER — Ambulatory Visit
Admission: RE | Admit: 2020-08-17 | Discharge: 2020-08-17 | Disposition: A | Discharge status: Home / Self Care | Payer: 59 | Source: Ambulatory Visit | Attending: Family Medicine | Admitting: Family Medicine

## 2020-08-19 ENCOUNTER — Other Ambulatory Visit: Payer: Self-pay | Admitting: Family Medicine

## 2020-08-19 DIAGNOSIS — M5412 Radiculopathy, cervical region: Secondary | ICD-10-CM

## 2020-09-27 ENCOUNTER — Other Ambulatory Visit: Payer: Self-pay | Admitting: Family Medicine

## 2020-10-14 ENCOUNTER — Telehealth: Payer: Self-pay | Admitting: Family Medicine

## 2020-10-14 DIAGNOSIS — Z79899 Other long term (current) drug therapy: Secondary | ICD-10-CM

## 2020-10-14 DIAGNOSIS — I10 Essential (primary) hypertension: Secondary | ICD-10-CM

## 2020-10-14 DIAGNOSIS — E7849 Other hyperlipidemia: Secondary | ICD-10-CM

## 2020-10-14 DIAGNOSIS — E119 Type 2 diabetes mellitus without complications: Secondary | ICD-10-CM

## 2020-10-14 NOTE — Telephone Encounter (Signed)
CMP, lipid, A1c, urine ACR

## 2020-10-14 NOTE — Telephone Encounter (Signed)
Patient has six month follow up on 3/11 and needing labs done

## 2020-10-14 NOTE — Telephone Encounter (Signed)
Last labs completed 04/24/20 BMET, Hepatic, Lipid and A1C. Please advise.

## 2020-10-15 NOTE — Telephone Encounter (Signed)
Lab orders placed and pt is aware 

## 2020-10-25 ENCOUNTER — Other Ambulatory Visit: Payer: Self-pay | Admitting: Family Medicine

## 2020-10-30 ENCOUNTER — Ambulatory Visit (HOSPITAL_COMMUNITY): Payer: 59

## 2020-10-31 ENCOUNTER — Other Ambulatory Visit: Payer: Self-pay | Admitting: Family Medicine

## 2020-10-31 LAB — COMPREHENSIVE METABOLIC PANEL
ALT: 21 IU/L (ref 0–32)
AST: 24 IU/L (ref 0–40)
Albumin/Globulin Ratio: 1.8 (ref 1.2–2.2)
Albumin: 4.4 g/dL (ref 3.8–4.8)
Alkaline Phosphatase: 76 IU/L (ref 44–121)
BUN/Creatinine Ratio: 19 (ref 9–23)
BUN: 13 mg/dL (ref 6–24)
Bilirubin Total: 0.5 mg/dL (ref 0.0–1.2)
CO2: 23 mmol/L (ref 20–29)
Calcium: 9.6 mg/dL (ref 8.7–10.2)
Chloride: 100 mmol/L (ref 96–106)
Creatinine, Ser: 0.67 mg/dL (ref 0.57–1.00)
Globulin, Total: 2.5 g/dL (ref 1.5–4.5)
Glucose: 61 mg/dL — ABNORMAL LOW (ref 65–99)
Potassium: 4.1 mmol/L (ref 3.5–5.2)
Sodium: 138 mmol/L (ref 134–144)
Total Protein: 6.9 g/dL (ref 6.0–8.5)
eGFR: 111 mL/min/{1.73_m2} (ref >59)

## 2020-10-31 LAB — HEMOGLOBIN A1C
Est. average glucose Bld gHb Est-mCnc: 120 mg/dL
Hgb A1c MFr Bld: 5.8 % — ABNORMAL HIGH (ref 4.8–5.6)

## 2020-10-31 LAB — MICROALBUMIN / CREATININE URINE RATIO
Creatinine, Urine: 114.3 mg/dL
Microalb/Creat Ratio: 5 mg/g creat (ref 0–29)
Microalbumin, Urine: 5.6 ug/mL

## 2020-10-31 LAB — LIPID PANEL
Chol/HDL Ratio: 1.4 ratio (ref 0.0–4.4)
Cholesterol, Total: 103 mg/dL (ref 100–199)
HDL: 74 mg/dL (ref >39)
LDL Chol Calc (NIH): 20 mg/dL (ref 0–99)
Triglycerides: 20 mg/dL (ref 0–149)
VLDL Cholesterol Cal: 9 mg/dL (ref 5–40)

## 2020-11-06 ENCOUNTER — Ambulatory Visit: Payer: 59 | Admitting: Family Medicine

## 2020-11-06 ENCOUNTER — Other Ambulatory Visit: Payer: Self-pay

## 2020-11-06 ENCOUNTER — Ambulatory Visit (HOSPITAL_COMMUNITY)
Admission: RE | Admit: 2020-11-06 | Discharge: 2020-11-06 | Disposition: A | Discharge status: Home / Self Care | Payer: 59 | Source: Ambulatory Visit | Attending: Family Medicine | Admitting: Family Medicine

## 2020-11-06 VITALS — BP 122/84 | Temp 97.4°F | Ht 62.0 in | Wt 189.4 lb

## 2020-11-06 DIAGNOSIS — Z1231 Encounter for screening mammogram for malignant neoplasm of breast: Secondary | ICD-10-CM | POA: Insufficient documentation

## 2020-11-06 DIAGNOSIS — Z79899 Other long term (current) drug therapy: Secondary | ICD-10-CM | POA: Diagnosis not present

## 2020-11-06 DIAGNOSIS — E7849 Other hyperlipidemia: Secondary | ICD-10-CM

## 2020-11-06 DIAGNOSIS — I10 Essential (primary) hypertension: Secondary | ICD-10-CM

## 2020-11-06 DIAGNOSIS — E119 Type 2 diabetes mellitus without complications: Secondary | ICD-10-CM | POA: Diagnosis not present

## 2020-11-06 DIAGNOSIS — Z794 Long term (current) use of insulin: Secondary | ICD-10-CM

## 2020-11-06 MED ORDER — TRULICITY 3 MG/0.5ML ~~LOC~~ SOAJ: 3.0000 mg | SUBCUTANEOUS | 5 refills | Status: DC

## 2020-11-06 MED ORDER — PRAVASTATIN SODIUM 20 MG PO TABS
ORAL_TABLET | ORAL | 1 refills | Status: DC
Start: 2020-11-06 — End: 2021-08-10

## 2020-11-06 MED ORDER — LISINOPRIL 10 MG PO TABS
10.0000 mg | ORAL_TABLET | Freq: Every day | ORAL | 1 refills | Status: DC
Start: 2020-11-06 — End: 2021-05-28

## 2020-11-06 MED ORDER — PANTOPRAZOLE SODIUM 40 MG PO TBEC
DELAYED_RELEASE_TABLET | ORAL | 1 refills | Status: DC
Start: 2020-11-06 — End: 2021-08-10

## 2020-11-06 NOTE — Progress Notes (Signed)
Subjective:    Patient ID: Emily Braun, female    DOB: 1975-12-25, 45 y.o.   MRN: [id number]  Diabetes She presents for her follow-up diabetic visit. She has type 2 diabetes mellitus. Pertinent negatives for hypoglycemia include no confusion or dizziness. Pertinent negatives for diabetes include no chest pain, no fatigue, no polydipsia, no polyphagia and no weakness. Risk factors for coronary artery disease include dyslipidemia. Current diabetic treatment includes insulin injections.  Essential hypertension, benign - Plan: Comprehensive metabolic panel  Type 2 diabetes mellitus without complication, with long-term current use of insulin (HCC) - Plan: Comprehensive metabolic panel, Hemoglobin A1c  Other hyperlipidemia - Plan: Lipid panel  High risk medication use - Plan: Comprehensive metabolic panel   Discuss recent labs Results for orders placed or performed in visit on 10/14/20  Comprehensive Metabolic Panel (CMET)  Result Value Ref Range   Glucose 61 (L) 65 - 99 mg/dL   BUN 13 6 - 24 mg/dL   Creatinine, Ser 0.67 0.57 - 1.00 mg/dL   eGFR 111 >59 mL/min/1.73   BUN/Creatinine Ratio 19 9 - 23   Sodium 138 134 - 144 mmol/L   Potassium 4.1 3.5 - 5.2 mmol/L   Chloride 100 96 - 106 mmol/L   CO2 23 20 - 29 mmol/L   Calcium 9.6 8.7 - 10.2 mg/dL   Total Protein 6.9 6.0 - 8.5 g/dL   Albumin 4.4 3.8 - 4.8 g/dL   Globulin, Total 2.5 1.5 - 4.5 g/dL   Albumin/Globulin Ratio 1.8 1.2 - 2.2   Bilirubin Total 0.5 0.0 - 1.2 mg/dL   Alkaline Phosphatase 76 44 - 121 IU/L   AST 24 0 - 40 IU/L   ALT 21 0 - 32 IU/L  Lipid Profile  Result Value Ref Range   Cholesterol, Total 103 100 - 199 mg/dL   Triglycerides 20 0 - 149 mg/dL   HDL 74 >39 mg/dL   VLDL Cholesterol Cal 9 5 - 40 mg/dL   LDL Chol Calc (NIH) 20 0 - 99 mg/dL   Chol/HDL Ratio 1.4 0.0 - 4.4 ratio  Hemoglobin A1c  Result Value Ref Range   Hgb A1c MFr Bld 5.8 (H) 4.8 - 5.6 %   Est. average glucose Bld gHb Est-mCnc 120 mg/dL   Urine Microalbumin w/creat. ratio  Result Value Ref Range   Creatinine, Urine 114.3 Not Estab. mg/dL   Microalbumin, Urine 5.6 Not Estab. ug/mL   Microalb/Creat Ratio 5 0 - 29 mg/g creat   So reque  Review of Systems  Constitutional: Negative for activity change, appetite change and fatigue.  HENT: Negative for congestion and rhinorrhea.   Respiratory: Negative for cough and shortness of breath.   Cardiovascular: Negative for chest pain and leg swelling.  Gastrointestinal: Negative for abdominal pain and diarrhea.  Endocrine: Negative for polydipsia and polyphagia.  Skin: Negative for color change.  Neurological: Negative for dizziness and weakness.  Psychiatric/Behavioral: Negative for behavioral problems and confusion.  She does have decreased range of motion of the left shoulder from recent fracture with pinning     Objective:   Physical Exam Vitals reviewed.  Constitutional:      General: She is not in acute distress. HENT:     Head: Normocephalic and atraumatic.  Eyes:     General:        Right eye: No discharge.        Left eye: No discharge.  Neck:     Trachea: No tracheal deviation.  Cardiovascular:  Rate and Rhythm: Normal rate and regular rhythm.     Heart sounds: Normal heart sounds. No murmur heard.   Pulmonary:     Effort: Pulmonary effort is normal. No respiratory distress.     Breath sounds: Normal breath sounds.  Lymphadenopathy:     Cervical: No cervical adenopathy.  Skin:    General: Skin is warm and dry.  Neurological:     Mental Status: She is alert.     Coordination: Coordination normal.  Psychiatric:        Behavior: Behavior normal.    Decreased range of motion left shoulder going through therapy currently Diabetic foot exam has dry skin calluses and fungal toenails       Assessment & Plan:  Patient states she will set herself up with podiatry to have her toenails trimmed  Lotion feet on a daily basis monitor for any signs of  infection  Diabetes very good control but given her weight as well as long-term health I would recommend bumping up Trulicity to 3 mg if she has significant side effects she is to let us know she will taper down on her insulin to keep morning sugars in the range of 100 to 130 she will send [place] updates on a regular basis  Blood pressure is doing well but if her weight does come down she may be able to reduce her blood pressure medicine  Cholesterol doing well medication doing well check labs again in 6 months continue medication she only tolerates couple days a week  Follow-up sooner if any problems send [place] updates on a regular basis

## 2020-11-23 LAB — HM DIABETES EYE EXAM

## 2020-11-27 ENCOUNTER — Other Ambulatory Visit: Payer: Self-pay | Admitting: Family Medicine

## 2020-12-16 ENCOUNTER — Other Ambulatory Visit: Payer: Self-pay | Admitting: Family Medicine

## 2020-12-28 ENCOUNTER — Other Ambulatory Visit: Payer: Self-pay | Admitting: Family Medicine

## 2021-01-27 ENCOUNTER — Other Ambulatory Visit: Payer: Self-pay

## 2021-01-27 ENCOUNTER — Ambulatory Visit
Admission: RE | Admit: 2021-01-27 | Discharge: 2021-01-27 | Disposition: A | Discharge status: Home / Self Care | Payer: 59 | Source: Ambulatory Visit | Attending: Family Medicine | Admitting: Family Medicine

## 2021-01-27 VITALS — BP 120/67 | HR 80 | Temp 98.7°F | Resp 17

## 2021-01-27 DIAGNOSIS — J0140 Acute pansinusitis, unspecified: Secondary | ICD-10-CM | POA: Diagnosis not present

## 2021-01-27 MED ORDER — FLUCONAZOLE 150 MG PO TABS: ORAL_TABLET | ORAL | 0 refills | Status: DC

## 2021-01-27 MED ORDER — AMOXICILLIN-POT CLAVULANATE 875-125 MG PO TABS: 1.0000 | ORAL_TABLET | Freq: Two times a day (BID) | ORAL | 0 refills | Status: AC

## 2021-01-27 NOTE — ED Triage Notes (Signed)
Nasal drainage , cough for over a week.  Neg at home covid test.

## 2021-01-27 NOTE — Discharge Instructions (Signed)
I have sent in Augmentin for you to take twice a day for 7 days.  I have sent in fluconazole in case of yeast. Take one tablet at the onset of symptoms. If symptoms are still present in 3 days, take the second tablet.   Follow up with this office or with primary care if symptoms are persisting.  Follow up in the ER for high fever, trouble swallowing, trouble breathing, other concerning symptoms.  

## 2021-01-31 NOTE — ED Provider Notes (Signed)
[facility]    CSN: [id number] Arrival date & time: 01/27/21  1735      History   Chief Complaint Chief Complaint  Patient presents with  . Nasal Congestion    HPI Emily Braun is a 45 y.o. female.   Reports nasal congestion and post nasal drip for over a week. Reports sinus pressure over the last 2-3 days. Has been taking OTC cough and cold with little relief. Reports negative Covid test at home. Denies SOB, chest pain, abdominal pain, nausea, vomiting, diarrhea, rash, fever, other symptoms.  ROS per HPI  The history is provided by the patient.    Past Medical History:  Diagnosis Date  . Anxiety   . Arthritis   . Carpal tunnel syndrome, left   . Depression   . Fracture of humerus, proximal, left, closed   . GERD (gastroesophageal reflux disease)   . Hyperlipidemia   . Hypertension    followed by pcp   (06-25-2020 pt stated never had stress test)  . IBS (irritable bowel syndrome)   . Mixed stress and urge urinary incontinence    urologist--- dr Matilde Sprang  . Seasonal allergies   . Type 2 diabetes mellitus treated with insulin ([place])    followed by pcp---  (06-25-2020 pt stated she does not check her blood sugar at home)  . Wears glasses     Patient Active Problem List   Diagnosis Date Noted  . Cervical radiculopathy 01/02/2020  . Vulvar itching 11/15/2019  . Right carpal tunnel syndrome 10/18/2019  . Left carpal tunnel syndrome 10/18/2019  . Numbness of right hand 03/28/2019  . Bilateral hand pain 03/28/2019  . Bilateral carpal tunnel syndrome 03/22/2019  . Hyperlipidemia 03/16/2018  . DM (diabetes mellitus), type 2 ([place]) 08/28/2015  . Prediabetes 10/06/2014  . GERD (gastroesophageal reflux disease) 08/25/2014  . Morbid obesity ([place]) 08/14/2013  . Essential hypertension, benign 07/03/2013  . Colitis 12/20/2012    Past Surgical History:  Procedure Laterality Date  . COLONOSCOPY WITH PROPOFOL  02/22/2013  dr [name]  .  ESOPHAGOGASTRODUODENOSCOPY (EGD) WITH PROPOFOL  08-08-2016  dr Loletha Carrow  . LAPAROSCOPIC CHOLECYSTECTOMY  09-10-2007  @[facility]   . MAXILLARY ANTROSTOMY Left 03/10/2014   Procedure: LEFT MAXILLARY ANTROSTOMY;  Surgeon: Ascencion Dike, MD;  Location: [facility];  Service: ENT;  Laterality: Left;  . ORIF HUMERUS FRACTURE Left 06/30/2020   Procedure: OPEN REDUCTION INTERNAL FIXATION (ORIF) PROXIMAL HUMERUS FRACTURE;  Surgeon: [name], MD;  Location: [place];  Service: Orthopedics;  Laterality: Left;  . ORIF PATELLA FRACTURE Left 2011  . WISDOM TOOTH EXTRACTION      OB History   No obstetric history on file.      Home Medications    Prior to Admission medications   Medication Sig Start Date End Date Taking? Authorizing Provider  amoxicillin-clavulanate (AUGMENTIN) 875-125 MG tablet Take 1 tablet by mouth 2 (two) times daily for 7 days. 01/27/21 02/03/21 Yes Faustino Congress, NP  fluconazole (DIFLUCAN) 150 MG tablet Take one tablet at the onset of symptoms. If symptoms are still present 3 days later, take the second tablet. 01/27/21  Yes Faustino Congress, NP  Calcium Carbonate-Vitamin D (CALCIUM + D PO) Take 600 mg by mouth daily.    [provider]  chlorzoxazone (PARAFON FORTE DSC) 500 MG tablet One bid prn home use only 06/17/20   Kathyrn Drown, MD  citalopram (CELEXA) 20 MG tablet Take 1 tablet by mouth once daily 12/17/20  Luking, Scott A, MD  Dulaglutide (TRULICITY) 3 PY/0.9XI SOPN Inject 3 mg as directed once a week. 11/06/20   Kathyrn Drown, MD  fluticasone (FLONASE) 50 MCG/ACT nasal spray Use 2 spray(s) in each nostril once daily 12/28/20   Luking, Elayne Snare, MD  insulin glargine (LANTUS SOLOSTAR) 100 UNIT/ML Solostar Pen Inject 35-40 units nightly; may titrate up to 55 units max Patient taking differently: Inject 28 Units into the skin at bedtime. Inject 35-40 units nightly; may titrate up to 55 units max 04/24/20   Luking, Scott A, MD  lisinopril  (ZESTRIL) 10 MG tablet Take 1 tablet (10 mg total) by mouth daily. 11/06/20   Kathyrn Drown, MD  loratadine (CLARITIN) 10 MG tablet Take 10 mg by mouth at bedtime.     [provider]  oxybutynin (DITROPAN) 5 MG tablet Take 5 mg by mouth 2 (two) times daily. 12/20/19   [provider]  oxyCODONE (ROXICODONE) 5 MG immediate release tablet Take 1 tablet (5 mg total) by mouth every 8 (eight) hours as needed. Patient not taking: Reported on 11/06/2020 06/30/20 06/30/21  Rachael Fee, PA-C  pantoprazole (PROTONIX) 40 MG tablet TAKE 1 TABLET BY MOUTH ONCE DAILY FOR ACID REFLUX 11/06/20   Kathyrn Drown, MD  pravastatin (PRAVACHOL) 20 MG tablet TAKE 1 TABLET BY MOUTH ONCE DAILY ON  MONDAY  AND  FRIDAY 11/06/20   Kathyrn Drown, MD  triamcinolone cream (KENALOG) 0.1 % Apply 1 application topically 2 (two) times daily. Patient taking differently: Apply 1 application topically 2 (two) times daily as needed.  04/24/20   Kathyrn Drown, MD    Family History Family History  Problem Relation Age of Onset  . Diabetes Father   . Hypertension Father   . Diabetes Maternal Grandmother   . Diabetes Paternal Grandmother   . Hypertension Mother   . Cancer - Other Brother        testicular  . Esophageal cancer Neg Hx     Social History Social History   Tobacco Use  . Smoking status: Never Smoker  . Smokeless tobacco: Never Used  Vaping Use  . Vaping Use: Never used  Substance Use Topics  . Alcohol use: Not Currently    Comment: socially  . Drug use: Never     Allergies   Bydureon [exenatide], Metformin and related, and Tradjenta [linagliptin]   Review of Systems Review of Systems   Physical Exam Triage Vital Signs ED Triage Vitals  Enc Vitals Group     BP 01/27/21 1814 120/67     Pulse Rate 01/27/21 1814 80     Resp 01/27/21 1814 17     Temp 01/27/21 1814 98.7 F (37.1 C)     Temp Source 01/27/21 1814 Oral     SpO2 01/27/21 1814 96 %     Weight --      Height  --      Head Circumference --      Peak Flow --      Pain Score 01/27/21 1812 0     Pain Loc --      Pain Edu? --      Excl. in [place]? --    No data found.  Updated Vital Signs BP 120/67 (BP Location: Right Arm)   Pulse 80   Temp 98.7 F (37.1 C) (Oral)   Resp 17   SpO2 96%   Visual Acuity Right Eye Distance:   Left Eye Distance:   Bilateral Distance:  Right Eye Near:   Left Eye Near:    Bilateral Near:     Physical Exam Vitals and nursing note reviewed.  Constitutional:      General: She is not in acute distress.    Appearance: She is well-developed. She is ill-appearing.  HENT:     Head: Normocephalic and atraumatic.     Right Ear: Tympanic membrane, ear canal and external ear normal.     Left Ear: Tympanic membrane, ear canal and external ear normal.     Nose: Congestion present.     Comments: Frontal and maxillary sinus tenderness     Mouth/Throat:     Mouth: Mucous membranes are moist.     Pharynx: Oropharynx is clear.  Eyes:     Extraocular Movements: Extraocular movements intact.     Conjunctiva/sclera: Conjunctivae normal.     Pupils: Pupils are equal, round, and reactive to light.  Cardiovascular:     Rate and Rhythm: Normal rate and regular rhythm.     Heart sounds: Normal heart sounds. No murmur heard.   Pulmonary:     Effort: Pulmonary effort is normal. No respiratory distress.     Breath sounds: No stridor. No wheezing, rhonchi or rales.  Chest:     Chest wall: No tenderness.  Musculoskeletal:        General: Normal range of motion.     Cervical back: Normal range of motion and neck supple.  Lymphadenopathy:     Cervical: Cervical adenopathy present.  Skin:    General: Skin is warm and dry.     Capillary Refill: Capillary refill takes less than 2 seconds.  Neurological:     General: No focal deficit present.     Mental Status: She is alert and oriented to person, place, and time.  Psychiatric:        Mood and Affect: Mood normal.         Behavior: Behavior normal.        Thought Content: Thought content normal.      UC Treatments / Results  Labs (all labs ordered are listed, but only abnormal results are displayed) Labs Reviewed - No data to display  EKG   Radiology No results found.  Procedures Procedures (including critical care time)  Medications Ordered in UC Medications - No data to display  Initial Impression / Assessment and Plan / UC Course  I have reviewed the triage vital signs and the nursing notes.  Pertinent labs & imaging results that were available during my care of the patient were reviewed by me and considered in my medical decision making (see chart for details).    Acute Pansinusitis  Prescribed Augmentin 875mg  BID x 7 days Prescribed fluconazole in case of yeast Take as directed and to completion Follow up with this office or with primary care if symptoms are persisting.  Follow up in the ER for high fever, trouble swallowing, trouble breathing, other concerning symptoms.   Final Clinical Impressions(s) / UC Diagnoses   Final diagnoses:  Acute non-recurrent pansinusitis     Discharge Instructions     I have sent in Augmentin for you to take twice a day for 7 days.  I have sent in fluconazole in case of yeast. Take one tablet at the onset of symptoms. If symptoms are still present in 3 days, take the second tablet.   Follow up with this office or with primary care if symptoms are persisting.  Follow up in the ER for high  fever, trouble swallowing, trouble breathing, other concerning symptoms.     ED Prescriptions    Medication Sig Dispense Auth. Provider   amoxicillin-clavulanate (AUGMENTIN) 875-125 MG tablet Take 1 tablet by mouth 2 (two) times daily for 7 days. 14 tablet Faustino Congress, NP   fluconazole (DIFLUCAN) 150 MG tablet Take one tablet at the onset of symptoms. If symptoms are still present 3 days later, take the second tablet. 2 tablet Faustino Congress, NP     PDMP not reviewed this encounter.   Faustino Congress, NP 01/31/21 1706

## 2021-02-14 ENCOUNTER — Other Ambulatory Visit: Payer: Self-pay | Admitting: Family Medicine

## 2021-03-05 ENCOUNTER — Ambulatory Visit: Payer: 59 | Admitting: Nurse Practitioner

## 2021-03-05 ENCOUNTER — Other Ambulatory Visit: Payer: Self-pay

## 2021-03-05 ENCOUNTER — Encounter: Payer: Self-pay | Admitting: Nurse Practitioner

## 2021-03-05 VITALS — BP 133/82 | HR 76 | Temp 98.1°F | Ht 62.5 in | Wt 194.0 lb

## 2021-03-05 DIAGNOSIS — N926 Irregular menstruation, unspecified: Secondary | ICD-10-CM

## 2021-03-05 DIAGNOSIS — N951 Menopausal and female climacteric states: Secondary | ICD-10-CM

## 2021-03-05 LAB — POCT HEMOGLOBIN: Hemoglobin: 13.3 g/dL (ref 11–14.6)

## 2021-03-05 MED ORDER — NORETHINDRONE 0.35 MG PO TABS: 1.0000 | ORAL_TABLET | Freq: Every day | ORAL | 2 refills | Status: DC
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
## 2021-03-15 ENCOUNTER — Other Ambulatory Visit: Payer: Self-pay | Admitting: Family Medicine

## 2021-03-18 ENCOUNTER — Telehealth: Payer: Self-pay | Admitting: Family Medicine

## 2021-03-18 NOTE — Telephone Encounter (Signed)
PA for Semglee Inj has been denied. Pt contacted office because [facility] told her she needed an appt before further refills. Pt was seen earlier this month. Will send notes to insurance from visit in March. Insurance states that med was denied due to not submitting documentation and/or doctor providing explanation for how the requested med will work for patient.

## 2021-03-19 ENCOUNTER — Telehealth: Payer: Self-pay | Admitting: *Deleted

## 2021-03-19 NOTE — Telephone Encounter (Signed)
Insurance states that Emily Braun is not on insurance formulary for this patient and medication will need to be changed

## 2021-03-21 NOTE — Telephone Encounter (Signed)
So essentially at this point we need to know from the insurance company what long-acting insulin is covered Typical would be Lantus, Levemir  Then we will be switching her insulin to that thank you

## 2021-03-22 NOTE — Telephone Encounter (Signed)
Please see telephone message from 03/19/21

## 2021-03-22 NOTE — Telephone Encounter (Signed)
The recent visit was regarding her menstrual cycles but she had a diabetes check up with Nicki Reaper in March. That note should be adequate to get her medicine covered. I gave her refills to last her until she could get back in for her check up. Let me know how I can help. Thanks.

## 2021-03-22 NOTE — Telephone Encounter (Signed)
Insurance states they will cover [employer], [employer]

## 2021-03-29 MED ORDER — LANTUS SOLOSTAR 100 UNIT/ML ~~LOC~~ SOPN: PEN_INJECTOR | SUBCUTANEOUS | 0 refills | Status: DC

## 2021-03-29 NOTE — Telephone Encounter (Signed)
According to information on the drug website this is interchangeable with Lantus  So therefore may go ahead with Lantus follow the same directions as Semglee  May be given 30-day supply with 6 refills if any questions or problems let me know thank you

## 2021-03-29 NOTE — Telephone Encounter (Signed)
Prescription sent electronically to pharmacy. Patient notified and stated she had been taking lantus so it will be no change for her.

## 2021-04-01 ENCOUNTER — Telehealth: Payer: Self-pay | Admitting: Family Medicine

## 2021-04-01 NOTE — Telephone Encounter (Signed)
Patient dropped out form to be completed in your folder in your box.

## 2021-04-03 NOTE — Telephone Encounter (Signed)
Form completed - thank you

## 2021-04-22 ENCOUNTER — Other Ambulatory Visit: Payer: Self-pay | Admitting: Family Medicine

## 2021-04-23 ENCOUNTER — Encounter: Payer: Self-pay | Admitting: Nurse Practitioner

## 2021-04-23 ENCOUNTER — Ambulatory Visit (INDEPENDENT_AMBULATORY_CARE_PROVIDER_SITE_OTHER): Payer: 59 | Admitting: Nurse Practitioner

## 2021-04-23 ENCOUNTER — Other Ambulatory Visit: Payer: Self-pay

## 2021-04-23 VITALS — BP 130/82 | Temp 97.7°F | Ht 62.5 in | Wt 197.2 lb

## 2021-04-23 DIAGNOSIS — Z124 Encounter for screening for malignant neoplasm of cervix: Secondary | ICD-10-CM | POA: Diagnosis not present

## 2021-04-23 DIAGNOSIS — Z1151 Encounter for screening for human papillomavirus (HPV): Secondary | ICD-10-CM | POA: Diagnosis not present

## 2021-04-23 DIAGNOSIS — E7849 Other hyperlipidemia: Secondary | ICD-10-CM | POA: Diagnosis not present

## 2021-04-23 DIAGNOSIS — Z01419 Encounter for gynecological examination (general) (routine) without abnormal findings: Secondary | ICD-10-CM

## 2021-04-23 DIAGNOSIS — Z01411 Encounter for gynecological examination (general) (routine) with abnormal findings: Secondary | ICD-10-CM

## 2021-04-23 DIAGNOSIS — Z1159 Encounter for screening for other viral diseases: Secondary | ICD-10-CM
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
## 2021-04-23 NOTE — Patient Instructions (Signed)
Take 1/2 Pravastatin twice a week

## 2021-04-23 NOTE — Progress Notes (Signed)
   Subjective:    Patient ID: Emily Braun, female    DOB: 22-Jun-1976, 45 y.o.   MRN: [id number]  HPI The patient comes in today for a wellness visit.    A review of their health history was completed.  A review of medications was also completed.  Any needed refills; Flonase  Eating habits: healthy eating   Falls/  MVA accidents in past few months: no  Regular exercise: no but need to  Specialist pt sees on regular basis: Dr.Murphy;ortho   Preventative health issues were discussed.   Additional concerns:     Review of Systems     Objective:   Physical Exam        Assessment & Plan:

## 2021-04-24 ENCOUNTER — Encounter: Payer: Self-pay | Admitting: Nurse Practitioner

## 2021-04-27 LAB — IGP, APTIMA HPV: HPV Aptima: NEGATIVE

## 2021-05-28 ENCOUNTER — Other Ambulatory Visit: Payer: Self-pay | Admitting: Family Medicine

## 2021-05-31 NOTE — H&P (Signed)
PREOPERATIVE H&P  Chief Complaint: LEFT SHOULDER ADHESIVE CAPSULITIS  HPI: Emily Braun is a 45 y.o. female who presents with a diagnosis of LEFT SHOULDER ADHESIVE CAPSULITIS. She had a left humerus ORIF in November 2021 after a fall at work. She has continued to have issues with left shoulder ROM in the post-operative period despite aggressive PT. Symptoms are rated as moderate to severe, and have been worsening.  This is significantly impairing activities of daily living.  She has elected for surgical management.   Past Medical History:  Diagnosis Date   Anxiety    Arthritis    Carpal tunnel syndrome, left    Depression    Fracture of humerus, proximal, left, closed    GERD (gastroesophageal reflux disease)    Hyperlipidemia    Hypertension    followed by pcp   (06-25-2020 pt stated never had stress test)   IBS (irritable bowel syndrome)    Mixed stress and urge urinary incontinence    urologist--- dr Matilde Sprang   Seasonal allergies    Type 2 diabetes mellitus treated with insulin ([place])    followed by pcp---  (06-25-2020 pt stated she does not check her blood sugar at home)   Wears glasses    Past Surgical History:  Procedure Laterality Date   COLONOSCOPY WITH PROPOFOL  02/22/2013  dr [name]   ESOPHAGOGASTRODUODENOSCOPY (EGD) WITH PROPOFOL  08-08-2016  dr Loletha Carrow   LAPAROSCOPIC CHOLECYSTECTOMY  09-10-2007  @[facility]    MAXILLARY ANTROSTOMY Left 03/10/2014   Procedure: LEFT MAXILLARY ANTROSTOMY;  Surgeon: Ascencion Dike, MD;  Location: [facility];  Service: ENT;  Laterality: Left;   ORIF HUMERUS FRACTURE Left 06/30/2020   Procedure: OPEN REDUCTION INTERNAL FIXATION (ORIF) PROXIMAL HUMERUS FRACTURE;  Surgeon: [name], MD;  Location: [facility];  Service: Orthopedics;  Laterality: Left;   ORIF PATELLA FRACTURE Left 2011   WISDOM TOOTH EXTRACTION     Social History   Socioeconomic History   Marital status: Single    Spouse name: Not on file    Number of children: 1   Years of education: Not on file   Highest education level: Not on file  Occupational History   Occupation: patient coordinator    Comment: [employer]  Tobacco Use   Smoking status: Never   Smokeless tobacco: Never  Vaping Use   Vaping Use: Never used  Substance and Sexual Activity   Alcohol use: Not Currently    Comment: socially   Drug use: Never   Sexual activity: Not on file  Other Topics Concern   Not on file  Social History Narrative   Lives with foster-son   Caffeine use: sometimes   Right handed    Social Determinants of Health   Financial Resource Strain: Not on file  Food Insecurity: Not on file  Transportation Needs: Not on file  Physical Activity: Not on file  Stress: Not on file  Social Connections: Not on file   Family History  Problem Relation Age of Onset   Diabetes Father    Hypertension Father    Diabetes Maternal Grandmother    Diabetes Paternal Grandmother    Hypertension Mother    Cancer - Other Brother        testicular   Esophageal cancer Neg Hx    Allergies  Allergen Reactions   Bydureon [Exenatide] Itching    Caused knots in her skin as well as itching at the site of injection   Metformin  And Related Diarrhea   Tradjenta [Linagliptin]     Patient relates abdominal cramps and diarrhea   Prior to Admission medications   Medication Sig Start Date End Date Taking? Authorizing Provider  Calcium Carbonate-Vitamin D (CALCIUM + D PO) Take 600 mg by mouth daily.    [provider]  citalopram (CELEXA) 20 MG tablet Take 1 tablet by mouth once daily 03/17/21   Nilda Simmer, NP  Dulaglutide (TRULICITY) 3 XF/8.1WE SOPN Inject 3 mg as directed once a week. 11/06/20   Kathyrn Drown, MD  fluticasone (FLONASE) 50 MCG/ACT nasal spray Use 2 spray(s) in each nostril once daily 04/23/21   Kathyrn Drown, MD  insulin glargine (LANTUS SOLOSTAR) 100 UNIT/ML Solostar Pen Inject 28 Units into the skin at  bedtime. May titrate up to 55 units 03/29/21   Kathyrn Drown, MD  lisinopril (ZESTRIL) 10 MG tablet Take 1 tablet by mouth once daily 05/28/21   Kathyrn Drown, MD  loratadine (CLARITIN) 10 MG tablet Take 10 mg by mouth at bedtime.     [provider]  norethindrone (MICRONOR) 0.35 MG tablet Take 1 tablet (0.35 mg total) by mouth daily. 03/05/21   Nilda Simmer, NP  oxybutynin (DITROPAN) 5 MG tablet Take 5 mg by mouth 2 (two) times daily. 12/20/19   [provider]  pantoprazole (PROTONIX) 40 MG tablet TAKE 1 TABLET BY MOUTH ONCE DAILY FOR ACID REFLUX 11/06/20   Kathyrn Drown, MD  pravastatin (PRAVACHOL) 20 MG tablet TAKE 1 TABLET BY MOUTH ONCE DAILY ON  MONDAY  AND  FRIDAY 11/06/20   Kathyrn Drown, MD  triamcinolone cream (KENALOG) 0.1 % Apply 1 application topically 2 (two) times daily. Patient taking differently: Apply 1 application topically 2 (two) times daily as needed. 04/24/20   Kathyrn Drown, MD     Positive ROS: All other systems have been reviewed and were otherwise negative with the exception of those mentioned in the HPI and as above.  Physical Exam: General: Alert, no acute distress Cardiovascular: No pedal edema Respiratory: No cyanosis, no use of accessory musculature GI: No organomegaly, abdomen is soft and non-tender Skin: No lesions in the area of chief complaint Neurologic: Sensation intact distally Psychiatric: Patient is competent for consent with normal mood and affect Lymphatic: No axillary or cervical lymphadenopathy  MUSCULOSKELETAL: LUE non-TTP, no edema or bruising, decreased strength; ROM FF 110, ABD 90, ROT 40; NVI   Imaging: n/a   Assessment: LEFT SHOULDER ADHESIVE CAPSULITIS  Plan: Plan for Procedure(s): CLOSED MANIPULATION SHOULDER, WITH INJECTION  The risks benefits and alternatives were discussed with the patient including but not limited to the risks of nonoperative treatment, versus surgical intervention including  infection, bleeding, nerve injury,  blood clots, cardiopulmonary complications, morbidity, mortality, among others, and they were willing to proceed.   Weightbearing: WBAT LUE Orthopedic devices: none Showering: no restrictions Dressing: bandaid Medicines: dose pack  Discharge: home Follow up: 2 weeks   Alisa Graff Office [phone] 05/31/2021 9:18 AM

## 2021-06-02 ENCOUNTER — Other Ambulatory Visit: Payer: Self-pay

## 2021-06-02 ENCOUNTER — Encounter (HOSPITAL_BASED_OUTPATIENT_CLINIC_OR_DEPARTMENT_OTHER): Payer: Self-pay | Admitting: Orthopedic Surgery

## 2021-06-02 DIAGNOSIS — G4730 Sleep apnea, unspecified: Secondary | ICD-10-CM

## 2021-06-02 DIAGNOSIS — M7502 Adhesive capsulitis of left shoulder: Secondary | ICD-10-CM

## 2021-06-02 HISTORY — DX: Sleep apnea, unspecified: G47.30

## 2021-06-02 HISTORY — DX: Adhesive capsulitis of left shoulder: M75.02

## 2021-06-02 NOTE — Progress Notes (Signed)
Spoke w/ via phone for pre-op interview---pt Lab needs dos----  I stat, ekg urine poct             Lab results------none COVID test -----patient states asymptomatic no test needed Arrive at -------530 am 06-08-2021 NPO after MN NO Solid Food.  Clear liquids from MN until---430 am Med rec completed Medications to take morning of surgery -----oxybutynin Diabetic medication -----take 1/2 dose hs lantus insulin night before surgery, no diabetic medications day of surgery Patient instructed no nail polish to be worn day of surgery Patient instructed to bring photo id and insurance card day of surgery Patient aware to have Driver (ride ) / caregiver  mother caledonia zou    for 24 hours after surgery  Patient Special Instructions -----none Pre-Op special Istructions -----none Patient verbalized understanding of instructions that were given at this phone interview. Patient denies shortness of breath, chest pain, fever, cough at this phone interview.

## 2021-06-04 ENCOUNTER — Other Ambulatory Visit: Payer: Self-pay | Admitting: Nurse Practitioner

## 2021-06-08 ENCOUNTER — Encounter (HOSPITAL_BASED_OUTPATIENT_CLINIC_OR_DEPARTMENT_OTHER)
Admission: RE | Disposition: A | Discharge status: Home / Self Care | Payer: Self-pay | Source: Home / Self Care | Attending: Orthopedic Surgery

## 2021-06-08 ENCOUNTER — Ambulatory Visit (HOSPITAL_BASED_OUTPATIENT_CLINIC_OR_DEPARTMENT_OTHER)
Admission: RE | Admit: 2021-06-08 | Discharge: 2021-06-08 | Disposition: A | Discharge status: Home / Self Care | Payer: Worker's Compensation | Attending: Orthopedic Surgery | Admitting: Orthopedic Surgery

## 2021-06-08 ENCOUNTER — Ambulatory Visit (HOSPITAL_BASED_OUTPATIENT_CLINIC_OR_DEPARTMENT_OTHER): Payer: Worker's Compensation | Admitting: Anesthesiology

## 2021-06-08 ENCOUNTER — Other Ambulatory Visit: Payer: Self-pay

## 2021-06-08 ENCOUNTER — Encounter (HOSPITAL_BASED_OUTPATIENT_CLINIC_OR_DEPARTMENT_OTHER): Payer: Self-pay | Admitting: Orthopedic Surgery

## 2021-06-08 DIAGNOSIS — Z8043 Family history of malignant neoplasm of testis: Secondary | ICD-10-CM | POA: Insufficient documentation

## 2021-06-08 DIAGNOSIS — Z79899 Other long term (current) drug therapy: Secondary | ICD-10-CM | POA: Insufficient documentation

## 2021-06-08 DIAGNOSIS — M7502 Adhesive capsulitis of left shoulder: Secondary | ICD-10-CM

## 2021-06-08 DIAGNOSIS — W19XXXA Unspecified fall, initial encounter: Secondary | ICD-10-CM | POA: Diagnosis not present

## 2021-06-08 DIAGNOSIS — Y990 Civilian activity done for income or pay: Secondary | ICD-10-CM | POA: Diagnosis not present

## 2021-06-08 DIAGNOSIS — E119 Type 2 diabetes mellitus without complications: Secondary | ICD-10-CM | POA: Insufficient documentation

## 2021-06-08 DIAGNOSIS — Z8249 Family history of ischemic heart disease and other diseases of the circulatory system: Secondary | ICD-10-CM | POA: Insufficient documentation

## 2021-06-08 DIAGNOSIS — Z833 Family history of diabetes mellitus: Secondary | ICD-10-CM | POA: Insufficient documentation

## 2021-06-08 DIAGNOSIS — Z888 Allergy status to other drugs, medicaments and biological substances status: Secondary | ICD-10-CM | POA: Insufficient documentation

## 2021-06-08 DIAGNOSIS — Y9289 Other specified places as the place of occurrence of the external cause: Secondary | ICD-10-CM | POA: Insufficient documentation

## 2021-06-08 DIAGNOSIS — Z794 Long term (current) use of insulin: Secondary | ICD-10-CM | POA: Diagnosis not present

## 2021-06-08 DIAGNOSIS — Y939 Activity, unspecified: Secondary | ICD-10-CM | POA: Diagnosis not present

## 2021-06-08 HISTORY — DX: Family history of other specified conditions: Z84.89

## 2021-06-08 HISTORY — PX: SHOULDER CLOSED REDUCTION: SHX1051

## 2021-06-08 LAB — POCT I-STAT, CHEM 8
BUN: 13 mg/dL (ref 6–20)
Calcium, Ion: 1.09 mmol/L — ABNORMAL LOW (ref 1.15–1.40)
Chloride: 103 mmol/L (ref 98–111)
Creatinine, Ser: 0.6 mg/dL (ref 0.44–1.00)
Glucose, Bld: 97 mg/dL (ref 70–99)
HCT: 41 % (ref 36.0–46.0)
Hemoglobin: 13.9 g/dL (ref 12.0–15.0)
Potassium: 3.6 mmol/L (ref 3.5–5.1)
Sodium: 139 mmol/L (ref 135–145)
TCO2: 26 mmol/L (ref 22–32)

## 2021-06-08 LAB — POCT PREGNANCY, URINE: Preg Test, Ur: NEGATIVE

## 2021-06-08 LAB — GLUCOSE, CAPILLARY: Glucose-Capillary: 107 mg/dL — ABNORMAL HIGH (ref 70–99)

## 2021-06-08 SURGERY — MANIPULATION, JOINT, SHOULDER, WITH ANESTHESIA
Anesthesia: Monitor Anesthesia Care | Site: Shoulder | Laterality: Left

## 2021-06-08 MED ORDER — LIDOCAINE 2% (20 MG/ML) 5 ML SYRINGE
INTRAMUSCULAR | Status: AC
  Filled 2021-06-08: qty 5

## 2021-06-08 MED ORDER — DEXAMETHASONE SODIUM PHOSPHATE 10 MG/ML IJ SOLN
INTRAMUSCULAR | Status: AC
  Filled 2021-06-08: qty 1

## 2021-06-08 MED ORDER — METHYLPREDNISOLONE 4 MG PO TBPK: ORAL_TABLET | ORAL | 0 refills | Status: DC

## 2021-06-08 MED ORDER — KETOROLAC TROMETHAMINE 30 MG/ML IJ SOLN
INTRAMUSCULAR | Status: AC
  Filled 2021-06-08: qty 1

## 2021-06-08 MED ORDER — ONDANSETRON HCL 4 MG/2ML IJ SOLN
INTRAMUSCULAR | Status: DC | PRN
Start: 2021-06-08 — End: 2021-06-08
  Administered 2021-06-08: 4 mg via INTRAVENOUS

## 2021-06-08 MED ORDER — OXYCODONE HCL 5 MG PO TABS
ORAL_TABLET | ORAL | Status: AC
  Filled 2021-06-08: qty 1

## 2021-06-08 MED ORDER — PROMETHAZINE HCL 25 MG/ML IJ SOLN: 6.2500 mg | INTRAMUSCULAR | Status: DC | PRN

## 2021-06-08 MED ORDER — ONDANSETRON HCL 4 MG/2ML IJ SOLN
INTRAMUSCULAR | Status: AC
  Filled 2021-06-08: qty 2

## 2021-06-08 MED ORDER — ACETAMINOPHEN 10 MG/ML IV SOLN: 1000.0000 mg | Freq: Once | INTRAVENOUS | Status: DC | PRN

## 2021-06-08 MED ORDER — LACTATED RINGERS IV SOLN: INTRAVENOUS | Status: DC

## 2021-06-08 MED ORDER — ACETAMINOPHEN 500 MG PO TABS
ORAL_TABLET | ORAL | Status: AC
  Filled 2021-06-08: qty 2

## 2021-06-08 MED ORDER — OXYCODONE HCL 5 MG/5ML PO SOLN: 5.0000 mg | Freq: Once | ORAL | Status: AC | PRN

## 2021-06-08 MED ORDER — PROPOFOL 10 MG/ML IV BOLUS
INTRAVENOUS | Status: DC | PRN
  Administered 2021-06-08: 30 mg via INTRAVENOUS
  Administered 2021-06-08: 40 mg via INTRAVENOUS
  Administered 2021-06-08 (×3): 30 mg via INTRAVENOUS

## 2021-06-08 MED ORDER — METHYLPREDNISOLONE ACETATE 80 MG/ML IJ SUSP
INTRAMUSCULAR | Status: DC | PRN
  Administered 2021-06-08: 80 mg

## 2021-06-08 MED ORDER — FENTANYL CITRATE (PF) 100 MCG/2ML IJ SOLN
INTRAMUSCULAR | Status: DC | PRN
  Administered 2021-06-08 (×2): 50 ug via INTRAVENOUS

## 2021-06-08 MED ORDER — KETOROLAC TROMETHAMINE 30 MG/ML IJ SOLN
INTRAMUSCULAR | Status: DC | PRN
  Administered 2021-06-08: 30 mg via INTRAVENOUS

## 2021-06-08 MED ORDER — BUPIVACAINE-EPINEPHRINE 0.5% -1:200000 IJ SOLN
INTRAMUSCULAR | Status: DC | PRN
  Administered 2021-06-08: 4 mL

## 2021-06-08 MED ORDER — OXYCODONE HCL 5 MG PO TABS
5.0000 mg | ORAL_TABLET | Freq: Once | ORAL | Status: AC | PRN
  Administered 2021-06-08: 5 mg via ORAL

## 2021-06-08 MED ORDER — ACETAMINOPHEN 160 MG/5ML PO SOLN: 325.0000 mg | ORAL | Status: DC | PRN

## 2021-06-08 MED ORDER — DEXAMETHASONE SODIUM PHOSPHATE 10 MG/ML IJ SOLN: 8.0000 mg | Freq: Once | INTRAMUSCULAR | Status: DC

## 2021-06-08 MED ORDER — FENTANYL CITRATE (PF) 100 MCG/2ML IJ SOLN
INTRAMUSCULAR | Status: AC
  Filled 2021-06-08: qty 2

## 2021-06-08 MED ORDER — ACETAMINOPHEN 500 MG PO TABS
1000.0000 mg | ORAL_TABLET | Freq: Once | ORAL | Status: AC
  Administered 2021-06-08: 1000 mg via ORAL

## 2021-06-08 MED ORDER — AMISULPRIDE (ANTIEMETIC) 5 MG/2ML IV SOLN: 10.0000 mg | Freq: Once | INTRAVENOUS | Status: DC | PRN

## 2021-06-08 MED ORDER — DEXAMETHASONE SODIUM PHOSPHATE 10 MG/ML IJ SOLN
INTRAMUSCULAR | Status: DC | PRN
  Administered 2021-06-08: 8 mg via INTRAVENOUS

## 2021-06-08 MED ORDER — LIDOCAINE 2% (20 MG/ML) 5 ML SYRINGE
INTRAMUSCULAR | Status: DC | PRN
  Administered 2021-06-08: 80 mg via INTRAVENOUS

## 2021-06-08 MED ORDER — FENTANYL CITRATE (PF) 100 MCG/2ML IJ SOLN: 25.0000 ug | INTRAMUSCULAR | Status: DC | PRN

## 2021-06-08 MED ORDER — MIDAZOLAM HCL 2 MG/2ML IJ SOLN
INTRAMUSCULAR | Status: AC
  Filled 2021-06-08: qty 2

## 2021-06-08 MED ORDER — POVIDONE-IODINE 10 % EX SWAB: 2.0000 "application " | Freq: Once | CUTANEOUS | Status: DC

## 2021-06-08 MED ORDER — PROPOFOL 500 MG/50ML IV EMUL
INTRAVENOUS | Status: AC
  Filled 2021-06-08: qty 50

## 2021-06-08 MED ORDER — ACETAMINOPHEN 325 MG PO TABS: 325.0000 mg | ORAL_TABLET | ORAL | Status: DC | PRN

## 2021-06-08 MED ORDER — SCOPOLAMINE 1 MG/3DAYS TD PT72: 1.0000 | MEDICATED_PATCH | TRANSDERMAL | Status: DC

## 2021-06-08 SURGICAL SUPPLY — 9 items
BNDG ADH 1X3 SHEER STRL LF (GAUZE/BANDAGES/DRESSINGS) ×2 IMPLANT
BNDG ADH THN 3X1 STRL LF (GAUZE/BANDAGES/DRESSINGS) ×1
KIT TURNOVER CYSTO (KITS) ×2 IMPLANT
NDL SAFETY ECLIPSE 18X1.5 (NEEDLE) ×1 IMPLANT
NDL SPNL 22GX3.5 QUINCKE BK (NEEDLE) IMPLANT
NEEDLE HYPO 18GX1.5 SHARP (NEEDLE) ×2
NEEDLE SPNL 22GX3.5 QUINCKE BK (NEEDLE) IMPLANT
PAD ALCOHOL SWAB (MISCELLANEOUS) ×4 IMPLANT
SYR CONTROL 10ML LL (SYRINGE) ×4 IMPLANT

## 2021-06-08 NOTE — Op Note (Signed)
06/08/2021  7:34 AM  PATIENT:  Emily Braun    PRE-OPERATIVE DIAGNOSIS:  LEFT SHOULDER ADHESIVE CAPSULITIS  POST-OPERATIVE DIAGNOSIS:  Same  PROCEDURE:  CLOSED MANIPULATION SHOULDER, WITH INJECTION  SURGEON:  [name], MD  ASSISTANT: Aggie Moats, PA-C, he was present and scrubbed throughout the case, critical for completion in a timely fashion, and for retraction, instrumentation, and closure.   ANESTHESIA:   sedation  PREOPERATIVE INDICATIONS:  Emily Braun is a  45 y.o. female with a diagnosis of LEFT SHOULDER ADHESIVE CAPSULITIS who failed conservative measures and elected for surgical management.    The risks benefits and alternatives were discussed with the patient preoperatively including but not limited to the risks of infection, bleeding, nerve injury, cardiopulmonary complications, the need for revision surgery, among others, and the patient was willing to proceed.  OPERATIVE IMPLANTS: na  OPERATIVE FINDINGS: improved ROM  BLOOD LOSS: na  COMPLICATIONS: none  TOURNIQUET TIME: none  OPERATIVE PROCEDURE:  Patient was identified in the preoperative holding area and site was marked by me She was transported to the operating theater and placed on the table in supine position taking care to pad all bony prominences. No abx were indicated  I performed a subacromial and GH injection with 80 of depomedrol  Next I performed a firm manipulation of the Arm in all planes of motion at the shoulder and was happy with her improved motion  POST OPERATIVE PLAN: mobilize for dvt px. PT

## 2021-06-08 NOTE — Anesthesia Preprocedure Evaluation (Addendum)
Anesthesia Evaluation  Patient identified by MRN, date of birth, ID band Patient awake    Reviewed: Allergy & Precautions, NPO status , Patient's Chart, lab work & pertinent test results  Airway Mallampati: III     Mouth opening: Limited Mouth Opening  Dental  (+) Teeth Intact, Dental Advisory Given   Pulmonary sleep apnea ,    breath sounds clear to auscultation       Cardiovascular hypertension, Pt. on medications  Rhythm:Regular Rate:Normal     Neuro/Psych PSYCHIATRIC DISORDERS Anxiety Depression  Neuromuscular disease    GI/Hepatic Neg liver ROS, GERD  Medicated,  Endo/Other  diabetes, Type 2, Insulin Dependent  Renal/GU negative Renal ROS     Musculoskeletal  (+) Arthritis ,   Abdominal   Peds  Hematology   Anesthesia Other Findings   Reproductive/Obstetrics                            Anesthesia Physical Anesthesia Plan  ASA: 2  Anesthesia Plan: MAC   Post-op Pain Management:    Induction: Intravenous  PONV Risk Score and Plan: 3 and Ondansetron, Dexamethasone, Midazolam and Propofol infusion  Airway Management Planned: Simple Face Mask and Natural Airway  Additional Equipment: None  Intra-op Plan:   Post-operative Plan: Extubation in OR  Informed Consent: I have reviewed the patients History and Physical, chart, labs and discussed the procedure including the risks, benefits and alternatives for the proposed anesthesia with the patient or authorized representative who has indicated his/her understanding and acceptance.     Dental advisory given  Plan Discussed with: CRNA  Anesthesia Plan Comments:        Anesthesia Quick Evaluation

## 2021-06-08 NOTE — Discharge Instructions (Addendum)
May take tylenol again at 12pm and may take ibuprofen again 1:30pm.   Call your surgeon if you experience:   1.  Fever over 101.0. 2.  Inability to urinate. 3.  Nausea and/or vomiting. 4.  Extreme swelling or bruising at the surgical site. 5.  Continued bleeding from the incision. 6.  Increased pain, redness or drainage from the incision. 7.  Problems related to your pain medication. 8.  Any problems and/or concerns  Post Anesthesia Home Care Instructions  Activity: Get plenty of rest for the remainder of the day. A responsible individual must stay with you for 24 hours following the procedure.  For the next 24 hours, DO NOT: -Drive a car -Paediatric nurse -Drink alcoholic beverages -Take any medication unless instructed by your physician -Make any legal decisions or sign important papers.  Meals: Start with liquid foods such as gelatin or soup. Progress to regular foods as tolerated. Avoid greasy, spicy, heavy foods. If nausea and/or vomiting occur, drink only clear liquids until the nausea and/or vomiting subsides. Call your physician if vomiting continues.  Special Instructions/Symptoms: Your throat may feel dry or sore from the anesthesia or the breathing tube placed in your throat during surgery. If this causes discomfort, gargle with warm salt water. The discomfort should disappear within 24 hours.  If you had a scopolamine patch placed behind your ear for the management of post- operative nausea and/or vomiting:  1. The medication in the patch is effective for 72 hours, after which it should be removed.  Wrap patch in a tissue and discard in the trash. Wash hands thoroughly with soap and water. 2. You may remove the patch earlier than 72 hours if you experience unpleasant side effects which may include dry mouth, dizziness or visual disturbances. 3. Avoid touching the patch. Wash your hands with soap and water after contact with the patch.

## 2021-06-08 NOTE — Anesthesia Postprocedure Evaluation (Signed)
Anesthesia Post Note  Patient: Emily Braun  Procedure(s) Performed: CLOSED MANIPULATION SHOULDER, WITH INJECTION (Left: Shoulder)     Patient location during evaluation: PACU Anesthesia Type: MAC Level of consciousness: awake and alert Pain management: pain level controlled Vital Signs Assessment: post-procedure vital signs reviewed and stable Respiratory status: spontaneous breathing, nonlabored ventilation, respiratory function stable and patient connected to nasal cannula oxygen Cardiovascular status: stable and blood pressure returned to baseline Postop Assessment: no apparent nausea or vomiting Anesthetic complications: no   No notable events documented.  Last Vitals:  Vitals:   06/08/21 0815 06/08/21 0900  BP: 122/66 118/68  Pulse: 73 71  Resp: (!) 21 14  Temp:  36.9 C  SpO2: 94% 94%    Last Pain:  Vitals:   06/08/21 0900  TempSrc:   PainSc: Anderson Nicloe Frontera

## 2021-06-08 NOTE — Interval H&P Note (Signed)
History and Physical Interval Note:  06/08/2021 7:17 AM  Emily Braun  has presented today for surgery, with the diagnosis of LEFT SHOULDER ADHESIVE CAPSULITIS.  The various methods of treatment have been discussed with the patient and family. After consideration of risks, benefits and other options for treatment, the patient has consented to  Procedure(s): CLOSED MANIPULATION SHOULDER, WITH INJECTION (Left) as a surgical intervention.  The patient's history has been reviewed, patient examined, no change in status, stable for surgery.  I have reviewed the patient's chart and labs.  Questions were answered to the patient's satisfaction.     [name]

## 2021-06-08 NOTE — Transfer of Care (Signed)
Immediate Anesthesia Transfer of Care Note  Patient: Emily Braun  Procedure(s) Performed: CLOSED MANIPULATION SHOULDER, WITH INJECTION (Left: Shoulder)  Patient Location: PACU  Anesthesia Type:MAC  Level of Consciousness: awake, alert  and oriented  Airway & Oxygen Therapy: Patient Spontanous Breathing and Patient connected to face mask oxygen  Post-op Assessment: Report given to RN and Post -op Vital signs reviewed and stable  Post vital signs: Reviewed and stable  Last Vitals:  Vitals Value Taken Time  BP 118/67 06/08/21 0740  Temp    Pulse 86 06/08/21 0742  Resp 22 06/08/21 0742  SpO2 94 % 06/08/21 0742  Vitals shown include unvalidated device data.  Last Pain:  Vitals:   06/08/21 0601  TempSrc: Oral      Patients Stated Pain Goal: 5 (94/94/47 3958)  Complications: No notable events documented.

## 2021-06-09 ENCOUNTER — Encounter (HOSPITAL_BASED_OUTPATIENT_CLINIC_OR_DEPARTMENT_OTHER): Payer: Self-pay | Admitting: Orthopedic Surgery

## 2021-06-11 ENCOUNTER — Other Ambulatory Visit: Payer: Self-pay | Admitting: Family Medicine

## 2021-06-25 ENCOUNTER — Ambulatory Visit: Payer: 59 | Admitting: Family Medicine

## 2021-06-25 ENCOUNTER — Other Ambulatory Visit: Payer: Self-pay

## 2021-06-25 DIAGNOSIS — E7849 Other hyperlipidemia: Secondary | ICD-10-CM

## 2021-06-25 DIAGNOSIS — I10 Essential (primary) hypertension: Secondary | ICD-10-CM

## 2021-06-25 DIAGNOSIS — Z79899 Other long term (current) drug therapy: Secondary | ICD-10-CM

## 2021-06-25 DIAGNOSIS — E119 Type 2 diabetes mellitus without complications: Secondary | ICD-10-CM

## 2021-06-25 DIAGNOSIS — Z794 Long term (current) use of insulin: Secondary | ICD-10-CM

## 2021-06-25 NOTE — Progress Notes (Signed)
   Subjective:    Patient ID: Emily Braun, female    DOB: 30-Aug-1975, 45 y.o.   MRN: [id number]  HPI Patient has underlying diabetes She states she is doing well with compliance Sugars seem to be doing okay but she does not check them often She does try to follow a healthy diet  She does suffer with morbid obesity she does try to watch her portions and stay active.  She has difficult time losing weight  Blood pressure under decent control takes her medicine regular basis watches salt in the diet  Stress levels tolerable taking the Celexa on a regular basis not having any issues   Review of Systems     Objective:   Physical Exam General-in no acute distress Eyes-no discharge Lungs-respiratory rate normal, CTA CV-no murmurs,RRR Extremities skin warm dry no edema Neuro grossly normal Behavior normal, alert Foot exam normal       Assessment & Plan:  Diabetes decent control she needs to check up-to-date labs watch diet stay active try to lose weight  Morbid obesity we will touch base with pharmacology/pharmacist to see if Darcel Bayley offers better benefits versus her Trulicity  Blood pressure good control continue current measures  Hyperlipidemia previously under good control check labs await results  Follow-up in approximately 6 months

## 2021-06-26 LAB — BASIC METABOLIC PANEL
BUN/Creatinine Ratio: 17 (ref 9–23)
BUN: 14 mg/dL (ref 6–24)
CO2: 28 mmol/L (ref 20–29)
Calcium: 9.7 mg/dL (ref 8.7–10.2)
Chloride: 99 mmol/L (ref 96–106)
Creatinine, Ser: 0.81 mg/dL (ref 0.57–1.00)
Glucose: 110 mg/dL — ABNORMAL HIGH (ref 70–99)
Potassium: 4.8 mmol/L (ref 3.5–5.2)
Sodium: 142 mmol/L (ref 134–144)
eGFR: 92 mL/min/{1.73_m2} (ref >59)

## 2021-06-26 LAB — HEPATIC FUNCTION PANEL
ALT: 19 IU/L (ref 0–32)
AST: 18 IU/L (ref 0–40)
Albumin: 4.4 g/dL (ref 3.8–4.8)
Alkaline Phosphatase: 75 IU/L (ref 44–121)
Bilirubin Total: 0.3 mg/dL (ref 0.0–1.2)
Bilirubin, Direct: 0.14 mg/dL (ref 0.00–0.40)
Total Protein: 7 g/dL (ref 6.0–8.5)

## 2021-06-26 LAB — LIPID PANEL
Chol/HDL Ratio: 1.7 ratio (ref 0.0–4.4)
Cholesterol, Total: 108 mg/dL (ref 100–199)
HDL: 63 mg/dL (ref >39)
LDL Chol Calc (NIH): 34 mg/dL (ref 0–99)
Triglycerides: 43 mg/dL (ref 0–149)
VLDL Cholesterol Cal: 11 mg/dL (ref 5–40)

## 2021-06-26 LAB — HEMOGLOBIN A1C
Est. average glucose Bld gHb Est-mCnc: 143 mg/dL
Hgb A1c MFr Bld: 6.6 % — ABNORMAL HIGH (ref 4.8–5.6)

## 2021-06-27 ENCOUNTER — Other Ambulatory Visit: Payer: Self-pay | Admitting: Family Medicine

## 2021-06-27 ENCOUNTER — Other Ambulatory Visit: Payer: Self-pay | Admitting: Nurse Practitioner

## 2021-07-04 ENCOUNTER — Telehealth: Payer: Self-pay | Admitting: Family Medicine

## 2021-07-04 NOTE — Telephone Encounter (Signed)
Previously patient inquired regarding her diabetes went whether to stick with current medicine and move over to [facility]   Based upon information from clinical pharmacist it does seem like the use of Mounjaro would have better response.  If we did go to that we would start off on the lowest dose and gradually build up  If she is interested in doing that we can do so.  Also she can search online to find co-pay card from [facility] with her co-pays being $25.  This is through the manufacturer.  If she is interested in having a shift the medicine please let me know thanks-Dr. Ninfa Linden, Gwenyth Allegra, [facility]  Kathyrn Drown, MD Based on the trial data, Darcel Bayley seems like it has more robust weight loss compared to Ozempic and Trulicity. It is certainly worth trying if the patient wants to lose more weight.   Current guidance is to switch to [facility] at the lowest dose 2.5 mg weekly and titrate up as tolerated since this is a new molecule. If you want to go this route, make sure the patient knows to give the pharmacy a copay card to use with her commercial insurance to bring the price to $25.

## 2021-07-05 NOTE — Telephone Encounter (Signed)
Mychart message sent to patient.

## 2021-07-12 NOTE — Telephone Encounter (Signed)
Left message to return call 

## 2021-07-13 NOTE — Telephone Encounter (Signed)
Patient is interested in trying the new medication and would like it sent to [facility] in [place]  Please call patient with the new directions once sent in

## 2021-07-14 ENCOUNTER — Other Ambulatory Visit: Payer: Self-pay | Admitting: Family Medicine

## 2021-07-14 MED ORDER — TIRZEPATIDE 2.5 MG/0.5ML ~~LOC~~ SOAJ: SUBCUTANEOUS | 3 refills | Status: DC

## 2021-07-14 NOTE — Telephone Encounter (Signed)
Nurses Darcel Bayley was sent in.  Stop Trulicity.  Utilize new medication 2.5 mg subcutaneous once weekly Give [place] update within 3 to 4 weeks how well she is tolerating this Typically we will increase the dose every 4 to 8 weeks as she tolerates Follow-up office visit within 3 to 4 months If having any side effects or problems to notify us

## 2021-07-14 NOTE — Telephone Encounter (Signed)
Pt contacted and verbalized understanding. Instructions also sent through my chart

## 2021-08-09 ENCOUNTER — Other Ambulatory Visit: Payer: Self-pay | Admitting: Family Medicine

## 2021-08-16 ENCOUNTER — Other Ambulatory Visit: Payer: Self-pay | Admitting: Family Medicine

## 2021-08-25 ENCOUNTER — Encounter: Payer: Self-pay | Admitting: Family Medicine

## 2021-08-25 NOTE — Telephone Encounter (Signed)
Nurses My understanding this is on a Producer, television/film/video as well as other ones similar to it such as Trulicity and Ozempic.  My understanding that this is a temporary shortage that could well last through February  Unfortunately until these are available there are not any great alternatives. Victoza is a similar medicine but it is a every day injectable medicine.  Nurses-please connect with Chris-clinical pharmacist to see if he has any insight on how long the production delays will last through.  For [place] I would recommend weathering through this shortage.  Medication should be back on track hopefully by February we can let her know if we hear anything different.  I would also encourage that she talk with her pharmacy on a regular basis to see if there is any improvement with production delays

## 2021-08-27 ENCOUNTER — Other Ambulatory Visit: Payer: Self-pay

## 2021-08-27 ENCOUNTER — Ambulatory Visit (INDEPENDENT_AMBULATORY_CARE_PROVIDER_SITE_OTHER): Payer: 59 | Admitting: Nurse Practitioner

## 2021-08-27 VITALS — HR 68 | Temp 98.7°F | Wt 196.0 lb

## 2021-08-27 DIAGNOSIS — H66001 Acute suppurative otitis media without spontaneous rupture of ear drum, right ear: Secondary | ICD-10-CM | POA: Diagnosis not present

## 2021-08-27 DIAGNOSIS — J069 Acute upper respiratory infection, unspecified: Secondary | ICD-10-CM

## 2021-08-27 MED ORDER — AMOXICILLIN-POT CLAVULANATE 875-125 MG PO TABS: 1.0000 | ORAL_TABLET | Freq: Two times a day (BID) | ORAL | 0 refills | Status: DC

## 2021-08-27 NOTE — Progress Notes (Signed)
° °  Subjective:    Patient ID: Emily Braun, female    DOB: 10-Nov-1975, 45 y.o.   MRN: [id number]  HPI  Patient states symptoms started 2 days ago and has gotten worse. She has a frontal headache, teeth achy and little cough (like trying to clear throat). No fever. Bilateral ear pain.  No sore throat.  No significant cough, occasional clearing of her throat.  Had COVID back in November.  Taking fluids well.  Voiding normal limit.     Objective:   Physical Exam NAD.  Alert, oriented.  Left TM minimal erythema, mild effusion.  Right TM dull with significant erythema.  Pharynx clear and moist.  Neck supple with mild soft anterior adenopathy.  Lungs clear.  Heart regular rate rhythm.  Today's Vitals   08/27/21 1557  Pulse: 68  Temp: 98.7 F (37.1 C)  SpO2: 98%  Weight: 196 lb (88.9 kg)   Body mass index is 35.28 kg/m.        Assessment & Plan:   Viral URI  Non-recurrent acute suppurative otitis media of right ear without spontaneous rupture of tympanic membrane Meds ordered this encounter  Medications   amoxicillin-clavulanate (AUGMENTIN) 875-125 MG tablet    Sig: Take 1 tablet by mouth 2 (two) times daily.    Dispense:  20 tablet    Refill:  0    Order Specific Question:   Supervising Provider    Answer:   Sallee Lange A [[id number]]   OTC meds as directed for symptoms. Warning signs reviewed. Call back next week if no improvement in symptoms, go to ED or urgent care sooner if worse.

## 2021-08-29 ENCOUNTER — Encounter: Payer: Self-pay | Admitting: Nurse Practitioner

## 2021-09-03 ENCOUNTER — Telehealth: Payer: Self-pay | Admitting: Nurse Practitioner

## 2021-09-03 ENCOUNTER — Other Ambulatory Visit: Payer: Self-pay | Admitting: Nurse Practitioner

## 2021-09-03 MED ORDER — FLUCONAZOLE 150 MG PO TABS: ORAL_TABLET | ORAL | 0 refills | Status: DC

## 2021-09-03 NOTE — Telephone Encounter (Signed)
Patient was seen 12/31 and put on antibiotic and now has a yeast infection and needing something called into [facility]

## 2021-09-03 NOTE — Telephone Encounter (Signed)
Done

## 2021-09-05 ENCOUNTER — Other Ambulatory Visit: Payer: Self-pay | Admitting: Family Medicine

## 2021-09-15 LAB — HM DIABETES EYE EXAM

## 2021-09-20 ENCOUNTER — Encounter: Payer: Self-pay | Admitting: Nurse Practitioner

## 2021-09-20 ENCOUNTER — Other Ambulatory Visit: Payer: Self-pay

## 2021-09-20 ENCOUNTER — Ambulatory Visit (INDEPENDENT_AMBULATORY_CARE_PROVIDER_SITE_OTHER): Payer: 59 | Admitting: Nurse Practitioner

## 2021-09-20 VITALS — BP 143/85 | HR 82 | Temp 98.2°F | Wt 201.0 lb

## 2021-09-20 DIAGNOSIS — H65193 Other acute nonsuppurative otitis media, bilateral: Secondary | ICD-10-CM

## 2021-09-20 NOTE — Progress Notes (Signed)
Subjective:    Patient ID: Emily Braun, female    DOB: April 22, 1976, 46 y.o.   MRN: [id number]  HPI  Patient is here to follow up on ear pain. Patient was seen previously by NP who diagnosed her with an ear infection and prescribed Augmentin. Patient states that ears felt better directly after taking abx, however she started to develop a dull ache in both ears. Patient states ear ache intermittent effects both ears with her right ear hurting worse sometimes and then her left ear hurting worse other times x 4 weeks. Patient admits to postnasal drip and occasion sore throat. Today her right ear hurts.  Patient denies fever, body aches, cough, nasal drainage, ear discharge, sinus pain, or sinus pressure.  Patient denies that her ear aches feel like another infection.    Review of Systems  HENT:  Positive for ear pain, postnasal drip and sore throat.       Objective:   Physical Exam Constitutional:      General: She is not in acute distress.    Appearance: Normal appearance. She is not ill-appearing or toxic-appearing.  HENT:     Right Ear: Hearing, ear canal and external ear normal. No decreased hearing noted. Tenderness present. No laceration, drainage or swelling. A middle ear effusion is present. There is no impacted cerumen. No foreign body. No mastoid tenderness. No PE tube. No hemotympanum. Tympanic membrane is not injected, scarred, perforated, erythematous, retracted or bulging.     Left Ear: Hearing, ear canal and external ear normal. No decreased hearing noted. No laceration, drainage, swelling or tenderness. A middle ear effusion is present. There is no impacted cerumen. No foreign body. No mastoid tenderness. No PE tube. No hemotympanum. Tympanic membrane is not injected, scarred, perforated, erythematous, retracted or bulging.     Nose: Nose normal. No nasal deformity, septal deviation, signs of injury, laceration, nasal tenderness, mucosal edema, congestion or rhinorrhea.      Right Turbinates: Not enlarged, swollen or pale.     Left Turbinates: Not enlarged, swollen or pale.     Right Sinus: No maxillary sinus tenderness or frontal sinus tenderness.     Left Sinus: No maxillary sinus tenderness or frontal sinus tenderness.     Mouth/Throat:     Mouth: Mucous membranes are moist.  Cardiovascular:     Rate and Rhythm: Normal rate and regular rhythm.     Pulses: Normal pulses.     Heart sounds: No murmur heard. Pulmonary:     Effort: Pulmonary effort is normal. No respiratory distress.     Breath sounds: Normal breath sounds. No wheezing.  Musculoskeletal:        General: Normal range of motion.     Cervical back: Normal range of motion and neck supple. No rigidity or tenderness.  Lymphadenopathy:     Cervical: No cervical adenopathy.  Skin:    General: Skin is warm.  Neurological:     General: No focal deficit present.     Mental Status: She is alert and oriented to person, place, and time.  Psychiatric:        Mood and Affect: Mood normal.        Behavior: Behavior normal.          Assessment & Plan:  1. Acute effusion of both middle ears -Station tube dysfunction versus allergies - Patient seen trial Zyrtec in the evenings and loratadine in the morning for allergy relief - Low suspicion for bacterial otitis media due  to findings on exam. - May also use nasal saline spray or nasal saline lavage - Continue using Flonase as needed - Return to clinic if symptoms not better or worse.  Will consider referral to ENT for further evaluation.

## 2021-10-03 ENCOUNTER — Other Ambulatory Visit: Payer: Self-pay | Admitting: Family Medicine

## 2021-10-04 ENCOUNTER — Other Ambulatory Visit: Payer: Self-pay | Admitting: Family Medicine

## 2021-10-04 ENCOUNTER — Other Ambulatory Visit: Payer: Self-pay | Admitting: Nurse Practitioner

## 2021-10-04 MED ORDER — CITALOPRAM HYDROBROMIDE 20 MG PO TABS: 20.0000 mg | ORAL_TABLET | Freq: Every day | ORAL | 0 refills | Status: DC

## 2021-10-04 MED ORDER — FLUTICASONE PROPIONATE 50 MCG/ACT NA SUSP: NASAL | 0 refills | Status: DC

## 2021-10-04 MED ORDER — PANTOPRAZOLE SODIUM 40 MG PO TBEC: DELAYED_RELEASE_TABLET | ORAL | 0 refills | Status: DC

## 2021-10-04 NOTE — Telephone Encounter (Signed)
Pt called and stated that her Citalopram was denied refills by Dr Nicki Reaper per [place]. Pt would like a call back to find out why.   [phone]

## 2021-10-22 ENCOUNTER — Encounter: Payer: Self-pay | Admitting: Family Medicine

## 2021-10-22 ENCOUNTER — Ambulatory Visit (INDEPENDENT_AMBULATORY_CARE_PROVIDER_SITE_OTHER): Payer: 59 | Admitting: Family Medicine

## 2021-10-22 ENCOUNTER — Other Ambulatory Visit: Payer: Self-pay

## 2021-10-22 VITALS — BP 130/72 | Temp 97.2°F | Wt 198.2 lb

## 2021-10-22 DIAGNOSIS — Z79899 Other long term (current) drug therapy: Secondary | ICD-10-CM | POA: Diagnosis not present

## 2021-10-22 DIAGNOSIS — E119 Type 2 diabetes mellitus without complications: Secondary | ICD-10-CM | POA: Diagnosis not present

## 2021-10-22 DIAGNOSIS — E1169 Type 2 diabetes mellitus with other specified complication: Secondary | ICD-10-CM | POA: Diagnosis not present

## 2021-10-22 DIAGNOSIS — I10 Essential (primary) hypertension: Secondary | ICD-10-CM | POA: Diagnosis not present

## 2021-10-22 DIAGNOSIS — Z794 Long term (current) use of insulin: Secondary | ICD-10-CM

## 2021-10-22 DIAGNOSIS — E785 Hyperlipidemia, unspecified: Secondary | ICD-10-CM

## 2021-10-22 MED ORDER — LISINOPRIL 10 MG PO TABS
10.0000 mg | ORAL_TABLET | Freq: Every day | ORAL | 1 refills | Status: DC
Start: 2021-10-22 — End: 2022-06-21

## 2021-10-22 MED ORDER — PANTOPRAZOLE SODIUM 40 MG PO TBEC: DELAYED_RELEASE_TABLET | ORAL | 1 refills | Status: DC

## 2021-10-22 MED ORDER — PRAVASTATIN SODIUM 20 MG PO TABS: ORAL_TABLET | ORAL | 3 refills | Status: DC

## 2021-10-22 MED ORDER — TRIAMCINOLONE ACETONIDE 0.1 % EX CREA: 1.0000 "application " | TOPICAL_CREAM | Freq: Two times a day (BID) | CUTANEOUS | 1 refills | Status: DC

## 2021-10-22 MED ORDER — TIRZEPATIDE 2.5 MG/0.5ML ~~LOC~~ SOAJ: SUBCUTANEOUS | 3 refills | Status: DC

## 2021-10-22 MED ORDER — INSULIN GLARGINE-YFGN 100 UNIT/ML ~~LOC~~ SOPN: PEN_INJECTOR | SUBCUTANEOUS | 5 refills | Status: DC

## 2021-10-22 MED ORDER — CITALOPRAM HYDROBROMIDE 20 MG PO TABS: 20.0000 mg | ORAL_TABLET | Freq: Every day | ORAL | 1 refills | Status: DC

## 2021-10-22 NOTE — Progress Notes (Addendum)
° °  Subjective:    Patient ID: Emily Braun, female    DOB: March 08, 1976, 46 y.o.   MRN: [id number]  Diabetes She presents for her follow-up diabetic visit. She has type 2 diabetes mellitus. There are no hypoglycemic associated symptoms. There are no diabetic associated symptoms. There are no hypoglycemic complications. Risk factors for coronary artery disease include hypertension. She is compliant with treatment all of the time. She does not see a podiatrist.Eye exam is current.  Hypertension This is a chronic problem. Risk factors for coronary artery disease include diabetes mellitus. There are no compliance problems.   Pt has not been able to take Mounjaro due to insurance denial.  This patient has diabetes She also has elevated BMI Is at higher risk of heart disease it would be advisable for her to be on Mounjaro.  We will try to send it in again and see what we can do to get it approved    Review of Systems     Objective:   Physical Exam  General-in no acute distress Eyes-no discharge Lungs-respiratory rate normal, CTA CV-no murmurs,RRR Extremities skin warm dry no edema Neuro grossly normal Behavior normal, alert       Assessment & Plan:  1. Type 2 diabetes mellitus without complication, with long-term current use of insulin (HCC) Check up-to-date A1c.  Healthy diet continue current medications.  Recommend starting Mounjaro.  Await lab work first.  Based upon this hopefully will be able to get it prior approval. - Lipid Profile - Hepatic function panel - Basic Metabolic Panel (BMET) - Urine Microalbumin w/creat. ratio - Hemoglobin A1c  2. Hyperlipidemia associated with type 2 diabetes mellitus ([place]) Continue cholesterol medication healthy diet regular activity recommended - Lipid Profile - Hepatic function panel - Basic Metabolic Panel (BMET) - Urine Microalbumin w/creat. ratio - Hemoglobin A1c  3. Essential hypertension, benign Blood pressure good control  continue current measures - Lipid Profile - Hepatic function panel - Basic Metabolic Panel (BMET) - Urine Microalbumin w/creat. ratio - Hemoglobin A1c  4. High risk medication use Lab work ordered - Lipid Profile - Hepatic function panel - Basic Metabolic Panel (BMET) - Urine Microalbumin w/creat. ratio - Hemoglobin A1c  I recommend for the patient to be on Mounjaro.  She has diabetes.  On insulin and metformin.  She used to be on Trulicity.  We are trying to get [facility] approved because it would feel it would do a better job controlling her diabetes hopefully allowing her to come off of insulin and to help her lose her weight

## 2021-10-23 LAB — BASIC METABOLIC PANEL
BUN/Creatinine Ratio: 15 (ref 9–23)
BUN: 11 mg/dL (ref 6–24)
CO2: 23 mmol/L (ref 20–29)
Calcium: 9.4 mg/dL (ref 8.7–10.2)
Chloride: 103 mmol/L (ref 96–106)
Creatinine, Ser: 0.71 mg/dL (ref 0.57–1.00)
Glucose: 112 mg/dL — ABNORMAL HIGH (ref 70–99)
Potassium: 4.1 mmol/L (ref 3.5–5.2)
Sodium: 139 mmol/L (ref 134–144)
eGFR: 107 mL/min/{1.73_m2} (ref >59)

## 2021-10-23 LAB — MICROALBUMIN / CREATININE URINE RATIO
Creatinine, Urine: 216.9 mg/dL
Microalb/Creat Ratio: 9 mg/g creat (ref 0–29)
Microalbumin, Urine: 20.6 ug/mL

## 2021-10-23 LAB — HEPATIC FUNCTION PANEL
ALT: 22 IU/L (ref 0–32)
AST: 23 IU/L (ref 0–40)
Albumin: 4.7 g/dL (ref 3.8–4.8)
Alkaline Phosphatase: 62 IU/L (ref 44–121)
Bilirubin Total: 0.5 mg/dL (ref 0.0–1.2)
Bilirubin, Direct: 0.17 mg/dL (ref 0.00–0.40)
Total Protein: 7.1 g/dL (ref 6.0–8.5)

## 2021-10-23 LAB — LIPID PANEL
Chol/HDL Ratio: 1.8 ratio (ref 0.0–4.4)
Cholesterol, Total: 102 mg/dL (ref 100–199)
HDL: 58 mg/dL (ref >39)
LDL Chol Calc (NIH): 34 mg/dL (ref 0–99)
Triglycerides: 34 mg/dL (ref 0–149)
VLDL Cholesterol Cal: 10 mg/dL (ref 5–40)

## 2021-10-23 LAB — HEMOGLOBIN A1C
Est. average glucose Bld gHb Est-mCnc: 131 mg/dL
Hgb A1c MFr Bld: 6.2 % — ABNORMAL HIGH (ref 4.8–5.6)

## 2021-10-25 NOTE — Progress Notes (Signed)
Mounjaro sent in on 10/22/21

## 2021-12-02 ENCOUNTER — Other Ambulatory Visit: Payer: Self-pay | Admitting: Family Medicine

## 2021-12-17 ENCOUNTER — Telehealth: Payer: Self-pay | Admitting: Family Medicine

## 2021-12-17 NOTE — Telephone Encounter (Signed)
Mounjaro was rx on last visit ? ?Was denied but then had to go through [place] ?Patient was told that another med was possibly approved or would be covered ?Please look into this ?

## 2021-12-17 NOTE — Telephone Encounter (Signed)
Emily Braun was approved back in February. Pt made aware and approval letter has been faxed over to [facility] ?

## 2021-12-31 ENCOUNTER — Emergency Department (HOSPITAL_COMMUNITY)
Admission: EM | Admit: 2021-12-31 | Discharge: 2022-01-01 | Disposition: A | Discharge status: Home / Self Care | Payer: 59 | Attending: Emergency Medicine | Admitting: Emergency Medicine

## 2021-12-31 ENCOUNTER — Encounter (HOSPITAL_COMMUNITY): Payer: Self-pay

## 2021-12-31 ENCOUNTER — Other Ambulatory Visit: Payer: Self-pay

## 2021-12-31 ENCOUNTER — Other Ambulatory Visit: Payer: Self-pay | Admitting: Family Medicine

## 2021-12-31 DIAGNOSIS — R110 Nausea: Secondary | ICD-10-CM | POA: Insufficient documentation

## 2021-12-31 DIAGNOSIS — R519 Headache, unspecified: Secondary | ICD-10-CM | POA: Insufficient documentation

## 2021-12-31 MED ORDER — METOCLOPRAMIDE HCL 5 MG/ML IJ SOLN
10.0000 mg | Freq: Once | INTRAMUSCULAR | Status: AC
  Administered 2021-12-31: 10 mg via INTRAVENOUS
  Filled 2021-12-31: qty 2

## 2021-12-31 MED ORDER — KETOROLAC TROMETHAMINE 30 MG/ML IJ SOLN
30.0000 mg | Freq: Once | INTRAMUSCULAR | Status: AC
  Administered 2021-12-31: 30 mg via INTRAVENOUS
  Filled 2021-12-31: qty 1

## 2021-12-31 MED ORDER — DIPHENHYDRAMINE HCL 25 MG PO CAPS
25.0000 mg | ORAL_CAPSULE | Freq: Once | ORAL | Status: AC
Start: 2021-12-31 — End: 2021-12-31
  Administered 2021-12-31: 25 mg via ORAL
  Filled 2021-12-31: qty 1

## 2021-12-31 NOTE — ED Provider Notes (Signed)
?[facility] ?Provider Note ? ? ?CSN: [id number] ?Arrival date & time: 12/31/21  2107 ? ?  ? ?History ? ?Chief Complaint  ?Patient presents with  ? Nausea  ? ? ?Emily Braun is a 46 y.o. female. ? ?HPI ? ?  ? ?Emily Braun is a 46 y.o. female who presents to the Emergency Department complaining of gradual onset of frontal headache around noon today.  She describes a throbbing sensation across her forehead and into both temples.  Headache has been associated with nausea.  Nausea is worse upon standing.  She has taken Tylenol on 2 separate occasions today both without relief.  She denies any neck pain, fever, chills, visual changes, dizziness and vomiting.  No history of migraine headaches ? ? ?Home Medications ?Prior to Admission medications   ?Medication Sig Start Date End Date Taking? Authorizing Provider  ?Calcium Carbonate-Vitamin D (CALCIUM + D PO) Take 600 mg by mouth daily.    [provider]  ?citalopram (CELEXA) 20 MG tablet Take 1 tablet (20 mg total) by mouth daily. 10/22/21   Kathyrn Drown, MD  ?fluticasone Asencion Islam) 50 MCG/ACT nasal spray Use 2 spray(s) in each nostril once daily 12/02/21   Luking, Elayne Snare, MD  ?insulin glargine-yfgn (SEMGLEE, YFGN,) 100 UNIT/ML Pen INJECT 28 UNITS INTO THE SKIN AT BEDTIME MAY TITRATE UP TO 55 UNITS 10/22/21   Luking, Scott A, MD  ?lisinopril (ZESTRIL) 10 MG tablet Take 1 tablet (10 mg total) by mouth daily. 10/22/21   Kathyrn Drown, MD  ?loratadine (CLARITIN) 10 MG tablet Take 10 mg by mouth at bedtime.     [provider]  ?norethindrone (MICRONOR) 0.35 MG tablet Take 1 tablet by mouth once daily 06/09/21   Nilda Simmer, NP  ?oxybutynin (DITROPAN) 5 MG tablet Take 5 mg by mouth 2 (two) times daily. 12/20/19   [provider]  ?pantoprazole (PROTONIX) 40 MG tablet Take 1 tablet by mouth once a day for acid reflux 10/22/21   Kathyrn Drown, MD  ?pravastatin (PRAVACHOL) 20 MG tablet TAKE 1 TABLET BY MOUTH ONCE DAILY  ON MONDAYS AND FRIDAYS 10/22/21   Kathyrn Drown, MD  ?tirzepatide Darcel Bayley) 2.5 MG/0.5ML Pen Utilize 2.5 mg subcutaneous once weekly 10/22/21   Kathyrn Drown, MD  ?triamcinolone cream (KENALOG) 0.1 % Apply 1 application topically 2 (two) times daily. 10/22/21   Kathyrn Drown, MD  ?   ? ?Allergies    ?Bydureon [exenatide], Metformin and related, and Tradjenta [linagliptin]   ? ?Review of Systems   ?Review of Systems  ?Constitutional:  Negative for appetite change and fever.  ?Eyes:  Negative for pain and visual disturbance.  ?Respiratory:  Negative for shortness of breath.   ?Cardiovascular:  Negative for chest pain.  ?Gastrointestinal:  Positive for nausea. Negative for abdominal pain, diarrhea and vomiting.  ?Musculoskeletal:  Negative for neck pain and neck stiffness.  ?Skin:  Negative for rash.  ?Neurological:  Positive for headaches. Negative for dizziness, syncope, facial asymmetry, speech difficulty and weakness.  ? ?Physical Exam ?Updated Vital Signs ?BP (!) 161/88 (BP Location: Right Arm)   Pulse 88   Temp 98.7 ?F (37.1 ?C) (Oral)   Resp 18   Ht 5' 2.5" (1.588 m)   Wt 90 kg   SpO2 99%   BMI 35.71 kg/m?  ?Physical Exam ?Vitals and nursing note reviewed.  ?Constitutional:   ?   Appearance: Normal appearance. She is not ill-appearing.  ?HENT:  ?  Right Ear: Tympanic membrane and ear canal normal.  ?   Left Ear: Tympanic membrane and ear canal normal.  ?   Mouth/Throat:  ?   Mouth: Mucous membranes are moist.  ?Eyes:  ?   Extraocular Movements: Extraocular movements intact.  ?   Conjunctiva/sclera: Conjunctivae normal.  ?   Pupils: Pupils are equal, round, and reactive to light.  ?   Comments: Eye pain with left lateral gaze.  ?Cardiovascular:  ?   Rate and Rhythm: Normal rate and regular rhythm.  ?   Pulses: Normal pulses.  ?Pulmonary:  ?   Effort: Pulmonary effort is normal.  ?Abdominal:  ?   Palpations: Abdomen is soft.  ?   Tenderness: There is no abdominal tenderness.  ?Musculoskeletal:     ?    General: Normal range of motion.  ?   Cervical back: Normal range of motion. No rigidity.  ?Lymphadenopathy:  ?   Cervical: No cervical adenopathy.  ?Skin: ?   General: Skin is warm.  ?   Capillary Refill: Capillary refill takes less than 2 seconds.  ?Neurological:  ?   General: No focal deficit present.  ?   Mental Status: She is alert.  ?   Sensory: No sensory deficit.  ?   Motor: No weakness.  ? ? ?ED Results / Procedures / Treatments   ?Labs ?(all labs ordered are listed, but only abnormal results are displayed) ?Labs Reviewed - No data to display ? ?EKG ?None ? ?Radiology ?No results found. ? ?Procedures ?Procedures  ? ? ?Medications Ordered in ED ?Medications - No data to display ? ?ED Course/ Medical Decision Making/ A&P ?  ?                        ?Medical Decision Making ?Patient here with gradual onset of headache around noon today.  He describes headache as throbbing sensation to her forehead and both temples.  Headache has been associated with nausea which becomes worse when she stands.  No neck pain or stiffness. ? ?On exam, patient well-appearing nontoxic.  No nuchal rigidity.  She does endorse having some eye pain with left lateral gaze.  Her EOMs are intact.  No focal neurodeficits on exam.  My clinical suspicion is this is related to migraine headache.  No concerning symptoms for [facility] or meningitis.  No injury to suggest subdural hematoma. ? ?Plan includes migraine cocktail and if symptoms have improved she will likely be discharged home with close outpatient follow-up. ? ?Risk ?Prescription drug management. ? ? ?On recheck, patient reports headache is much improved after receiving migraine cocktail.  She has tolerated oral fluids without difficulty.  Headache and nausea both improved she is ambulatory in the department with steady gait.  No focal neurodeficits on exam. ?I feel that she is appropriate for discharge home, prescription provided for antiemetic. ? ? ? ? ? ? ? ? ? ? ?Final Clinical  Impression(s) / ED Diagnoses ?Final diagnoses:  ?Acute nonintractable headache, unspecified headache type  ? ? ?Rx / DC Orders ?ED Discharge Orders   ? ? None  ? ?  ? ? ?  ?Kem Parkinson, PA-C ?12/31/21 2358 ? ?  ?Noemi Chapel, MD ?01/01/22 1452 ? ?

## 2021-12-31 NOTE — ED Triage Notes (Signed)
Pt arrived via POV c/o headache and nausea that began around noon today. Pt reports taking '1000mg'$  Tylenol apprx 1 hr PTA without relief.  ?

## 2021-12-31 NOTE — Discharge Instructions (Signed)
Follow-up with your doctor for recheck, return emergency department for any new or worsening symptoms.  You have been prescribed a medication that you may take if the nausea returns. ?

## 2022-01-01 ENCOUNTER — Ambulatory Visit
Admission: EM | Admit: 2022-01-01 | Discharge: 2022-01-01 | Disposition: A | Discharge status: Home / Self Care | Payer: 59 | Attending: Family Medicine | Admitting: Family Medicine

## 2022-01-01 DIAGNOSIS — R519 Headache, unspecified: Secondary | ICD-10-CM | POA: Diagnosis not present

## 2022-01-01 MED ORDER — AMOXICILLIN-POT CLAVULANATE 875-125 MG PO TABS: 1.0000 | ORAL_TABLET | Freq: Two times a day (BID) | ORAL | 0 refills | Status: DC

## 2022-01-01 MED ORDER — ONDANSETRON HCL 4 MG PO TABS: 4.0000 mg | ORAL_TABLET | Freq: Four times a day (QID) | ORAL | 0 refills | Status: DC

## 2022-01-01 MED ORDER — KETOROLAC TROMETHAMINE 60 MG/2ML IM SOLN
60.0000 mg | Freq: Once | INTRAMUSCULAR | Status: AC
  Administered 2022-01-01: 60 mg via INTRAMUSCULAR

## 2022-01-01 MED ORDER — ONDANSETRON 4 MG PO TBDP
4.0000 mg | ORAL_TABLET | Freq: Once | ORAL | Status: AC
  Administered 2022-01-01: 4 mg via ORAL

## 2022-01-01 NOTE — Discharge Instructions (Addendum)
Meds ordered this encounter  ?Medications  ? ketorolac (TORADOL) injection 60 mg  ? ondansetron (ZOFRAN-ODT) disintegrating tablet 4 mg  ? amoxicillin-clavulanate (AUGMENTIN) 875-125 MG tablet  ?  Sig: Take 1 tablet by mouth every 12 (twelve) hours.  ?  Dispense:  20 tablet  ?  Refill:  0  ? ? ?

## 2022-01-01 NOTE — ED Triage Notes (Signed)
Pt presents with c/o headache that began yesterday, was treated in ed with no relief, has never had migraines before  ?

## 2022-01-02 ENCOUNTER — Inpatient Hospital Stay: Admission: RE | Admit: 2022-01-02 | Payer: Self-pay | Source: Ambulatory Visit

## 2022-01-03 ENCOUNTER — Ambulatory Visit: Payer: 59 | Admitting: Family Medicine

## 2022-01-03 ENCOUNTER — Encounter: Payer: Self-pay | Admitting: Family Medicine

## 2022-01-03 VITALS — BP 132/80 | HR 69 | Temp 97.9°F | Wt 197.6 lb

## 2022-01-03 DIAGNOSIS — R519 Headache, unspecified: Secondary | ICD-10-CM | POA: Diagnosis not present

## 2022-01-03 NOTE — Progress Notes (Signed)
? ?Subjective:  ?Patient ID: Emily Braun, female    DOB: 12/23/1975  Age: 46 y.o. MRN: [id number] ? ?CC: ?Chief Complaint  ?Patient presents with  ? Headache  ?  Pt went to ER on 12/31/21 and then Urgent Care on 01/01/22. ER states bad headache but Urgent Care said possible bad sinus infection. Urgent Care suggested Head CT. Pt did begin on Mounjaro last week; took it for 4 weeks in November with no issues.   ? ? ?HPI: ? ?46 year old female presents for follow-up regarding ongoing headaches. ? ?Patient recently seen in ER on 5/5 and subsequently at urgent care on 5/6.  It is unclear whether this is secondary to migraine headache or not.  She has received migraine cocktail in the ER with improvement.  At urgent care was given antibiotic therapy to cover for potential sinusitis.  Patient states that her headaches are improved however she still has a sharp pain tickly with certain movements of her head and when she bends down and gets up too quickly.  She states that she initially had some nausea and photophobia which is suggestive of migraine.  She has never had migraines before.  Patient is requesting neuroimaging given new onset headaches and the fact that she has not fully improved.   ? ?Patient Active Problem List  ? Diagnosis Date Noted  ? Acute nonintractable headache 01/04/2022  ? Bilateral carpal tunnel syndrome 03/22/2019  ? Hyperlipidemia 03/16/2018  ? DM (diabetes mellitus), type 2 ([place]) 08/28/2015  ? GERD (gastroesophageal reflux disease) 08/25/2014  ? Morbid obesity ([place]) 08/14/2013  ? Essential hypertension, benign 07/03/2013  ? ? ?Social Hx   ?Social History  ? ?Socioeconomic History  ? Marital status: Single  ?  Spouse name: Not on file  ? Number of children: 1  ? Years of education: Not on file  ? Highest education level: Not on file  ?Occupational History  ? Occupation: patient coordinator  ?  Comment: [employer]  ?Tobacco Use  ? Smoking status: Never  ? Smokeless tobacco: Never   ?Vaping Use  ? Vaping Use: Never used  ?Substance and Sexual Activity  ? Alcohol use: Not Currently  ?  Comment: socially  ? Drug use: Never  ? Sexual activity: Yes  ?  Birth control/protection: None  ?Other Topics Concern  ? Not on file  ?Social History Narrative  ? Lives with foster-son  ? Caffeine use: sometimes  ? Right handed   ? ?Social Determinants of Health  ? ?Financial Resource Strain: Not on file  ?Food Insecurity: Not on file  ?Transportation Needs: Not on file  ?Physical Activity: Not on file  ?Stress: Not on file  ?Social Connections: Not on file  ? ? ?Review of Systems ?Per HPI ? ?Objective:  ?BP 132/80   Pulse 69   Temp 97.9 ?F (36.6 ?C)   Wt 197 lb 9.6 oz (89.6 kg)   LMP 12/23/2021   SpO2 98%   BMI 35.57 kg/m?  ? ? ?  01/03/2022  ?  3:27 PM 01/01/2022  ? 12:33 PM 01/01/2022  ? 12:13 AM  ?BP/Weight  ?Systolic BP 601 093 235  ?Diastolic BP 80 87 77  ?Wt. (Lbs) 197.6    ?BMI 35.57 kg/m2    ? ? ?Physical Exam ?Vitals and nursing note reviewed.  ?Constitutional:   ?   General: She is not in acute distress. ?   Appearance: Normal appearance. She is obese.  ?HENT:  ?   Head: Normocephalic  and atraumatic.  ?Eyes:  ?   General:     ?   Right eye: No discharge.     ?   Left eye: No discharge.  ?   Conjunctiva/sclera: Conjunctivae normal.  ?   Pupils: Pupils are equal, round, and reactive to light.  ?Cardiovascular:  ?   Rate and Rhythm: Normal rate and regular rhythm.  ?Pulmonary:  ?   Effort: Pulmonary effort is normal.  ?   Breath sounds: Normal breath sounds. No wheezing, rhonchi or rales.  ?Neurological:  ?   General: No focal deficit present.  ?   Mental Status: She is alert.  ?Psychiatric:     ?   Mood and Affect: Mood normal.     ?   Behavior: Behavior normal.  ? ? ?Lab Results  ?Component Value Date  ? WBC 8.2 08/25/2014  ? HGB 13.9 06/08/2021  ? HCT 41.0 06/08/2021  ? PLT 370 08/25/2014  ? GLUCOSE 112 (H) 10/22/2021  ? CHOL 102 10/22/2021  ? TRIG 34 10/22/2021  ? HDL 58 10/22/2021  ? LDLDIRECT 28  09/20/2019  ? [place] 34 10/22/2021  ? ALT 22 10/22/2021  ? AST 23 10/22/2021  ? NA 139 10/22/2021  ? K 4.1 10/22/2021  ? CL 103 10/22/2021  ? CREATININE 0.71 10/22/2021  ? BUN 11 10/22/2021  ? CO2 23 10/22/2021  ? TSH 1.162 08/25/2014  ? HGBA1C 6.2 (H) 10/22/2021  ? ? ? ?Assessment & Plan:  ? ?Problem List Items Addressed This Visit   ? ?  ? Other  ? Acute nonintractable headache - Primary  ?  New onset headache.  Patient has some features of migraine.  Further evaluation with CT head without contrast. ? ?  ?  ? Relevant Orders  ? CT HEAD WO CONTRAST (5MM)  ? ?Thersa Salt DO ?[place] ? ?

## 2022-01-03 NOTE — ED Provider Notes (Signed)
?[facility] ? ? ?[id number] ?01/01/22 Arrival Time: 2703 ? ?ASSESSMENT & PLAN: ? ?1. Bad headache   ? ?Meds ordered this encounter  ?Medications  ? ketorolac (TORADOL) injection 60 mg  ? ondansetron (ZOFRAN-ODT) disintegrating tablet 4 mg  ? amoxicillin-clavulanate (AUGMENTIN) 875-125 MG tablet  ?  Sig: Take 1 tablet by mouth every 12 (twelve) hours.  ?  Dispense:  20 tablet  ?  Refill:  0  ? ?Normal neurological exam. Afebrile without nuchal rigidity. Discussed. Current presentation and symptoms are consistent with prior migraines and are not consistent with SAH, ICH, meningitis, or temporal arteritis. No indication for neurodiagnostic workup at this time. Discussed. ? ?Recommend: ? Follow-up Information   ? ? [facility].   ?Specialty: Emergency Medicine ?Why: If symptoms worsen in any way. ?Contact information: ?[facility] ?[id number] mc ?[address] [place] ?[phone] ? ?  ?  ? ?  ?  ? ?  ? ? ? ?Reviewed expectations re: course of current medical issues. Questions answered. ?Outlined signs and symptoms indicating need for more acute intervention. ?Patient verbalized understanding. ?After Visit Summary given. ? ? ?SUBJECTIVE: ?History from: Patient ?Patient is able to give a clear and coherent history. ? ?AARIONNA GERMER is a 46 y.o. female who presents with complaint of a bad headache. Seen in ED last evening. Temp relief; mild; after meds given. HA continuing today. Not worst HA of life. H/O HA "but not as bad". ?Preceding aura: no. ?Nausea/vomiting: nausea. ?Vision changes: no. ?Increased sensitivity to light and to noises: yes. ?Fever: no. ?Sinus pressure/congestion: no. ?Extremity weakness: no. ?Current headache has limited normal daily activities. ?Denies dizziness, loss of balance, muscle weakness, and speech difficulties. ?No head injury reported. Ambulatory without difficulty. ?No recent travel. ? ? ?OBJECTIVE: ? ?Vitals:  ? 01/01/22 1233  ?BP: (!)  157/87  ?Pulse: 78  ?Resp: 18  ?Temp: 99.4 ?F (37.4 ?C)  ?SpO2: 94%  ?  ?General appearance: alert; NAD ?HENT: normocephalic; atraumatic ?Eyes: PERRLA; EOMI; conjunctivae normal ?Neck: supple with FROM ?Lungs: clear to auscultation bilaterally; unlabored respirations ?Heart: regular rate and rhythm ?Extremities: no edema; symmetrical with no gross deformities ?Skin: warm and dry ?Neurologic: alert; speech is fluent and clear without dysarthria or aphasia; CN 2-12 grossly intact; no facial droop; normal gait; normal symmetric reflexes; normal extremity strength and sensation throughout ?Psychological: alert and cooperative; normal mood and affect ? ? ? ?Allergies  ?Allergen Reactions  ? Bydureon [Exenatide] Itching  ?  Caused knots in her skin as well as itching at the site of injection  ? Metformin And Related Diarrhea  ? Tradjenta [Linagliptin]   ?  Patient relates abdominal cramps and diarrhea  ? ? ?Past Medical History:  ?Diagnosis Date  ? Adhesive capsulitis of left shoulder 06/02/2021  ? Anxiety   ? Arthritis   ? Carpal tunnel syndrome, left   ? Depression   ? Family history of adverse reaction to anesthesia   ? brother fights when wakes up  ? Fracture of humerus, proximal, left, closed 06/21/2020  ? GERD (gastroesophageal reflux disease)   ? Hypertension   ? followed by pcp   (06-25-2020 pt stated never had stress test)  ? IBS (irritable bowel syndrome)   ? Mixed stress and urge urinary incontinence   ? urologist--- dr Matilde Sprang  ? Seasonal allergies   ? Sleep apnea 06/02/2021  ? uses mouth guard no cpap used severe osa per sleep study 3 months ago  ? Type 2 diabetes  mellitus treated with insulin ([place])   ? followed by pcp---  (06-25-2020 pt stated she does not check her blood sugar at home)  ? Wears glasses   ? ?Social History  ? ?Socioeconomic History  ? Marital status: Single  ?  Spouse name: Not on file  ? Number of children: 1  ? Years of education: Not on file  ? Highest education level: Not on file   ?Occupational History  ? Occupation: patient coordinator  ?  Comment: [employer]  ?Tobacco Use  ? Smoking status: Never  ? Smokeless tobacco: Never  ?Vaping Use  ? Vaping Use: Never used  ?Substance and Sexual Activity  ? Alcohol use: Not Currently  ?  Comment: socially  ? Drug use: Never  ? Sexual activity: Yes  ?  Birth control/protection: None  ?Other Topics Concern  ? Not on file  ?Social History Narrative  ? Lives with foster-son  ? Caffeine use: sometimes  ? Right handed   ? ?Social Determinants of Health  ? ?Financial Resource Strain: Not on file  ?Food Insecurity: Not on file  ?Transportation Needs: Not on file  ?Physical Activity: Not on file  ?Stress: Not on file  ?Social Connections: Not on file  ?Intimate Partner Violence: Not on file  ? ?Family History  ?Problem Relation Age of Onset  ? Diabetes Father   ? Hypertension Father   ? Diabetes Maternal Grandmother   ? Diabetes Paternal Grandmother   ? Hypertension Mother   ? Cancer - Other Brother   ?     testicular  ? Esophageal cancer Neg Hx   ? ?Past Surgical History:  ?Procedure Laterality Date  ? COLONOSCOPY WITH PROPOFOL    ? 02/22/2013  dr [name]  ? ESOPHAGOGASTRODUODENOSCOPY (EGD) WITH PROPOFOL    ? 08-08-2016  dr Loletha Carrow  ? LAPAROSCOPIC CHOLECYSTECTOMY  09-10-2007  '@[facility]'$   ? MAXILLARY ANTROSTOMY Left 03/10/2014  ? Procedure: LEFT MAXILLARY ANTROSTOMY;  Surgeon: Ascencion Dike, MD;  Location: [facility];  Service: ENT;  Laterality: Left;  ? ORIF HUMERUS FRACTURE Left 06/30/2020  ? Procedure: OPEN REDUCTION INTERNAL FIXATION (ORIF) PROXIMAL HUMERUS FRACTURE;  Surgeon: [name], MD;  Location: [facility];  Service: Orthopedics;  Laterality: Left;  ? ORIF PATELLA FRACTURE Left 2011  ? SHOULDER CLOSED REDUCTION Left 06/08/2021  ? Procedure: CLOSED MANIPULATION SHOULDER, WITH INJECTION;  Surgeon: [name], MD;  Location: [facility];  Service: Orthopedics;  Laterality: Left;  ?  WISDOM TOOTH EXTRACTION    ? yrs ago per pt on 06-02-2021  ? ? ?  ?Vanessa Kick, MD ?01/03/22 1427 ? ?

## 2022-01-03 NOTE — Patient Instructions (Signed)
We will work on the CT scan. ? ?Follow up with Dr. Nicki Reaper or Hoyle Sauer if this persists. ? ?Take care ? ?Dr. Lacinda Axon  ?

## 2022-01-04 DIAGNOSIS — R519 Headache, unspecified: Secondary | ICD-10-CM | POA: Insufficient documentation

## 2022-01-04 NOTE — Assessment & Plan Note (Signed)
New onset headache.  Patient has some features of migraine.  Further evaluation with CT head without contrast. ?

## 2022-01-07 ENCOUNTER — Encounter: Payer: Self-pay | Admitting: Nurse Practitioner

## 2022-01-07 ENCOUNTER — Ambulatory Visit: Payer: 59 | Admitting: Nurse Practitioner

## 2022-01-07 VITALS — BP 138/84 | HR 69 | Temp 97.7°F | Ht 62.5 in | Wt 197.6 lb

## 2022-01-07 DIAGNOSIS — G44209 Tension-type headache, unspecified, not intractable: Secondary | ICD-10-CM | POA: Insufficient documentation

## 2022-01-07 DIAGNOSIS — M26609 Unspecified temporomandibular joint disorder, unspecified side: Secondary | ICD-10-CM

## 2022-01-07 MED ORDER — NAPROXEN 500 MG PO TABS: 500.0000 mg | ORAL_TABLET | Freq: Two times a day (BID) | ORAL | 0 refills | Status: DC

## 2022-01-07 NOTE — Progress Notes (Signed)
? ?Subjective:  ? ? Patient ID: Emily Braun, female    DOB: Jun 07, 1976, 46 y.o.   MRN: [id number] ? ?HPI ?Presents for recheck on her headaches.  Was seen at local ED on 5/5 in urgent care on 5/6.  Was given Augmentin for possible sinusitis.  Was seen by Dr. Lacinda Axon at our office on 01/03/2022.  Due to patient's concerns, a CT scan of the head was ordered but patient states that insurance would not approve it.  The headache initially was severe for 2 days and gradually got better on 5/7.  Started as a sharp constant pain in the frontal area.  Now having a dull achy pain mainly in the occipital area.  Also has mild pain when she moves her eyes especially side to side.  No previous history of headaches.  No family history of migraines.  Did not have a headache on 5/10 and 5/11.  This morning began having a "regular" headache which she describes as a dull ache.  No nausea or vomiting.  No visual changes.  Has had some mild photosensitivity.  Minimal photophobia.  Had a normal eye exam in January.  No numbness or weakness of the face arms or legs.  Has tried Tylenol and ibuprofen for her headache.  Some relief with lying down or resting/sleeping. ?Of note patient has a history of significant TMJ symptoms.  Has clicking in the jaw area when opening and closing her mouth.  Has even had Botox injections.  Has noticed this being much worse lately wearing her new device for her CPAP which pulls her jaw forward.  ? ? ? ? ?   ?Objective:  ? Physical Exam ?NAD.  Alert, oriented.  Pupils equal and reactive to light.  EOMs intact without nystagmus.  Speech clear.  Face symmetrical.  TMs minimal clear effusion, no erythema.  Pharynx clear.  Neck supple with minimal anterior adenopathy.  Extremely tight muscles noted along the TMJ area bilaterally with a slight popping sound noted with opening and closing of the mouth.  Palpation of the neck and upper back area nontender.  Lungs clear.  Heart regular rate rhythm.  Gait  normal. ?Today's Vitals  ? 01/07/22 1447  ?BP: 138/84  ?Pulse: 69  ?Temp: 97.7 ?F (36.5 ?C)  ?TempSrc: Temporal  ?SpO2: 97%  ?Weight: 197 lb 9.6 oz (89.6 kg)  ?Height: 5' 2.5" (1.588 m)  ? ?Body mass index is 35.57 kg/m?. ? ? ? ?   ?Assessment & Plan:  ? ?Problem List Items Addressed This Visit   ? ?  ? Musculoskeletal and Integument  ? TMJ (temporomandibular joint syndrome) - Primary  ?  ? Other  ? Muscle contraction headache  ? Relevant Medications  ? naproxen (NAPROSYN) 500 MG tablet  ? ?It is unclear about the type of headache that she initially had on 5/5 and 5/6 but it has a migraine type of presentation.  This is since resolved.  Explained to patient that she is not having any red flags as far as her headaches that warrants an MRI or CT scan at this time.  If she develops any symptoms patient to seek help immediately. ?Meds ordered this encounter  ?Medications  ? naproxen (NAPROSYN) 500 MG tablet  ?  Sig: Take 1 tablet (500 mg total) by mouth 2 (two) times daily with a meal. Prn headache  ?  Dispense:  30 tablet  ?  Refill:  0  ?  Order Specific Question:   Supervising Provider  ?  Answer:   Sallee Lange A [[id number]]  ? ?Recommend anti-inflammatory medications ice/heat applications to the TMJ area and use of a TENS unit to help with the muscle spasms and tension.  Patient will also discuss using a different apparatus for her sleep apnea. ?Given written and verbal information on TMJ syndrome.  Consider massage therapy. ?Recheck if symptoms worsen or persist. ? ?

## 2022-01-07 NOTE — Patient Instructions (Signed)
Temporomandibular Joint Syndrome  Temporomandibular joint syndrome (TMJ syndrome) is a condition that causes pain in the temporomandibular joints. These joints are located near your ears and allow your jaw to open and close. For people with TMJ syndrome, chewing, biting, or other movements of the jaw can be difficult or painful. TMJ syndrome is often mild and goes away within a few weeks. However, sometimes the condition becomes a long-term (chronic) problem. What are the causes? This condition may be caused by: Grinding your teeth or clenching your jaw. Some people do this when they are stressed. Arthritis. An injury to the jaw. A head or neck injury. Teeth or dentures that are not aligned well. In some cases, the cause of TMJ syndrome may not be known. What are the signs or symptoms? The most common symptom of this condition is aching pain on the side of the head in the area of the TMJ. Other symptoms may include: Pain when moving your jaw, such as when chewing or biting. Not being able to open your jaw all the way. Making a clicking sound when you open your mouth. Headache. Earache. Neck or shoulder pain. How is this diagnosed? This condition may be diagnosed based on: Your symptoms and medical history. A physical exam. Your health care provider may check the range of motion of your jaw. Imaging tests, such as X-rays or an MRI. You may also need to see your dentist, who will check if your teeth and jaw are lined up correctly. How is this treated? TMJ syndrome often goes away on its own. If treatment is needed, it may include: Eating soft foods and applying ice or heat. Medicines to relieve pain or inflammation. Medicines or massage to relax the muscles. A splint, bite plate, or mouthpiece to prevent teeth grinding or jaw clenching. Relaxation techniques or counseling to help reduce stress. A therapy for pain in which an electrical current is applied to the nerves through the skin  (transcutaneous electrical nerve stimulation). Acupuncture. This may help to relieve pain. Jaw surgery. This is rarely needed. Follow these instructions at home:  Eating and drinking Eat a soft diet if you are having trouble chewing. Avoid foods that require a lot of chewing. Do not chew gum. General instructions Take over-the-counter and prescription medicines only as told by your health care provider. If directed, put ice on the painful area. To do this: Put ice in a plastic bag. Place a towel between your skin and the bag. Leave the ice on for 20 minutes, 2-3 times a day. Remove the ice if your skin turns bright red. This is very important. If you cannot feel pain, heat, or cold, you have a greater risk of damage to the area. Apply a warm, wet cloth (warm compress) to the painful area as told. Massage your jaw area and do any jaw stretching exercises as told by your health care provider. If you were given a splint, bite plate, or mouthpiece, wear it as told by your health care provider. Keep all follow-up visits. This is important. Where to find more information National Institute of Dental and Craniofacial Research: www.nidcr.nih.gov Contact a health care provider if: You have trouble eating. You have new or worsening symptoms. Get help right away if: Your jaw locks. Summary Temporomandibular joint syndrome (TMJ syndrome) is a condition that causes pain in the temporomandibular joints. These joints are located near your ears and allow your jaw to open and close. TMJ syndrome is often mild and goes away within   a few weeks. However, sometimes the condition becomes a long-term (chronic) problem. Symptoms include an aching pain on the side of the head in the area of the TMJ, pain when chewing or biting, and being unable to open your jaw all the way. You may also make a clicking sound when you open your mouth. TMJ syndrome often goes away on its own. If treatment is needed, it may  include medicines to relieve pain, reduce inflammation, or relax the muscles. A splint, bite plate, or mouthpiece may also be used to prevent teeth grinding or jaw clenching. This information is not intended to replace advice given to you by your health care provider. Make sure you discuss any questions you have with your health care provider. Document Revised: 03/28/2021 Document Reviewed: 03/28/2021 Elsevier Patient Education  2023 Elsevier Inc.  

## 2022-01-13 ENCOUNTER — Other Ambulatory Visit: Payer: Self-pay | Admitting: Nurse Practitioner

## 2022-01-13 ENCOUNTER — Encounter: Payer: Self-pay | Admitting: Nurse Practitioner

## 2022-01-13 DIAGNOSIS — G44209 Tension-type headache, unspecified, not intractable: Secondary | ICD-10-CM

## 2022-01-13 DIAGNOSIS — Z7689 Persons encountering health services in other specified circumstances: Secondary | ICD-10-CM

## 2022-01-13 MED ORDER — FLUCONAZOLE 150 MG PO TABS: ORAL_TABLET | ORAL | 0 refills | Status: DC

## 2022-01-14 ENCOUNTER — Encounter: Payer: Self-pay | Admitting: Nurse Practitioner

## 2022-01-14 DIAGNOSIS — R519 Headache, unspecified: Secondary | ICD-10-CM

## 2022-01-16 NOTE — Telephone Encounter (Signed)
Nurses-I am uncertain that neurology will handle TMJ issues but as for sleep issues Dr. Rexene Alberts with [facility] would be a good choice she does sleep medicine  May go ahead with referral

## 2022-02-04 ENCOUNTER — Ambulatory Visit: Payer: 59 | Admitting: Nurse Practitioner

## 2022-02-04 VITALS — BP 138/79 | HR 72 | Temp 97.9°F | Wt 197.2 lb

## 2022-02-04 DIAGNOSIS — J069 Acute upper respiratory infection, unspecified: Secondary | ICD-10-CM

## 2022-02-04 DIAGNOSIS — H1131 Conjunctival hemorrhage, right eye: Secondary | ICD-10-CM | POA: Diagnosis not present

## 2022-02-04 NOTE — Progress Notes (Unsigned)
   Subjective:    Patient ID: Emily Braun, female    DOB: 1975-12-08, 46 y.o.   MRN: [id number]  HPI  Patient has a subconjunctival hemorrhage in right eye. Feels like some pressure if she really thinks about it.  Review of Systems     Objective:   Physical Exam        Assessment & Plan:

## 2022-02-04 NOTE — Patient Instructions (Signed)
Subconjunctival Hemorrhage Subconjunctival hemorrhage is bleeding that happens between the white part of your eye (sclera) and the clear membrane that covers the outside of your eye (conjunctiva). There are many tiny blood vessels near the surface of your eye. A subconjunctival hemorrhage happens when one or more of these vessels breaks and bleeds, causing a red patch to appear on your eye. This is similar to a bruise. Depending on the amount of bleeding, the red patch may only cover a small area of your eye or it may cover the entire visible part of the sclera. If a lot of blood collects under the conjunctiva, there may also be swelling. Subconjunctival hemorrhages do not affect your vision or cause pain, but your eye may feel irritated if there is swelling. Subconjunctival hemorrhages usually do not require treatment, and they usually disappear on their own within two to four weeks. What are the causes? This condition may be caused by: Mild trauma, such as rubbing your eye too hard. Blunt injuries, such as from playing sports or coming into contact with a deployed airbag. Coughing, sneezing, or vomiting. Straining, such as when lifting a heavy object. Medical conditions, such as: High blood pressure. Diabetes. Recent eye surgery. Certain medicines, especially blood thinners (anticoagulants), including aspirin. Other conditions, such as eye tumors, bleeding disorders, or blood vessel abnormalities. Subconjunctival hemorrhages can also happen without an obvious cause. What are the signs or symptoms? Symptoms of this condition include: A bright red or dark red patch on the white part of the eye. The red area may: Spread out to cover a larger area of the eye before it goes away. Turn colors such as pink or brownish-yellow before it goes away. Swelling around the eye. Mild eye irritation. How is this diagnosed? This condition is diagnosed with a physical exam. If your subconjunctival hemorrhage  was caused by trauma, your health care provider may refer you to an eye specialist (ophthalmologist) or another specialist to check for other injuries. You may have other tests, including: An eye exam including a vision test, checking your eye with a type of microscope (slit lamp) and measuring the pressure in your eye. Your eye may be dilated, especially if your subconjunctival hemorrhage was caused by trauma. A blood pressure check. Blood tests to check for bleeding disorders. If your subconjunctival hemorrhage was caused by trauma, X-rays or a CT scan may be done to check for other injuries. How is this treated? Usually, treatment is not needed for this condition. If you have discomfort, your health care provider may recommend eye drops or cold compresses. Follow these instructions at home: Take over-the-counter and prescription medicines only as directed by your health care provider. Use eye drops or cold compresses to help with discomfort as directed by your health care provider. Avoid activities, things, and environments that may irritate or injure your eye. Keep all follow-up visits. This is important. Contact a health care provider if: You have pain in your eye. The bleeding does not go away within 4 weeks. You keep getting new subconjunctival hemorrhages. Get help right away if: Your vision changes, you have difficulty seeing, or you develop double vision. You suddenly develop severe sensitivity to light. You develop a severe headache, persistent vomiting, confusion, or abnormal tiredness (lethargy). Your eye seems to bulge or protrude from your eye socket. You develop unexplained bruises on your body. You have unexplained bleeding in another area of your body. These symptoms may represent a serious problem that is an emergency. Do not   wait to see if the symptoms will go away. Get medical help right away. Call your local emergency services (911 in the [place] Do not drive yourself to  the hospital. Summary Subconjunctival hemorrhage is bleeding that happens between the white part of your eye and the clear membrane that covers the outside of your eye. This condition is similar to a bruise. Subconjunctival hemorrhages usually do not require treatment, and they usually disappear on their own within two to four weeks. Use eye drops or cold compresses to help with discomfort as directed by your health care provider. This information is not intended to replace advice given to you by your health care provider. Make sure you discuss any questions you have with your health care provider. Document Revised: 10/21/2020 Document Reviewed: 10/21/2020 Elsevier Patient Education  2023 Elsevier Inc.  

## 2022-02-05 ENCOUNTER — Encounter: Payer: Self-pay | Admitting: Nurse Practitioner

## 2022-02-18 ENCOUNTER — Ambulatory Visit: Payer: 59 | Admitting: Family Medicine

## 2022-02-18 ENCOUNTER — Other Ambulatory Visit: Payer: Self-pay | Admitting: Nurse Practitioner

## 2022-02-18 VITALS — BP 119/83 | HR 83 | Temp 97.5°F | Ht 62.5 in | Wt 196.4 lb

## 2022-02-18 DIAGNOSIS — E119 Type 2 diabetes mellitus without complications: Secondary | ICD-10-CM

## 2022-02-18 DIAGNOSIS — Z794 Long term (current) use of insulin: Secondary | ICD-10-CM

## 2022-02-18 DIAGNOSIS — E785 Hyperlipidemia, unspecified: Secondary | ICD-10-CM

## 2022-02-18 DIAGNOSIS — R519 Headache, unspecified: Secondary | ICD-10-CM | POA: Diagnosis not present

## 2022-02-18 DIAGNOSIS — E1169 Type 2 diabetes mellitus with other specified complication: Secondary | ICD-10-CM

## 2022-02-18 DIAGNOSIS — I10 Essential (primary) hypertension: Secondary | ICD-10-CM | POA: Diagnosis not present

## 2022-02-18 MED ORDER — TIRZEPATIDE 5 MG/0.5ML ~~LOC~~ SOAJ
5.0000 mg | SUBCUTANEOUS | 2 refills | Status: DC
Start: 2022-02-18 — End: 2022-03-08

## 2022-02-19 ENCOUNTER — Other Ambulatory Visit: Payer: Self-pay | Admitting: Family Medicine

## 2022-03-07 ENCOUNTER — Encounter: Payer: Self-pay | Admitting: Family Medicine

## 2022-03-07 ENCOUNTER — Ambulatory Visit (HOSPITAL_COMMUNITY)
Admission: RE | Admit: 2022-03-07 | Discharge: 2022-03-07 | Disposition: A | Discharge status: Home / Self Care | Payer: 59 | Source: Ambulatory Visit | Attending: Family Medicine | Admitting: Family Medicine

## 2022-03-07 DIAGNOSIS — R519 Headache, unspecified: Secondary | ICD-10-CM | POA: Insufficient documentation

## 2022-03-07 NOTE — Telephone Encounter (Signed)
Nurses Please increase the dose of Mounjaro New dose 7.5 mg weekly 1 month supply with 2 refills Patient to give [place] feedback in  3 weeks You may share this message with Craig If she has any setbacks or problems for her to let us know

## 2022-03-08 MED ORDER — TIRZEPATIDE 7.5 MG/0.5ML ~~LOC~~ SOAJ: 7.5000 mg | SUBCUTANEOUS | 2 refills | Status: DC

## 2022-04-02 ENCOUNTER — Other Ambulatory Visit: Payer: Self-pay | Admitting: Family Medicine

## 2022-04-05 ENCOUNTER — Encounter: Payer: Self-pay | Admitting: Neurology

## 2022-04-05 ENCOUNTER — Ambulatory Visit: Payer: 59 | Admitting: Neurology

## 2022-04-05 VITALS — BP 131/84 | HR 87 | Ht 62.5 in | Wt 187.0 lb

## 2022-04-05 DIAGNOSIS — G4733 Obstructive sleep apnea (adult) (pediatric): Secondary | ICD-10-CM | POA: Diagnosis not present

## 2022-04-05 DIAGNOSIS — G5603 Carpal tunnel syndrome, bilateral upper limbs: Secondary | ICD-10-CM | POA: Diagnosis not present

## 2022-04-05 DIAGNOSIS — G43009 Migraine without aura, not intractable, without status migrainosus: Secondary | ICD-10-CM

## 2022-04-05 MED ORDER — RIZATRIPTAN BENZOATE 10 MG PO TBDP: 10.0000 mg | ORAL_TABLET | ORAL | 11 refills | Status: AC | PRN

## 2022-04-05 NOTE — Progress Notes (Signed)
[facility]  PATIENT: Emily Braun DOB: 05-07-1976  REFERRING DOCTOR OR PCP: Sallee Lange SOURCE: Patient, notes from primary care, imaging reports.  _________________________________   HISTORICAL  CHIEF COMPLAINT:  Chief Complaint  Patient presents with   Consult    Rm 1, alone. Here for sleep consult and migraines. Pt was seen at the ED on 5/6, got a cocktail and then the following day went to urgent care. Had an MRI of the brain. Ha possible came from her TMJ, was using a dental device at the time that could be what flared up her TMJ. Pt c/o of snoring and feeling sleepy throughout the day.     HISTORY OF PRESENT ILLNESS:  Update 04/05/2022: Emily Braun is a 46 year old woman I previously saw for CTS.   She was diagnosed with OSA 04/2021 and has worn a mouth guard.   She stopped the mouthguard after a severe migraine last October.   She had slep better with the oral appliance but had jaw pain every morning and eating became uncomfortable.  She was reluctant to use CPAP as she has a 63 month old and is concerned about hearing the child.   Study was done through [facility] [place] (Dr. Johney [place] (DDS))  In April 2023, she was diagnosed with a severe migraine and went to the ED and had an IV  'cocktail' IV She was worse the next day and went to the urgent care and received a shot.  She was better the next day.   This HA had been associated with nausea and photophobia.   She continues to have 2-3 headaches days/month.   She takes Advil when a migraine occurs.     When he went to her PCP she had some EOM misalignment but no diplopia.   She had an MRI of the brain 03/07/2022 (normal, I concur).    I previousl saw her for CTS.  NCV showed moderate right and mild left CTS.   She had right CTR nd an injeciton on the left at the same time and is much better,    Impression: This NCV/EMG study shows neurophysiologic evidence of: 1.   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 2.   Mild median neuropathy across the left wrist 3.   Probable mild left C8 and possible minimal left C5 or C6 chronic radiculopathies   REVIEW OF SYSTEMS: Constitutional: No fevers, chills, sweats, or change in appetite Eyes: No visual changes, double vision, eye pain Ear, nose and throat: No hearing loss, ear pain, nasal congestion, sore throat Cardiovascular: No chest pain, palpitations Respiratory:  No shortness of breath at rest or with exertion.   No wheezes GastrointestinaI: No nausea, vomiting, diarrhea.   she has GERD.   Genitourinary:  No dysuria.  She has urinary frequency. Musculoskeletal:  No neck pain, back pain Integumentary: No rash, pruritus, skin lesions Neurological: as above Psychiatric: No depression at this time.  No anxiety Endocrine: No palpitations, diaphoresis, change in appetite, change in weigh or increased thirst Hematologic/Lymphatic:  No anemia, purpura, petechiae. Allergic/Immunologic: No itchy/runny eyes, nasal congestion, recent allergic reactions, rashes  ALLERGIES: Allergies  Allergen Reactions   Bydureon [Exenatide] Itching    Caused knots in her skin as well as itching at the site of injection   Metformin And Related Diarrhea   Tradjenta [Linagliptin]     Patient relates abdominal cramps and diarrhea    HOME MEDICATIONS:  Current Outpatient Medications:  Calcium Carbonate-Vitamin D (CALCIUM + D PO), Take 600 mg by mouth daily., Disp: , Rfl:    citalopram (CELEXA) 20 MG tablet, Take 1 tablet (20 mg total) by mouth daily., Disp: 90 tablet, Rfl: 1   fluticasone (FLONASE) 50 MCG/ACT nasal spray, Use 2 spray(s) in each nostril once daily, Disp: 16 g, Rfl: 0   insulin glargine-yfgn (SEMGLEE, YFGN,) 100 UNIT/ML Pen, INJECT 28 UNITS INTO THE SKIN AT BEDTIME MAY TITRATE UP TO 55 UNITS, Disp: 15 mL, Rfl: 5   LANTUS SOLOSTAR 100 UNIT/ML Solostar Pen, Inject 20 Units into the skin., Disp: , Rfl:    lisinopril  (ZESTRIL) 10 MG tablet, Take 1 tablet (10 mg total) by mouth daily., Disp: 90 tablet, Rfl: 1   loratadine (CLARITIN) 10 MG tablet, Take 10 mg by mouth at bedtime. , Disp: , Rfl:    naproxen (NAPROSYN) 500 MG tablet, Take 1 tablet (500 mg total) by mouth 2 (two) times daily with a meal. Prn headache, Disp: 30 tablet, Rfl: 0   norethindrone (MICRONOR) 0.35 MG tablet, Take 1 tablet by mouth once daily, Disp: 84 tablet, Rfl: 3   ondansetron (ZOFRAN) 4 MG tablet, Take 1 tablet (4 mg total) by mouth every 6 (six) hours., Disp: 12 tablet, Rfl: 0   oxybutynin (DITROPAN) 5 MG tablet, Take 5 mg by mouth 2 (two) times daily., Disp: , Rfl:    pravastatin (PRAVACHOL) 20 MG tablet, TAKE 1 TABLET BY MOUTH ONCE DAILY ON MONDAYS AND FRIDAYS, Disp: 24 tablet, Rfl: 3   rizatriptan (MAXALT-MLT) 10 MG disintegrating tablet, Take 1 tablet (10 mg total) by mouth as needed for migraine. May repeat in 2 hours if needed, Disp: 9 tablet, Rfl: 11   tirzepatide (MOUNJARO) 7.5 MG/0.5ML Pen, Inject 7.5 mg into the skin once a week., Disp: 6 mL, Rfl: 2   triamcinolone cream (KENALOG) 0.1 %, Apply 1 application topically 2 (two) times daily., Disp: 30 g, Rfl: 1  PAST MEDICAL HISTORY: Past Medical History:  Diagnosis Date   Adhesive capsulitis of left shoulder 06/02/2021   Anxiety    Arthritis    Carpal tunnel syndrome, left    Depression    Family history of adverse reaction to anesthesia    brother fights when wakes up   Fracture of humerus, proximal, left, closed 06/21/2020   GERD (gastroesophageal reflux disease)    Hypertension    followed by pcp   (06-25-2020 pt stated never had stress test)   IBS (irritable bowel syndrome)    Mixed stress and urge urinary incontinence    urologist--- dr Matilde Sprang   Seasonal allergies    Sleep apnea 06/02/2021   uses mouth guard no cpap used severe osa per sleep study 3 months ago   Type 2 diabetes mellitus treated with insulin ([place])    followed by pcp---  (06-25-2020 pt stated  she does not check her blood sugar at home)   Wears glasses     PAST SURGICAL HISTORY: Past Surgical History:  Procedure Laterality Date   COLONOSCOPY WITH PROPOFOL     02/22/2013  dr [name]   ESOPHAGOGASTRODUODENOSCOPY (EGD) WITH PROPOFOL     08-08-2016  dr Loletha Carrow   LAPAROSCOPIC CHOLECYSTECTOMY  09-10-2007  '@[facility]'$    MAXILLARY ANTROSTOMY Left 03/10/2014   Procedure: LEFT MAXILLARY ANTROSTOMY;  Surgeon: Ascencion Dike, MD;  Location: [place];  Service: ENT;  Laterality: Left;   ORIF HUMERUS FRACTURE Left 06/30/2020   Procedure: OPEN REDUCTION INTERNAL FIXATION (ORIF) PROXIMAL HUMERUS FRACTURE;  Surgeon: [name], MD;  Location: [facility];  Service: Orthopedics;  Laterality: Left;   ORIF PATELLA FRACTURE Left 2011   SHOULDER CLOSED REDUCTION Left 06/08/2021   Procedure: CLOSED MANIPULATION SHOULDER, WITH INJECTION;  Surgeon: [name], MD;  Location: [place];  Service: Orthopedics;  Laterality: Left;   WISDOM TOOTH EXTRACTION     yrs ago per pt on 06-02-2021    FAMILY HISTORY: Family History  Problem Relation Age of Onset   Diabetes Father    Hypertension Father    Diabetes Maternal Grandmother    Diabetes Paternal Grandmother    Hypertension Mother    Cancer - Other Brother        testicular   Esophageal cancer Neg Hx     SOCIAL HISTORY:  Social History   Socioeconomic History   Marital status: Single    Spouse name: Not on file   Number of children: 1   Years of education: Not on file   Highest education level: Not on file  Occupational History   Occupation: patient coordinator    Comment: [employer]  Tobacco Use   Smoking status: Never   Smokeless tobacco: Never  Vaping Use   Vaping Use: Never used  Substance and Sexual Activity   Alcohol use: Not Currently    Comment: socially   Drug use: Never   Sexual activity: Yes    Birth control/protection: None  Other Topics Concern    Not on file  Social History Narrative   Lives with foster-son   Caffeine use: sometimes   Right handed    Social Determinants of Health   Financial Resource Strain: Not on file  Food Insecurity: Not on file  Transportation Needs: Not on file  Physical Activity: Not on file  Stress: Not on file  Social Connections: Not on file  Intimate Partner Violence: Not on file     PHYSICAL EXAM  Vitals:   04/05/22 1432  BP: 131/84  Pulse: 87  Weight: 187 lb (84.8 kg)  Height: 5' 2.5" (1.588 m)    Body mass index is 33.66 kg/m.   General: The patient is well-developed and well-nourished and in no acute distress.  Mallampati 2.  Neck circ = 16.5 inches.  Normal fundoscopic exam.     Neck:  The neck is nontender with good range of motion  Extremities: She has a right carpal tunnel release scar from surgery since her last visit   Neurologic Exam  Mental status: The patient is alert and oriented x 3 at the time of the examination.    Speech is normal.  Cranial nerves: Extraocular movements are full.    Motor:  Muscle bulk is normal.   Tone is normal. Strength is  5 / 5 in all 4 extremities including right abductor pollicis brevis.   Sensory: Sensory testing is intact to pinprick, soft touch and vibration sensation in the arms now  Gait and station: Station is normal.   Gait is normal.   Reflexes: Deep tendon reflexes are symmetric and normal in the arms  Other: Tinel's signs were present at both wrist and Phalen's sign was present on the right      ASSESSMENT AND PLAN  1. OSA (obstructive sleep apnea)   2. Migraine without aura and without status migrainosus, not intractable   3. Bilateral carpal tunnel syndrome       1.   For migraines - treat with OTC Advil or rizatriptan  if more intense.   For tension type will take Advil.   If they occur more frequently, consider zonisamide for migraine prophylaxis.  This could also help with weight loss. 2.   For OSA, try  weight loss.  She has just started [facility] so hopefully this will help.  We will try to get HST results.   If unable to lose weight consider APAP.    3.   She can be seen as needed and is advised to call us back if she is unable to lose weight so that CPAP could be considered.  She is advised to let us know if she has any new or worsening neurologic symptoms.  45-minute office visit with the majority of the time spent face-to-face for history and physical, discussion/counseling and decision-making.  Additional time with record review and documentation.  Woods Gangemi A. Felecia Shelling, MD, PhD, Charlynn Grimes 0/0/3491, 7:91 PM Certified in Neurology, Clinical Neurophysiology, Sleep Medicine and Neuroimaging  [facility] [place] [facility] [address], [place], [place] [address] [phone]

## 2022-04-07 ENCOUNTER — Telehealth: Payer: Self-pay | Admitting: *Deleted

## 2022-04-07 NOTE — Telephone Encounter (Signed)
Faxed back below form to Raliegh Ip, received fax confirmation.

## 2022-04-11 ENCOUNTER — Encounter: Payer: Self-pay | Admitting: Family Medicine

## 2022-04-11 DIAGNOSIS — Z79899 Other long term (current) drug therapy: Secondary | ICD-10-CM

## 2022-04-11 DIAGNOSIS — E119 Type 2 diabetes mellitus without complications: Secondary | ICD-10-CM

## 2022-04-11 DIAGNOSIS — E1169 Type 2 diabetes mellitus with other specified complication: Secondary | ICD-10-CM

## 2022-04-11 DIAGNOSIS — I10 Essential (primary) hypertension: Secondary | ICD-10-CM

## 2022-04-11 NOTE — Telephone Encounter (Signed)
Nurses-May go up on Mounjaro.  10 mg once weekly.  1 month supply with 2 refills. Emily Braun can give [place] an update in approximately a month on whether or not she wants to stick with this dose or go 1 more step up.  In my opinion I would recommend staying at this dose until we do her follow-up in approximately 6 weeks thanks-Dr. Nicki Reaper  Also I would recommend doing an Z8T metabolic 7 lipid and liver at that visit-please get the labs drawn several days before that visit so we will have results to discuss thank you

## 2022-04-12 ENCOUNTER — Other Ambulatory Visit: Payer: Self-pay

## 2022-04-12 DIAGNOSIS — E119 Type 2 diabetes mellitus without complications: Secondary | ICD-10-CM

## 2022-04-12 MED ORDER — TIRZEPATIDE 10 MG/0.5ML ~~LOC~~ SOAJ: 10.0000 mg | SUBCUTANEOUS | 2 refills | Status: DC

## 2022-04-28 ENCOUNTER — Inpatient Hospital Stay (HOSPITAL_COMMUNITY): Admission: RE | Admit: 2022-04-28 | Payer: 59 | Source: Ambulatory Visit

## 2022-04-29 NOTE — Progress Notes (Signed)
Sent message, via epic in basket, requesting order in epic from surgeon     04/29/22 1545  Preop Orders  Has preop orders? No  Name of staff/physician contacted for orders(Indicate phone or IB message) C. Mcbane. PA-C.

## 2022-05-04 NOTE — Patient Instructions (Addendum)
DUE TO SPACE LIMITATIONS, ONLY TWO VISITORS  (aged 45 and older) ARE ALLOWED TO COME WITH YOU AND STAY IN THE WAITING ROOM DURING YOUR PRE OP AND PROCEDURE.   **NO VISITORS ARE ALLOWED IN THE SHORT STAY AREA OR RECOVERY ROOM!!**  You are not required to quarantine at this time prior to your surgery. However, you must do this: Hand Hygiene often Do NOT share personal items Notify your provider if you are in close contact with someone who has COVID or you develop fever 100.4 or greater, new onset of sneezing, cough, sore throat, shortness of breath or body aches.       Your procedure is scheduled on:   Wednesday  May 11, 2022  Report to [facility] Main Entrance.  Report to admitting at:  08:15   AM  +++++Call this number if you have any questions or problems the morning of surgery [phone]  Do not eat food :After Midnight the night prior to your surgery/procedure.  After Midnight you may have the following liquids until  07:45 AM DAY OF SURGERY  Clear Liquid Diet Water Black Coffee (sugar ok, NO MILK/CREAM OR CREAMERS)  Tea (sugar ok, NO MILK/CREAM OR CREAMERS) regular and decaf                             Plain Jell-O (NO RED)                                           Fruit ices (not with fruit pulp, NO RED)                                     Popsicles (NO RED)                                                                  Juice: apple, WHITE grape, WHITE cranberry Sports drinks like Gatorade (NO RED)                     The day of surgery:  Drink ONE (1) Pre-Surgery G2 at    07:45 AM the morning of surgery. Drink in one sitting. Do not sip.  This drink was given to you during your hospital pre-op appointment visit. Nothing else to drink after completing the Pre-Surgery G2 : No candy, chewing or throat lozenges.    FOLLOW ANY ADDITIONAL PRE OP INSTRUCTIONS YOU RECEIVED FROM YOUR SURGEON'S OFFICE!!!   Oral Hygiene is also important to reduce your risk  of infection.        Remember - BRUSH YOUR TEETH THE MORNING OF SURGERY WITH YOUR REGULAR TOOTHPASTE   Take ONLY these medicines the morning of surgery with A SIP OF WATER: Citalopram (Celexa), Norethindrone (BCPs), Oxybutynin (Ditropan), Flonase spray.  If needed you may take rizatriptan (Maxalt).    DIABETIC MEDICATIONS:   Darcel Bayley- do not inject this on the day of your surgery (Last injection 05-04-22)  Resume injections the week after surgery.  Lantus - the night before surgery, take 50% of usual dose (10 units)                    You may not have any metal on your body including hair pins, jewelry, and body piercing  Do not wear make-up, lotions, powders, perfumes or deodorant  Do not wear nail polish including gel and S&S, artificial / acrylic nails, or any other type of covering on natural nails including finger and toenails. If you have artificial nails, gel coating, etc., that needs to be removed by a nail salon, Please have this removed prior to surgery. Not doing so may mean that your surgery could be cancelled or delayed if the Surgeon or anesthesia staff feels like they are unable to monitor you safely.   Do not shave 48 hours prior to surgery to avoid nicks in your skin which may contribute to postoperative infections.    DO NOT [place]. PHARMACY WILL DISPENSE MEDICATIONS LISTED ON YOUR MEDICATION LIST TO YOU DURING YOUR ADMISSION [place]!   Patients discharged on the day of surgery will not be allowed to drive home.  Someone NEEDS to stay with you for the first 24 hours after anesthesia.  Special Instructions: Bring a copy of your healthcare power of attorney and living will documents the day of surgery, if you wish to have them scanned into your [facility] Records- EPIC  Please read over the following fact sheets you were given: IF YOU HAVE QUESTIONS ABOUT YOUR PRE-OP  INSTRUCTIONS, PLEASE CALL [phone]  ([place])   [place] - Preparing for Surgery Before surgery, you can play an important role.  Because skin is not sterile, your skin needs to be as free of germs as possible.  You can reduce the number of germs on your skin by washing with CHG (chlorahexidine gluconate) soap before surgery.  CHG is an antiseptic cleaner which kills germs and bonds with the skin to continue killing germs even after washing. Please DO NOT use if you have an allergy to CHG or antibacterial soaps.  If your skin becomes reddened/irritated stop using the CHG and inform your nurse when you arrive at Short Stay. Do not shave (including legs and underarms) for at least 48 hours prior to the first CHG shower.  You may shave your face/neck.  Please follow these instructions carefully:  1.  Shower with CHG Soap the night before surgery and the  morning of surgery.  2.  If you choose to wash your hair, wash your hair first as usual with your normal  shampoo.  3.  After you shampoo, rinse your hair and body thoroughly to remove the shampoo.                             4.  Use CHG as you would any other liquid soap.  You can apply chg directly to the skin and wash.  Gently with a scrungie or clean washcloth.  5.  Apply the CHG Soap to your body ONLY FROM THE NECK DOWN.   Do not use on face/ open                           Wound or open sores. Avoid contact with eyes, ears mouth and genitals (private parts).  Wash face,  Genitals (private parts) with your normal soap.             6.  Wash thoroughly, paying special attention to the area where your  surgery  will be performed.  7.  Thoroughly rinse your body with warm water from the neck down.  8.  DO NOT shower/wash with your normal soap after using and rinsing off the CHG Soap.            9.  Pat yourself dry with a clean towel.            10.  Wear clean pajamas.            11.  Place clean sheets on your bed the  night of your first shower and do not  sleep with pets.  ON THE DAY OF SURGERY : Do not apply any lotions/deodorants the morning of surgery.  Please wear clean clothes to the hospital/surgery center.     Preparing for Total Shoulder Arthroplasty ================================================================= Please follow these instructions carefully, in addition to any other special Bathing information that was explained to you at the Presurgical Appointment:  BENZOYL PEROXIDE 5% GEL: Used to kill bacteria on the skin which could cause an infection at the surgery site.   Please do not use if you have an allergy to benzoyl peroxide. If your skin becomes reddened/irritated stop using the benzoyl peroxide and inform your Doctor.   Starting two days before surgery, apply as follows:  1. Apply benzoyl peroxide gel in the morning and at night. Apply after taking a shower. If you are not taking a shower, clean entire shoulder front, back, and side, along with the armpit with a clean wet washcloth.  2. Place a quarter-sized dollop of the gel on your SHOULDER and rub in thoroughly, making sure to cover the front, back, and side of your shoulder, along with the armpit.   2 Days prior to Surgery     Monday, 05-09-22 First Dose  _______ Morning Second Dose  _______ Night  Day Before Surgery       Tuesday  05-10-22 First Dose  ______ Morning  On the night before surgery, wash your entire body (except hair, face and private areas) with CHG Soap. THEN, rub in the LAST application of the Benzoyl Peroxide Gel on your shoulder.   3. On the Morning of Surgery wash your BODY AGAIN with CHG Soap (except hair, face and private areas)  4. DO NOT USE THE BENZOYL PEROXIDE GEL ON THE DAY OF YOUR SURGERY        FAILURE TO FOLLOW THESE INSTRUCTIONS MAY RESULT IN THE CANCELLATION OF YOUR SURGERY  PATIENT SIGNATURE_________________________________  NURSE  SIGNATURE__________________________________  ________________________________________________________________________           Adam Phenix    An incentive spirometer is a tool that can help keep your lungs clear and active. This tool measures how well you are filling your lungs with each breath. Taking long deep breaths may help reverse or decrease the chance of developing breathing (pulmonary) problems (especially infection) following: A long period of time when you are unable to move or be active. BEFORE THE PROCEDURE  If the spirometer includes an indicator to show your best effort, your nurse or respiratory therapist will set it to a desired goal. If possible, sit up straight or lean slightly forward. Try not to slouch. Hold the incentive spirometer in an upright position. INSTRUCTIONS FOR USE  Sit on the edge  of your bed if possible, or sit up as far as you can in bed or on a chair. Hold the incentive spirometer in an upright position. Breathe out normally. Place the mouthpiece in your mouth and seal your lips tightly around it. Breathe in slowly and as deeply as possible, raising the piston or the ball toward the top of the column. Hold your breath for 3-5 seconds or for as long as possible. Allow the piston or ball to fall to the bottom of the column. Remove the mouthpiece from your mouth and breathe out normally. Rest for a few seconds and repeat Steps 1 through 7 at least 10 times every 1-2 hours when you are awake. Take your time and take a few normal breaths between deep breaths. The spirometer may include an indicator to show your best effort. Use the indicator as a goal to work toward during each repetition. After each set of 10 deep breaths, practice coughing to be sure your lungs are clear. If you have an incision (the cut made at the time of surgery), support your incision when coughing by placing a pillow or rolled up towels firmly against it. Once you are  able to get out of bed, walk around indoors and cough well. You may stop using the incentive spirometer when instructed by your caregiver.  RISKS AND COMPLICATIONS Take your time so you do not get dizzy or light-headed. If you are in pain, you may need to take or ask for pain medication before doing incentive spirometry. It is harder to take a deep breath if you are having pain. AFTER USE Rest and breathe slowly and easily. It can be helpful to keep track of a log of your progress. Your caregiver can provide you with a simple table to help with this. If you are using the spirometer at home, follow these instructions: [place] IF:  You are having difficultly using the spirometer. You have trouble using the spirometer as often as instructed. Your pain medication is not giving enough relief while using the spirometer. You develop fever of 100.5 F (38.1 C) or higher.                                                                                                    SEEK IMMEDIATE MEDICAL CARE IF:  You cough up bloody sputum that had not been present before. You develop fever of 102 F (38.9 C) or greater. You develop worsening pain at or near the incision site. MAKE SURE YOU:  Understand these instructions. Will watch your condition. Will get help right away if you are not doing well or get worse. Document Released: 12/26/2006 Document Revised: 11/07/2011 Document Reviewed: 02/26/2007 [facility] Patient Information 2014 [place], [place].

## 2022-05-04 NOTE — Progress Notes (Addendum)
COVID Vaccine received:  '[]'$  No '[x]'$  Yes Date of any COVID positive Test in last 90 days: None  PCP - Sallee Lange, MD Cardiologist - none Neurologist - Arlice Colt, MD      Clearance form  04-07-22  Telephone note  Chest x-ray - 06-20-2020  Epic EKG -  06-08-2021  Epic Stress Test - n/a ECHO - n/a Cardiac Cath - n/a  Pacemaker/ICD device     '[x]'$  N/A Spinal Cord Stimulator:'[x]'$  No '[]'$  Yes      Other Implants:   History of Sleep Apnea? '[]'$  No '[x]'$  Yes   Sleep Study Date:  04-2021   CPAP used?- '[x]'$  No '[]'$  Yes  (Instruct to bring their mask & Tubing)    Patient doesn't use anything.   Does the patient monitor blood sugar? '[x]'$  No '[]'$  Yes  '[]'$  N/A  Patient does not monitor her BS, she just watches her diet.  Does patient have a [employer] or Dexacom? '[x]'$  No '[]'$  Yes   Fasting Blood Sugar Ranges-    Checks Blood Sugar __0___ times a day  Blood Thinner Instructions:  None Aspirin Instructions: none Last Dose:  ERAS Protocol Ordered: '[]'$  No  '[x]'$  Yes PRE-SURGERY '[]'$  ENSURE  '[x]'$  G2   Comments: Has hx of severe Migraine  Activity level: Patient can climb a flight of stairs without difficulty;  '[x]'$  No CP  '[x]'$  No SOB,    Anesthesia review: Brother wakes up aggressive/ fighting, DM2, HTN  Patient denies shortness of breath, fever, cough and chest pain at PAT appointment.  Patient verbalized understanding and agreement to the Pre-Surgical Instructions that were given to them at this PAT appointment. Patient was also educated of the need to review these PAT instructions again prior to his/her surgery.I reviewed the appropriate phone numbers to call if they have any and questions or concerns.

## 2022-05-06 ENCOUNTER — Other Ambulatory Visit: Payer: Self-pay

## 2022-05-06 ENCOUNTER — Encounter (HOSPITAL_COMMUNITY)
Admission: RE | Admit: 2022-05-06 | Discharge: 2022-05-06 | Disposition: A | Discharge status: Home / Self Care | Payer: 59 | Source: Ambulatory Visit | Attending: Orthopaedic Surgery | Admitting: Orthopaedic Surgery

## 2022-05-06 ENCOUNTER — Encounter (HOSPITAL_COMMUNITY): Payer: Self-pay

## 2022-05-06 VITALS — BP 140/90 | HR 68 | Temp 98.7°F | Resp 16 | Ht 63.0 in | Wt 186.0 lb

## 2022-05-06 DIAGNOSIS — Z01818 Encounter for other preprocedural examination: Secondary | ICD-10-CM | POA: Diagnosis present

## 2022-05-06 DIAGNOSIS — I10 Essential (primary) hypertension: Secondary | ICD-10-CM

## 2022-05-06 DIAGNOSIS — E119 Type 2 diabetes mellitus without complications: Secondary | ICD-10-CM

## 2022-05-06 HISTORY — DX: Prediabetes: R73.03

## 2022-05-06 HISTORY — DX: Malignant (primary) neoplasm, unspecified: C80.1

## 2022-05-06 HISTORY — DX: Personal history of other diseases of the digestive system: Z87.19

## 2022-05-06 HISTORY — DX: Headache, unspecified: R51.9

## 2022-05-06 LAB — CBC
HCT: 46 % (ref 36.0–46.0)
Hemoglobin: 15.1 g/dL — ABNORMAL HIGH (ref 12.0–15.0)
MCH: 29.5 pg (ref 26.0–34.0)
MCHC: 32.8 g/dL (ref 30.0–36.0)
MCV: 89.8 fL (ref 80.0–100.0)
Platelets: 287 10*3/uL (ref 150–400)
RBC: 5.12 MIL/uL — ABNORMAL HIGH (ref 3.87–5.11)
RDW: 12.9 % (ref 11.5–15.5)
WBC: 6.2 10*3/uL (ref 4.0–10.5)
nRBC: 0 % (ref 0.0–0.2)

## 2022-05-06 LAB — HEMOGLOBIN A1C
Hgb A1c MFr Bld: 5.5 % (ref 4.8–5.6)
Mean Plasma Glucose: 111.15 mg/dL

## 2022-05-06 LAB — BASIC METABOLIC PANEL
Anion gap: 8 (ref 5–15)
BUN: 13 mg/dL (ref 6–20)
CO2: 23 mmol/L (ref 22–32)
Calcium: 9.1 mg/dL (ref 8.9–10.3)
Chloride: 105 mmol/L (ref 98–111)
Creatinine, Ser: 0.62 mg/dL (ref 0.44–1.00)
GFR, Estimated: >60 mL/min (ref >60)
Glucose, Bld: 96 mg/dL (ref 70–99)
Potassium: 3.9 mmol/L (ref 3.5–5.1)
Sodium: 136 mmol/L (ref 135–145)

## 2022-05-06 LAB — SURGICAL PCR SCREEN
MRSA, PCR: POSITIVE — AB
Staphylococcus aureus: POSITIVE — AB

## 2022-05-06 LAB — GLUCOSE, CAPILLARY: Glucose-Capillary: 96 mg/dL (ref 70–99)

## 2022-05-06 NOTE — Progress Notes (Signed)
Patient's PCR screen is positive for both MRSA and STAPH. Appropriate notes have been placed on the patient's chart. This note has been routed to Dr. Griffin Basil for review. The Patient's surgery is currently Scheduled [id number]  05-11-2022  Leota Jacobsen, BSN, CVRN-BC   Pre-Surgical Testing Nurse [place]  [phone]

## 2022-05-07 NOTE — H&P (Signed)
PREOPERATIVE H&P  Chief Complaint: hardware complications, DJD shoulder  HPI: Emily Braun is a 46 y.o. female who is scheduled for Procedure(s): REVERSE SHOULDER ARTHROPLASTY HARDWARE REMOVAL.   Patient has a past medical history significant for HTN, IBS, sleep apnea, GERD.   Emily Braun is a 46 year old who in November 2021 had an open reduction and internal fixation of a three part proximal humerus fracture. She went on to union, however she has continued stiffness and has pain as well. She had a CT afterwards in the [facility] system in the early part of 2022. She has been back to work full duties. 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 She is right hand dominant at baseline. She has pain with range of motion and has limited range of motion. She is not able to externally rotate actively. She is frustrated by her shoulder and wants to know if there is anything that can be done. She had routine healing afterwards, no sign of infection.  Symptoms are rated as moderate to severe, and have been worsening.  This is significantly impairing activities of daily living.    Please see clinic note for further details on this patient's care.    She has elected for surgical management.   Past Medical History:  Diagnosis Date   Adhesive capsulitis of left shoulder 06/02/2021   Anxiety    Arthritis    Cancer ([place])    skin cancer removed from back   Carpal tunnel syndrome, left    Depression    Family history of adverse reaction to anesthesia    brother fights when wakes up   Fracture of humerus, proximal, left, closed 06/21/2020   GERD (gastroesophageal reflux disease)    Headache    History of hiatal hernia    Hypertension    followed by pcp   (06-25-2020 pt stated never had stress test)   IBS (irritable bowel syndrome)    Mixed stress and urge urinary incontinence    urologist--- dr Matilde Sprang   Pre-diabetes    Seasonal allergies    Sleep apnea  06/02/2021   no cpap used osa per sleep study   Wears glasses    Past Surgical History:  Procedure Laterality Date   COLONOSCOPY WITH PROPOFOL     02/22/2013  dr [name]   ESOPHAGOGASTRODUODENOSCOPY (EGD) WITH PROPOFOL     08-08-2016  dr Loletha Carrow   LAPAROSCOPIC CHOLECYSTECTOMY  09-10-2007  '@[facility]'$    laparoscopic   MAXILLARY ANTROSTOMY Left 03/10/2014   Procedure: LEFT MAXILLARY ANTROSTOMY;  Surgeon: Ascencion Dike, MD;  Location: [place];  Service: ENT;  Laterality: Left;   ORIF HUMERUS FRACTURE Left 06/30/2020   Procedure: OPEN REDUCTION INTERNAL FIXATION (ORIF) PROXIMAL HUMERUS FRACTURE;  Surgeon: [name], MD;  Location: [place];  Service: Orthopedics;  Laterality: Left;   ORIF PATELLA FRACTURE Left 2011   SHOULDER CLOSED REDUCTION Left 06/08/2021   Procedure: CLOSED MANIPULATION SHOULDER, WITH INJECTION;  Surgeon: [name], MD;  Location: [place];  Service: Orthopedics;  Laterality: Left;   WISDOM TOOTH EXTRACTION     yrs ago per pt on 06-02-2021   Social History   Socioeconomic History   Marital status: Single    Spouse name: Not on file   Number of children: 1   Years of education: Not on file   Highest education level: Not on file  Occupational History   Occupation: patient coordinator  Comment: [employer] [place]  Tobacco Use   Smoking status: Never   Smokeless tobacco: Never  Vaping Use   Vaping Use: Never used  Substance and Sexual Activity   Alcohol use: Not Currently    Comment: socially   Drug use: Never   Sexual activity: Yes    Birth control/protection: None  Other Topics Concern   Not on file  Social History Narrative   Lives with foster-son   Caffeine use: sometimes   Right handed    Social Determinants of Health   Financial Resource Strain: Not on file  Food Insecurity: Not on file  Transportation Needs: Not on file  Physical Activity: Not on file  Stress: Not on file   Social Connections: Not on file   Family History  Problem Relation Age of Onset   Diabetes Father    Hypertension Father    Diabetes Maternal Grandmother    Diabetes Paternal Grandmother    Hypertension Mother    Cancer - Other Brother        testicular   Esophageal cancer Neg Hx    Allergies  Allergen Reactions   Bydureon [Exenatide] Itching    Caused knots in her skin as well as itching at the site of injection   Metformin And Related Diarrhea   Tradjenta [Linagliptin]     Patient relates abdominal cramps and diarrhea   Prior to Admission medications   Medication Sig Start Date End Date Taking? Authorizing Provider  Calcium Carbonate-Vitamin D (CALCIUM + D PO) Take 1 tablet by mouth daily.   Yes [provider]  citalopram (CELEXA) 20 MG tablet Take 1 tablet (20 mg total) by mouth daily. 10/22/21  Yes Kathyrn Drown, MD  fluticasone (FLONASE) 50 MCG/ACT nasal spray Use 2 spray(s) in each nostril once daily 04/04/22  Yes Luking, Scott A, MD  LANTUS SOLOSTAR 100 UNIT/ML Solostar Pen Inject 20 Units into the skin at bedtime. 01/27/22  Yes [provider]  lisinopril (ZESTRIL) 10 MG tablet Take 1 tablet (10 mg total) by mouth daily. 10/22/21  Yes Kathyrn Drown, MD  loratadine (CLARITIN) 10 MG tablet Take 10 mg by mouth at bedtime.    Yes [provider]  naproxen (NAPROSYN) 500 MG tablet Take 1 tablet (500 mg total) by mouth 2 (two) times daily with a meal. Prn headache Patient taking differently: Take 500 mg by mouth 2 (two) times daily as needed for headache. 01/07/22  Yes Nilda Simmer, NP  norethindrone (MICRONOR) 0.35 MG tablet Take 1 tablet by mouth once daily 06/09/21  Yes Pearson Forster C, NP  oxybutynin (DITROPAN) 5 MG tablet Take 5 mg by mouth 2 (two) times daily. 12/20/19  Yes [provider]  pantoprazole (PROTONIX) 40 MG tablet Take 40 mg by mouth at bedtime. 05/04/22  Yes [provider]  pravastatin (PRAVACHOL) 20 MG tablet  TAKE 1 TABLET BY MOUTH ONCE DAILY ON MONDAYS AND FRIDAYS 10/22/21  Yes Luking, Scott A, MD  rizatriptan (MAXALT-MLT) 10 MG disintegrating tablet Take 1 tablet (10 mg total) by mouth as needed for migraine. May repeat in 2 hours if needed 04/05/22  Yes Sater, Nanine Means, MD  tirzepatide [facility]) 5 MG/0.5ML Pen Inject 5 mg into the skin every Wednesday.   Yes [provider]  triamcinolone cream (KENALOG) 0.1 % Apply 1 application topically 2 (two) times daily. Patient taking differently: Apply 1 application  topically 2 (two) times daily as needed (irritation). 10/22/21  Yes Luking, Elayne Snare,  MD  ondansetron (ZOFRAN) 4 MG tablet Take 1 tablet (4 mg total) by mouth every 6 (six) hours. Patient not taking: Reported on 05/04/2022 01/01/22   Kem Parkinson, PA-C  tirzepatide [facility]) 10 MG/0.5ML Pen Inject 10 mg into the skin once a week. Patient not taking: Reported on 05/04/2022 04/12/22   Kathyrn Drown, MD    ROS: All other systems have been reviewed and were otherwise negative with the exception of those mentioned in the HPI and as above.  Physical Exam: General: Alert, no acute distress Cardiovascular: No pedal edema Respiratory: No cyanosis, no use of accessory musculature GI: No organomegaly, abdomen is soft and non-tender Skin: No lesions in the area of chief complaint Neurologic: Sensation intact distally Psychiatric: Patient is competent for consent with normal mood and affect Lymphatic: No axillary or cervical lymphadenopathy  MUSCULOSKELETAL:  On examination she has active forward elevation to about 90. Passive to 110 with a firm endpoint. External rotation to 30 passively. She has no active external rotation at both 90 degrees and at her side. Teres minor testing is obviously abnormal. There is 5-/5 supraspinatus and infraspinatus testing. Internal rotation to the back pocket.  Imaging: CT  in the [facility] system as well as X-rays, demonstrating a malunion versus osseous  overgrowth of the proximal humerus fracture with on the CT potentially prominent hardware to the articular surface.  BMI: Estimated body mass index is 32.95 kg/m as calculated from the following:   Height as of 05/06/22: '5\' 3"'$  (1.6 m).   Weight as of 05/06/22: 84.4 kg.    Diabetes:   Patient has a diagnosis of diabetes,  Lab Results  Component Value Date   HGBA1C 5.5 05/06/2022   Smoking Status:   reports that she has never smoked. She has never used smokeless tobacco.     Assessment: hardware complications, DJD shoulder  Plan: Plan for Procedure(s): REVERSE SHOULDER ARTHROPLASTY HARDWARE REMOVAL  At this point the patient is unhappy with her shoulder. We talked about the risks and benefits in terms of a conversion to a reverse total shoulder arthroplasty with long stem implants. We will likely remove the hardware, recontour the bone in the malunion and then try and use primary implants versus a revised stem.  She understands the specific risks including component longevity, stress fracture, dislocation and infection.  We will get cultures at the time of surgery.    The risks benefits and alternatives were discussed with the patient including but not limited to the risks of nonoperative treatment, versus surgical intervention including infection, bleeding, nerve injury,  blood clots, cardiopulmonary complications, morbidity, mortality, among others, and they were willing to proceed.   We additionally specifically discussed risks of axillary nerve injury, infection, periprosthetic fracture, continued pain and longevity of implants prior to beginning procedure.    Patient will be closely monitored in PACU for medical stabilization and pain control. If found stable in PACU, patient may be discharged home with outpatient follow-up. If any concerns regarding patient's stabilization patient will be admitted for observation after surgery. The patient is planning to be discharged home with  outpatient PT.   The patient acknowledged the explanation, agreed to proceed with the plan and consent was signed.   Operative Plan: Left reverse total shoulder arthroplasty with removal of hardware Discharge Medications: Standard DVT Prophylaxis: Aspirin Physical Therapy: Outpatient PT Special Discharge needs: Sling. Conner, PA-C  05/07/2022 1:56 PM

## 2022-05-10 NOTE — Anesthesia Preprocedure Evaluation (Signed)
Anesthesia Evaluation  Patient identified by MRN, date of birth, ID band Patient awake    Reviewed: Allergy & Precautions, NPO status , Patient's Chart, lab work & pertinent test results  History of Anesthesia Complications Negative for: history of anesthetic complications  Airway Mallampati: II  TM Distance: >3 FB Neck ROM: Full    Dental no notable dental hx. (+) Dental Advisory Given   Pulmonary sleep apnea ,    Pulmonary exam normal        Cardiovascular hypertension, Pt. on medications Normal cardiovascular exam     Neuro/Psych PSYCHIATRIC DISORDERS Anxiety Depression  Neuromuscular disease    GI/Hepatic Neg liver ROS, GERD  Medicated,  Endo/Other  diabetes, Type 2, Insulin Dependent  Renal/GU negative Renal ROS     Musculoskeletal  (+) Arthritis ,   Abdominal   Peds  Hematology   Anesthesia Other Findings   Reproductive/Obstetrics                            Anesthesia Physical  Anesthesia Plan  ASA: 2  Anesthesia Plan: General and Regional   Post-op Pain Management: Celebrex PO (pre-op)* and Tylenol PO (pre-op)*   Induction: Intravenous  PONV Risk Score and Plan: 3 and Ondansetron, Dexamethasone and Midazolam  Airway Management Planned: Oral ETT  Additional Equipment: None  Intra-op Plan:   Post-operative Plan: Extubation in OR  Informed Consent: I have reviewed the patients History and Physical, chart, labs and discussed the procedure including the risks, benefits and alternatives for the proposed anesthesia with the patient or authorized representative who has indicated his/her understanding and acceptance.     Dental advisory given  Plan Discussed with: Anesthesiologist and CRNA  Anesthesia Plan Comments:        Anesthesia Quick Evaluation

## 2022-05-11 ENCOUNTER — Ambulatory Visit (HOSPITAL_COMMUNITY): Payer: Worker's Compensation | Admitting: Certified Registered"

## 2022-05-11 ENCOUNTER — Other Ambulatory Visit: Payer: Self-pay

## 2022-05-11 ENCOUNTER — Encounter (HOSPITAL_COMMUNITY): Payer: Self-pay | Admitting: Orthopaedic Surgery

## 2022-05-11 ENCOUNTER — Ambulatory Visit (HOSPITAL_COMMUNITY): Payer: 59

## 2022-05-11 ENCOUNTER — Encounter (HOSPITAL_COMMUNITY)
Admission: RE | Disposition: A | Discharge status: Home / Self Care | Payer: Self-pay | Source: Home / Self Care | Attending: Orthopaedic Surgery

## 2022-05-11 ENCOUNTER — Ambulatory Visit (HOSPITAL_COMMUNITY)
Admission: RE | Admit: 2022-05-11 | Discharge: 2022-05-11 | Disposition: A | Discharge status: Home / Self Care | Payer: Worker's Compensation | Attending: Orthopaedic Surgery | Admitting: Orthopaedic Surgery

## 2022-05-11 ENCOUNTER — Ambulatory Visit (HOSPITAL_BASED_OUTPATIENT_CLINIC_OR_DEPARTMENT_OTHER): Payer: Worker's Compensation | Admitting: Certified Registered"

## 2022-05-11 DIAGNOSIS — I10 Essential (primary) hypertension: Secondary | ICD-10-CM | POA: Insufficient documentation

## 2022-05-11 DIAGNOSIS — G709 Myoneural disorder, unspecified: Secondary | ICD-10-CM | POA: Insufficient documentation

## 2022-05-11 DIAGNOSIS — S42262A Displaced fracture of lesser tuberosity of left humerus, initial encounter for closed fracture: Secondary | ICD-10-CM | POA: Insufficient documentation

## 2022-05-11 DIAGNOSIS — G4730 Sleep apnea, unspecified: Secondary | ICD-10-CM | POA: Diagnosis present

## 2022-05-11 DIAGNOSIS — K219 Gastro-esophageal reflux disease without esophagitis: Secondary | ICD-10-CM | POA: Diagnosis not present

## 2022-05-11 DIAGNOSIS — X58XXXA Exposure to other specified factors, initial encounter: Secondary | ICD-10-CM | POA: Insufficient documentation

## 2022-05-11 DIAGNOSIS — E119 Type 2 diabetes mellitus without complications: Secondary | ICD-10-CM | POA: Diagnosis not present

## 2022-05-11 DIAGNOSIS — Z794 Long term (current) use of insulin: Secondary | ICD-10-CM | POA: Diagnosis not present

## 2022-05-11 DIAGNOSIS — M87222 Osteonecrosis due to previous trauma, left humerus: Secondary | ICD-10-CM | POA: Diagnosis not present

## 2022-05-11 DIAGNOSIS — S42202P Unspecified fracture of upper end of left humerus, subsequent encounter for fracture with malunion: Secondary | ICD-10-CM | POA: Diagnosis not present

## 2022-05-11 DIAGNOSIS — T84098A Other mechanical complication of other internal joint prosthesis, initial encounter: Secondary | ICD-10-CM | POA: Diagnosis not present

## 2022-05-11 HISTORY — PX: HARDWARE REMOVAL: SHX979

## 2022-05-11 HISTORY — PX: REVERSE SHOULDER ARTHROPLASTY: SHX5054

## 2022-05-11 LAB — POCT PREGNANCY, URINE: Preg Test, Ur: NEGATIVE

## 2022-05-11 LAB — GLUCOSE, CAPILLARY
Glucose-Capillary: 111 mg/dL — ABNORMAL HIGH (ref 70–99)
Glucose-Capillary: 145 mg/dL — ABNORMAL HIGH (ref 70–99)

## 2022-05-11 SURGERY — ARTHROPLASTY, SHOULDER, TOTAL, REVERSE
Anesthesia: Regional | Site: Shoulder | Laterality: Left

## 2022-05-11 MED ORDER — ONDANSETRON HCL 4 MG/2ML IJ SOLN
INTRAMUSCULAR | Status: DC | PRN
  Administered 2022-05-11: 4 mg via INTRAVENOUS

## 2022-05-11 MED ORDER — OXYCODONE HCL 5 MG PO TABS: ORAL_TABLET | ORAL | 0 refills | Status: AC

## 2022-05-11 MED ORDER — STERILE WATER FOR IRRIGATION IR SOLN
Status: DC | PRN
  Administered 2022-05-11: 2000 mL

## 2022-05-11 MED ORDER — ACETAMINOPHEN 500 MG PO TABS
1000.0000 mg | ORAL_TABLET | Freq: Once | ORAL | Status: AC
Start: 2022-05-11 — End: 2022-05-11
  Administered 2022-05-11: 1000 mg via ORAL
  Filled 2022-05-11: qty 2

## 2022-05-11 MED ORDER — CELECOXIB 200 MG PO CAPS
200.0000 mg | ORAL_CAPSULE | Freq: Once | ORAL | Status: AC
  Administered 2022-05-11: 200 mg via ORAL
  Filled 2022-05-11: qty 1

## 2022-05-11 MED ORDER — DEXAMETHASONE SODIUM PHOSPHATE 10 MG/ML IJ SOLN
INTRAMUSCULAR | Status: AC
  Filled 2022-05-11: qty 1

## 2022-05-11 MED ORDER — FENTANYL CITRATE PF 50 MCG/ML IJ SOSY
50.0000 ug | PREFILLED_SYRINGE | INTRAMUSCULAR | Status: DC
  Administered 2022-05-11: 100 ug via INTRAVENOUS
  Filled 2022-05-11: qty 2

## 2022-05-11 MED ORDER — ROCURONIUM BROMIDE 10 MG/ML (PF) SYRINGE
PREFILLED_SYRINGE | INTRAVENOUS | Status: AC
  Filled 2022-05-11: qty 10

## 2022-05-11 MED ORDER — FENTANYL CITRATE (PF) 100 MCG/2ML IJ SOLN
INTRAMUSCULAR | Status: DC | PRN
  Administered 2022-05-11: 50 ug via INTRAVENOUS

## 2022-05-11 MED ORDER — PROPOFOL 10 MG/ML IV BOLUS
INTRAVENOUS | Status: DC | PRN
  Administered 2022-05-11: 160 mg via INTRAVENOUS

## 2022-05-11 MED ORDER — BUPIVACAINE HCL (PF) 0.5 % IJ SOLN
INTRAMUSCULAR | Status: DC | PRN
  Administered 2022-05-11: 15 mL via PERINEURAL

## 2022-05-11 MED ORDER — PHENYLEPHRINE HCL-NACL 20-0.9 MG/250ML-% IV SOLN
INTRAVENOUS | Status: DC | PRN
  Administered 2022-05-11: 20 ug/min via INTRAVENOUS

## 2022-05-11 MED ORDER — ONDANSETRON HCL 4 MG/2ML IJ SOLN
INTRAMUSCULAR | Status: AC
  Filled 2022-05-11: qty 2

## 2022-05-11 MED ORDER — FENTANYL CITRATE (PF) 100 MCG/2ML IJ SOLN
INTRAMUSCULAR | Status: AC
  Filled 2022-05-11: qty 2

## 2022-05-11 MED ORDER — SCOPOLAMINE 1 MG/3DAYS TD PT72
1.0000 | MEDICATED_PATCH | TRANSDERMAL | Status: DC
  Administered 2022-05-11: 1.5 mg via TRANSDERMAL
  Filled 2022-05-11: qty 1

## 2022-05-11 MED ORDER — DOXYCYCLINE HYCLATE 100 MG PO CAPS: 100.0000 mg | ORAL_CAPSULE | Freq: Two times a day (BID) | ORAL | 0 refills | Status: AC

## 2022-05-11 MED ORDER — ACETAMINOPHEN 500 MG PO TABS: 1000.0000 mg | ORAL_TABLET | Freq: Three times a day (TID) | ORAL | 0 refills | Status: AC

## 2022-05-11 MED ORDER — FENTANYL CITRATE PF 50 MCG/ML IJ SOSY: 25.0000 ug | PREFILLED_SYRINGE | INTRAMUSCULAR | Status: DC | PRN

## 2022-05-11 MED ORDER — SUGAMMADEX SODIUM 200 MG/2ML IV SOLN
INTRAVENOUS | Status: DC | PRN
  Administered 2022-05-11: 200 mg via INTRAVENOUS

## 2022-05-11 MED ORDER — CEFAZOLIN SODIUM-DEXTROSE 2-4 GM/100ML-% IV SOLN
2.0000 g | INTRAVENOUS | Status: AC
  Administered 2022-05-11: 2 g via INTRAVENOUS
  Filled 2022-05-11: qty 100

## 2022-05-11 MED ORDER — DEXAMETHASONE SODIUM PHOSPHATE 10 MG/ML IJ SOLN
INTRAMUSCULAR | Status: DC | PRN
  Administered 2022-05-11: 8 mg via INTRAVENOUS

## 2022-05-11 MED ORDER — LACTATED RINGERS IV BOLUS
500.0000 mL | Freq: Once | INTRAVENOUS | Status: AC
  Administered 2022-05-11: 500 mL via INTRAVENOUS

## 2022-05-11 MED ORDER — GABAPENTIN 300 MG PO CAPS
300.0000 mg | ORAL_CAPSULE | Freq: Once | ORAL | Status: AC
  Administered 2022-05-11: 300 mg via ORAL
  Filled 2022-05-11: qty 1

## 2022-05-11 MED ORDER — LACTATED RINGERS IV SOLN: INTRAVENOUS | Status: DC

## 2022-05-11 MED ORDER — VANCOMYCIN HCL 1000 MG IV SOLR
INTRAVENOUS | Status: AC
  Filled 2022-05-11: qty 20

## 2022-05-11 MED ORDER — POVIDONE-IODINE 10 % EX SOLN
Freq: Once | CUTANEOUS | Status: DC
  Filled 2022-05-11: qty 118

## 2022-05-11 MED ORDER — EPHEDRINE 5 MG/ML INJ
INTRAVENOUS | Status: AC
  Filled 2022-05-11: qty 5

## 2022-05-11 MED ORDER — CELECOXIB 100 MG PO CAPS: 100.0000 mg | ORAL_CAPSULE | Freq: Two times a day (BID) | ORAL | 0 refills | Status: AC

## 2022-05-11 MED ORDER — MIDAZOLAM HCL 2 MG/2ML IJ SOLN
1.0000 mg | INTRAMUSCULAR | Status: DC
  Administered 2022-05-11: 2 mg via INTRAVENOUS
  Filled 2022-05-11: qty 2

## 2022-05-11 MED ORDER — 0.9 % SODIUM CHLORIDE (POUR BTL) OPTIME
TOPICAL | Status: DC | PRN
  Administered 2022-05-11: 1000 mL

## 2022-05-11 MED ORDER — ASPIRIN 81 MG PO CHEW: 81.0000 mg | CHEWABLE_TABLET | Freq: Two times a day (BID) | ORAL | 0 refills | Status: AC

## 2022-05-11 MED ORDER — EPHEDRINE SULFATE-NACL 50-0.9 MG/10ML-% IV SOSY
PREFILLED_SYRINGE | INTRAVENOUS | Status: DC | PRN
  Administered 2022-05-11 (×3): 5 mg via INTRAVENOUS

## 2022-05-11 MED ORDER — SODIUM CHLORIDE 0.9 % IR SOLN
Status: DC | PRN
  Administered 2022-05-11: 1000 mL

## 2022-05-11 MED ORDER — VANCOMYCIN HCL IN DEXTROSE 1-5 GM/200ML-% IV SOLN
1000.0000 mg | INTRAVENOUS | Status: AC
Start: 2022-05-11 — End: 2022-05-11
  Administered 2022-05-11: 1000 mg via INTRAVENOUS
  Filled 2022-05-11: qty 200

## 2022-05-11 MED ORDER — CHLORHEXIDINE GLUCONATE 0.12 % MT SOLN
15.0000 mL | Freq: Once | OROMUCOSAL | Status: AC
  Administered 2022-05-11: 15 mL via OROMUCOSAL

## 2022-05-11 MED ORDER — ONDANSETRON HCL 4 MG PO TABS: 4.0000 mg | ORAL_TABLET | Freq: Three times a day (TID) | ORAL | 0 refills | Status: AC | PRN

## 2022-05-11 MED ORDER — BUPIVACAINE LIPOSOME 1.3 % IJ SUSP
INTRAMUSCULAR | Status: DC | PRN
  Administered 2022-05-11: 10 mg via PERINEURAL

## 2022-05-11 MED ORDER — PROPOFOL 10 MG/ML IV BOLUS
INTRAVENOUS | Status: AC
  Filled 2022-05-11: qty 20

## 2022-05-11 MED ORDER — TRANEXAMIC ACID-NACL 1000-0.7 MG/100ML-% IV SOLN
1000.0000 mg | INTRAVENOUS | Status: AC
  Administered 2022-05-11: 1000 mg via INTRAVENOUS
  Filled 2022-05-11: qty 100

## 2022-05-11 MED ORDER — VANCOMYCIN HCL 1000 MG IV SOLR
INTRAVENOUS | Status: DC | PRN
  Administered 2022-05-11: 1000 mg via TOPICAL

## 2022-05-11 MED ORDER — ACETAMINOPHEN 500 MG PO TABS: 1000.0000 mg | ORAL_TABLET | Freq: Once | ORAL | Status: DC

## 2022-05-11 MED ORDER — ORAL CARE MOUTH RINSE: 15.0000 mL | Freq: Once | OROMUCOSAL | Status: AC

## 2022-05-11 MED ORDER — ROCURONIUM BROMIDE 10 MG/ML (PF) SYRINGE
PREFILLED_SYRINGE | INTRAVENOUS | Status: DC | PRN
  Administered 2022-05-11: 10 mg via INTRAVENOUS
  Administered 2022-05-11: 40 mg via INTRAVENOUS

## 2022-05-11 MED ORDER — PHENYLEPHRINE 80 MCG/ML (10ML) SYRINGE FOR IV PUSH (FOR BLOOD PRESSURE SUPPORT)
PREFILLED_SYRINGE | INTRAVENOUS | Status: AC
  Filled 2022-05-11: qty 10

## 2022-05-11 MED ORDER — PHENYLEPHRINE 80 MCG/ML (10ML) SYRINGE FOR IV PUSH (FOR BLOOD PRESSURE SUPPORT)
PREFILLED_SYRINGE | INTRAVENOUS | Status: DC | PRN
  Administered 2022-05-11 (×4): 40 ug via INTRAVENOUS

## 2022-05-11 MED ORDER — SUCCINYLCHOLINE CHLORIDE 200 MG/10ML IV SOSY
PREFILLED_SYRINGE | INTRAVENOUS | Status: DC | PRN
  Administered 2022-05-11: 160 mg via INTRAVENOUS

## 2022-05-11 MED ORDER — AMISULPRIDE (ANTIEMETIC) 5 MG/2ML IV SOLN: 10.0000 mg | Freq: Once | INTRAVENOUS | Status: DC | PRN

## 2022-05-11 MED ORDER — PROMETHAZINE HCL 25 MG/ML IJ SOLN: 6.2500 mg | INTRAMUSCULAR | Status: DC | PRN

## 2022-05-11 SURGICAL SUPPLY — 91 items
AID PSTN UNV HD RSTRNT DISP (MISCELLANEOUS) ×1
APL PRP STRL LF DISP 70% ISPRP (MISCELLANEOUS) ×2
BAG COUNTER SPONGE SURGICOUNT (BAG) ×2 IMPLANT
BAG SPNG CNTER NS LX DISP (BAG) ×1
BASEPLATE GLENOSPHERE 25 STD (Miscellaneous) IMPLANT
BIT DRILL 3.2 PERIPHERAL SCREW (BIT) IMPLANT
BLADE SAW SGTL 73X25 THK (BLADE) ×2 IMPLANT
BLADE SURG SZ10 CARB STEEL (BLADE) ×2 IMPLANT
BNDG CMPR 9X4 STRL LF SNTH (GAUZE/BANDAGES/DRESSINGS)
BNDG ELASTIC 4X5.8 VLCR STR LF (GAUZE/BANDAGES/DRESSINGS) ×2 IMPLANT
BNDG ELASTIC 6X5.8 VLCR STR LF (GAUZE/BANDAGES/DRESSINGS) ×2 IMPLANT
BNDG ESMARK 4X9 LF (GAUZE/BANDAGES/DRESSINGS) IMPLANT
BSPLAT GLND STD 25 RVRS SHLDR (Miscellaneous) ×1 IMPLANT
CHLORAPREP W/TINT 26 (MISCELLANEOUS) ×4 IMPLANT
CLSR STERI-STRIP ANTIMIC 1/2X4 (GAUZE/BANDAGES/DRESSINGS) ×2 IMPLANT
COOLER ICEMAN CLASSIC (MISCELLANEOUS) IMPLANT
COVER BACK TABLE 60X90IN (DRAPES) ×2 IMPLANT
COVER SURGICAL LIGHT HANDLE (MISCELLANEOUS) ×2 IMPLANT
CUFF TOURN SGL QUICK 24 (TOURNIQUET CUFF)
CUFF TOURN SGL QUICK 34 (TOURNIQUET CUFF)
CUFF TRNQT CYL 24X4X16.5-23 (TOURNIQUET CUFF) IMPLANT
CUFF TRNQT CYL 34X4.125X (TOURNIQUET CUFF) IMPLANT
DRAPE C-ARM 42X120 X-RAY (DRAPES) ×2 IMPLANT
DRAPE INCISE IOBAN 66X45 STRL (DRAPES) ×2 IMPLANT
DRAPE ORTHO SPLIT 77X108 STRL (DRAPES) ×2
DRAPE POUCH INSTRU U-SHP 10X18 (DRAPES) IMPLANT
DRAPE SHEET LG 3/4 BI-LAMINATE (DRAPES) ×4 IMPLANT
DRAPE SURG ORHT 6 SPLT 77X108 (DRAPES) ×4 IMPLANT
DRAPE TOP 10253 STERILE (DRAPES) ×2 IMPLANT
DRSG AQUACEL AG ADV 3.5X 4 (GAUZE/BANDAGES/DRESSINGS) ×4 IMPLANT
DRSG AQUACEL AG ADV 3.5X 6 (GAUZE/BANDAGES/DRESSINGS) ×2 IMPLANT
DRSG AQUACEL AG ADV 3.5X10 (GAUZE/BANDAGES/DRESSINGS) IMPLANT
ELECT BLADE TIP CTD 4 INCH (ELECTRODE) ×2 IMPLANT
ELECT PENCIL ROCKER SW 15FT (MISCELLANEOUS) IMPLANT
ELECT REM PT RETURN 15FT ADLT (MISCELLANEOUS) ×2 IMPLANT
FACESHIELD WRAPAROUND (MASK) ×2 IMPLANT
FACESHIELD WRAPAROUND OR TEAM (MASK) ×2 IMPLANT
GAUZE XEROFORM 1X8 LF (GAUZE/BANDAGES/DRESSINGS) IMPLANT
GLENOSPHERE REV SHOULDER 36 (Joint) IMPLANT
GLOVE BIO SURGEON STRL SZ 6.5 (GLOVE) ×4 IMPLANT
GLOVE BIOGEL PI IND STRL 6.5 (GLOVE) ×2 IMPLANT
GLOVE BIOGEL PI IND STRL 8 (GLOVE) ×2 IMPLANT
GLOVE ECLIPSE 8.0 STRL XLNG CF (GLOVE) ×4 IMPLANT
GOWN STRL REUS W/ TWL LRG LVL3 (GOWN DISPOSABLE) ×4 IMPLANT
GOWN STRL REUS W/TWL LRG LVL3 (GOWN DISPOSABLE) ×2
GUIDEWIRE GLENOID 2.5X220 (WIRE) IMPLANT
HANDPIECE INTERPULSE COAX TIP (DISPOSABLE) ×1
IMPL REVERSE SHOULDER 0X3.5 (Shoulder) IMPLANT
IMPLANT REVERSE SHOULDER 0X3.5 (Shoulder) ×1 IMPLANT
INSERT HUMERAL 36X6MM 12.5DEG (Insert) IMPLANT
KIT BASIN OR (CUSTOM PROCEDURE TRAY) ×2 IMPLANT
KIT STABILIZATION SHOULDER (MISCELLANEOUS) ×2 IMPLANT
KIT TURNOVER KIT A (KITS) IMPLANT
LOOP VESSEL MAXI BLUE (MISCELLANEOUS) IMPLANT
MANIFOLD NEPTUNE II (INSTRUMENTS) ×2 IMPLANT
NDL MAYO CATGUT SZ4 TPR NDL (NEEDLE) IMPLANT
NEEDLE MAYO CATGUT SZ4 (NEEDLE) IMPLANT
NS IRRIG 1000ML POUR BTL (IV SOLUTION) ×2 IMPLANT
PACK SHOULDER (CUSTOM PROCEDURE TRAY) ×2 IMPLANT
PAD COLD SHLDR WRAP-ON (PAD) IMPLANT
RESTRAINT HEAD UNIVERSAL NS (MISCELLANEOUS) ×2 IMPLANT
SCREW 5.5X14 (Screw) IMPLANT
SCREW BONE THREAD 6.5X35 (Screw) IMPLANT
SCREW PERIPHERAL 30 (Screw) IMPLANT
SET HNDPC FAN SPRY TIP SCT (DISPOSABLE) ×2 IMPLANT
SLEEVE SCD COMPRESS KNEE MED (STOCKING) IMPLANT
SLING ULTRA II L (ORTHOPEDIC SUPPLIES) IMPLANT
SLING ULTRA III MED (ORTHOPEDIC SUPPLIES) ×2 IMPLANT
SPONGE T-LAP 4X18 ~~LOC~~+RFID (SPONGE) ×2 IMPLANT
STEM LONG PTC HUMERALTI SZ 2B (Stem) IMPLANT
STRIP CLOSURE SKIN 1/2X4 (GAUZE/BANDAGES/DRESSINGS) ×2 IMPLANT
SUCTION FRAZIER HANDLE 10FR (MISCELLANEOUS) ×1
SUCTION FRAZIER HANDLE 12FR (TUBING) ×1
SUCTION TUBE FRAZIER 10FR DISP (MISCELLANEOUS) ×2 IMPLANT
SUCTION TUBE FRAZIER 12FR DISP (TUBING) ×2 IMPLANT
SUT ETHIBOND 2 V 37 (SUTURE) ×2 IMPLANT
SUT ETHIBOND NAB CT1 #1 30IN (SUTURE) ×2 IMPLANT
SUT ETHILON 2 0 PS N (SUTURE) IMPLANT
SUT FIBERWIRE #5 38 CONV NDL (SUTURE)
SUT MNCRL AB 4-0 PS2 18 (SUTURE) ×2 IMPLANT
SUT MON AB 3-0 SH 27 (SUTURE) ×1
SUT MON AB 3-0 SH27 (SUTURE) ×2 IMPLANT
SUT VIC AB 0 CT1 27 (SUTURE) ×1
SUT VIC AB 0 CT1 27XBRD ANBCTR (SUTURE) ×2 IMPLANT
SUT VIC AB 0 CT1 36 (SUTURE) IMPLANT
SUT VIC AB 3-0 SH 27 (SUTURE) ×1
SUT VIC AB 3-0 SH 27X BRD (SUTURE) ×2 IMPLANT
SUTURE FIBERWR #5 38 CONV NDL (SUTURE) IMPLANT
TOWEL OR 17X26 10 PK STRL BLUE (TOWEL DISPOSABLE) ×2 IMPLANT
TUBE SUCTION HIGH CAP CLEAR NV (SUCTIONS) ×2 IMPLANT
WATER STERILE IRR 1000ML POUR (IV SOLUTION) ×4 IMPLANT

## 2022-05-11 NOTE — Anesthesia Procedure Notes (Signed)
Anesthesia Regional Block: Interscalene brachial plexus block   Pre-Anesthetic Checklist: , timeout performed,  Correct Patient, Correct Site, Correct Laterality,  Correct Procedure, Correct Position, site marked,  Risks and benefits discussed,  Surgical consent,  Pre-op evaluation,  At surgeon's request and post-op pain management  Laterality: Left  Prep: chloraprep       Needles:  Injection technique: Single-shot  Needle Type: Echogenic Stimulator Needle     Needle Length: 5cm  Needle Gauge: 22     Additional Needles:   Narrative:  Start time: 05/11/2022 9:49 AM End time: 05/11/2022 9:51 AM Injection made incrementally with aspirations every 5 mL.  Performed by: Personally  Anesthesiologist: Duane Boston, MD  Additional Notes: Functioning IV was confirmed and monitors applied.  A 48m 22ga echogenic arrow stimulator was used. Sterile prep and drape,hand hygiene and sterile gloves were used.Ultrasound guidance: relevant anatomy identified, needle position confirmed, local anesthetic spread visualized around nerve(s)., vascular puncture avoided.  Image printed for medical record.  Negative aspiration and negative test dose prior to incremental administration of local anesthetic. The patient tolerated the procedure well.

## 2022-05-11 NOTE — Interval H&P Note (Signed)
All questions answered. Patient would like to proceed.

## 2022-05-11 NOTE — Anesthesia Postprocedure Evaluation (Signed)
Anesthesia Post Note  Patient: Emily Braun  Procedure(s) Performed: REVERSE SHOULDER ARTHROPLASTY (Left: Shoulder) HARDWARE REMOVAL (Left: Shoulder)     Patient location during evaluation: PACU Anesthesia Type: Regional and General Level of consciousness: sedated Pain management: pain level controlled Vital Signs Assessment: post-procedure vital signs reviewed and stable Respiratory status: spontaneous breathing and respiratory function stable Cardiovascular status: stable Postop Assessment: no apparent nausea or vomiting Anesthetic complications: no   No notable events documented.  Last Vitals:  Vitals:   05/11/22 1330 05/11/22 1345  BP: 125/65 124/77  Pulse: 76 73  Resp: 10 15  Temp: 37 C   SpO2: 97% 97%    Last Pain:  Vitals:   05/11/22 1300  TempSrc:   PainSc: 0-No pain                 [name] DANIEL

## 2022-05-11 NOTE — Op Note (Signed)
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  Patient had a clear malunion and gone on to avascular necrosis.  Hardware was removed without issue.  It was difficult to get access to the shoulder as the malunion of the lesser tuberosity was quite significant and had essentially frozen the patient's internal/external rotation.  Once were able to take down the lesser and treat the malunion we were able to dislocate the joint and the humeral head had clearly been split and essentially acted as though it was avascular necrosis tight bone very dense in nature.  Patient's posterior capsule was quite contracted and we had to release a large amount of the patient's cuff in order to get the shoulder reduced.  Tension was relatively high initially however it stretched quickly after we had reduced it for a few moments.  Axillary nerve tug test was unable to be completed secondary to patient's significant scarring.  Post-operative plan: The patient will be NWB in sling.  She will be placed on doxycycline twice a day until her cultures result.  The patient will be will be discharged from PACU if continues to be stable as was plan prior to surgery.  DVT prophylaxis Aspirin 81 mg twice daily for 6 weeks.  Pain control with PRN pain medication preferring oral medicines.  Follow up plan will be scheduled in approximately 7 days for incision check and XR.  Physical therapy to start motion after 4 weeks however they can start early sling training and range of motion of the elbow and wrist immediately.  Implants: Tornier flex to be longstem, 0 high offset tray with  a +6 polyethylene, 25 standard baseplate, 35 center screw with 2 peripheral screws and a 36 standard glenosphere  Post-Op Diagnosis: Same Surgeons:Primary: Hiram Gash, MD Assistants:Caroline McBane PA-C Location: [facility] ROOM 06 Anesthesia: General with Exparel Interscalene Antibiotics: Ancef 2g preop, Vancomycin 1043m locally Tourniquet time: None Estimated Blood Loss: 1239Complications: None Specimens: 2 for culture, left shoulder 1 and 2 Implants: Implant Name Type Inv. Item Serial No. Manufacturer Lot No. LRB No. Used Action  GLENOSPHERE REV SHOULDER 36 - [id number] GLENOSPHERE REV SHOULDER 36 [id number]  Left 1 Implanted  BASEPLATE GLENOSPHERE 25 STD - [id number] BASEPLATE GLENOSPHERE 25 STD [id number]  Left 1 Implanted  SCREW BONE THREAD 6.5X35 - [id number] SCREW BONE THREAD 6.5X35  [employer]  Left 1 Implanted  SCREW PERIPHERAL 30 - [id number] SCREW PERIPHERAL 30  [employer]  Left 1 Implanted  SCREW 5.5X14 - [id number] SCREW 5.5X14  [employer]  Left 1 Implanted  STEM LONG PTC HUMERALTI SZ 2B - [id number] STEM LONG PTC HUMERALTI SZ 2B [id number]  Left 1 Implanted  IMPLANT REVERSE SHOULDER 0X3.5 - [id number] IMPLANT REVERSE SHOULDER 0X3.5 [id number]  Left 1 Implanted  INSERT HUMERAL 36X6MM 12.5DEG - [id number] INSERT HUMERAL 36X6MM 12.5DEG [id number]  Left 1 Implanted    Indications for Surgery:   CJAMIE-LEE GALDAMEZis a 46y.o. female with left shoulder malunion and AVN.  Benefits and risks of operative and nonoperative management were discussed prior to surgery with patient/guardian(s) and informed consent form  was completed.  Infection and need for further surgery were discussed as was prosthetic stability and cuff issues.  We additionally specifically discussed risks of axillary nerve injury, infection, periprosthetic fracture, continued pain and longevity  of implants prior to beginning procedure.      Procedure:   The patient was identified in the preoperative holding area where the surgical site was marked. Block placed by anesthesia with exparel.  The patient was taken to the OR where a procedural timeout was called and the above noted anesthesia was induced.  The patient was positioned beachchair on allen table with spider arm positioner.  Preoperative antibiotics were dosed.  The patient's left shoulder was prepped and draped in the usual sterile fashion.  A second preoperative timeout was called.       We used the patient's previous deltopectoral type incision and extended 3 cm proximally and distally.  With the skin sharply achieving hemostasis we progressed.  We identified the tissue plane that was previously used and went through this carefully dissecting.  There was significant tension on the tissues and we were able to identify the deltopectoral interval.  Due to significant subcoracoid scarring and the malunion blocking our approach were unable to place a Covell retractor and instead used blunt retractors.  We identified the plate and stayed hard on bone and subperiosteally elevated off of the plate as well as the bone taking a full flap.  At least half of the pectoralis major was released to allow for mobilization of the tissues.  We then were able to remove the hardware without issue.  Excisional debridement was performed with multiple specimens taken in full synovectomy completed of the joint throughout the case.  We sent her specimens for culture as above with a 31-week-old for C acnes.  At this point we were able to carefully start releasing the subscapularis.  The subscapularis was quite contracted and the lesser tuberosity malunion made it very difficult to gain access to the joint.  We were eventually able to take down the malunion and treat this while resecting and releasing the scarred subscapularis.  The lack of efficiently mobile  cuff made anatomic or hemiarthroplasty likely unusable for the patient we felt that reverse was indicated.  The head was split and malunited and had softened superiorly.  We performed a resection and started broaching in typical fashion with the flex broaches stopping and a 2 long stem.  We recut off of the stem to ensure that we had an appropriate osteotomy.  We then performed releases of the superior cuff and posterior capsule from the humeral side as there is significant scarring in the subdeltoid space.  Were careful to stay away from the axillary nerve.  Actually nerve tug test was not able to completed secondary to scarring.  We placed blunt retractors and were able to visualize the glenoid.  The glenoid is quite small however we had templated with blueprint a standard 25 baseplate.  We used this reamed concentrically over a wire taking a small amount of bone inferiorly and obtaining about 70% seating of our baseplate.  We drilled over a wire for the central boss and then drilled for our screw.  A 35 mm x 6.5 mm screw was used to insert the baseplate.  We had good bony apposition of the baseplate.  We cleared peripheral soft tissue and placed 2 locking screws superior and inferior.  Once this was complete we placed a 36 standard glenosphere in standard fashion and tighten the  center screw.  We turned attention back to the humeral side.  At this point we had released enough contracted tissue that were able to expose the humerus without issue.  We placed a trial set of implants and selected a 0 high offset baseplate, 6 mm polyethylene.  Our final implants were obtained and we irrigated copiously performing again a residual synovectomy and then placed our final implants.  We reduced the joint and had good stability and no impingement.  We irrigated copiously placed local vancomycin powder.  We closed incision in multilayer fashion finishing with nylon suture in a running fashion.  Sterile dressing and sling  were placed.  Patient awoken taken to PACU in stable condition.   Noemi Chapel, PA-C, present and scrubbed throughout the case, critical for completion in a timely fashion, and for retraction, instrumentation, closure.

## 2022-05-11 NOTE — Discharge Instructions (Signed)
Ophelia Charter MD, MPH Noemi Chapel, PA-C [place] [address] [phone] (tel)   [phone] (fax)   [place] may leave the operative dressing in place until your follow-up appointment. KEEP THE INCISIONS CLEAN AND DRY. There may be a small amount of fluid/bleeding leaking at the surgical site. This is normal after surgery.  If it fills with liquid or blood please call us immediately to change it for you. Use the provided ice machine or Ice packs as often as possible for the first 3-4 days, then as needed for pain relief.   Keep a layer of cloth or a shirt between your skin and the cooling unit to prevent frost bite as it can get very cold.  SHOWERING: - You may shower on Post-Op Day #2.  - The dressing is water resistant but do not scrub it as it may start to peel up.   - You may remove the sling for showering - Gently pat the area dry.  - Do not soak the shoulder in water.  - Do not go swimming in the pool or ocean until your incision has completely healed (about 4-6 weeks after surgery) - KEEP THE INCISIONS CLEAN AND DRY.  EXERCISES Wear the sling at all times  You may remove the sling for showering, but keep the arm across the chest or in a secondary sling.    Accidental/Purposeful External Rotation and shoulder flexion (reaching behind you) is to be avoided at all costs for the first month. It is ok to come out of your sling if your are sitting and have assistance for eating.   Do not lift anything heavier than 1 pound until we discuss it further in clinic.  It is normal for your fingers/hand to become more swollen after surgery and discolored from bruising.   This will resolve over the first few weeks usually after surgery. Please continue to ambulate and do not stay sitting or lying for too long.  Perform foot and wrist pumps to assist in circulation.   REGIONAL  ANESTHESIA (NERVE BLOCKS) The anesthesia team may have performed a nerve block for you this is a great tool used to minimize pain.   The block may start wearing off overnight (between 8-24 hours postop) When the block wears off, your pain may go from nearly zero to the pain you would have had postop without the block. This is an abrupt transition but nothing dangerous is happening.   This can be a challenging period but utilize your as needed pain medications to try and manage this period. We suggest you use the pain medication the first night prior to going to bed, to ease this transition.  You may take an extra dose of narcotic when this happens if needed   POST-OP MEDICATIONS- Multimodal approach to pain control In general your pain will be controlled with a combination of substances.  Prescriptions unless otherwise discussed are electronically sent to your pharmacy.  This is a carefully made plan we use to minimize narcotic use.     Celebrex - Anti-inflammatory medication taken on a scheduled basis Acetaminophen - Non-narcotic pain medicine taken on a scheduled basis  Oxycodone - This is a strong narcotic, to be used only on an "as needed" basis for SEVERE pain. Aspirin '81mg'$  - This medicine is used to minimize the risk of blood clots after surgery. Zofran -  take as needed for nausea  Doxycycline - this is an antibiotic, take as instructed  FOLLOW-UP If you develop a Fever (>101.5), Redness or Drainage from the surgical incision site, please call our office to arrange for an evaluation. Please call the office to schedule a follow-up appointment for a wound check, 7-10 days post-operatively.  IF YOU HAVE ANY QUESTIONS, PLEASE FEEL FREE TO CALL OUR OFFICE.  HELPFUL INFORMATION  Your arm will be in a sling following surgery. You will be in this sling for the next 4 weeks.   You may be more comfortable sleeping in a semi-seated position the first few nights following surgery.  Keep a  pillow propped under the elbow and forearm for comfort.  If you have a recliner type of chair it might be beneficial.  If not that is fine too, but it would be helpful to sleep propped up with pillows behind your operated shoulder as well under your elbow and forearm.  This will reduce pulling on the suture lines.  When dressing, put your operative arm in the sleeve first.  When getting undressed, take your operative arm out last.  Loose fitting, button-down shirts are recommended.  In most states it is against the law to drive while your arm is in a sling. And certainly against the law to drive while taking narcotics.  You may return to work/school in the next couple of days when you feel up to it. Desk work and typing in the sling is fine.  We suggest you use the pain medication the first night prior to going to bed, in order to ease any pain when the anesthesia wears off. You should avoid taking pain medications on an empty stomach as it will make you nauseous.  You should wean off your narcotic medicines as soon as you are able.     Most patients will be off or using minimal narcotics before their first postop appointment.   Do not drink alcoholic beverages or take illicit drugs when taking pain medications.  Pain medication may make you constipated.  Below are a few solutions to try in this order: Decrease the amount of pain medication if you aren't having pain. Drink lots of decaffeinated fluids. Drink prune juice and/or each dried prunes  If the first 3 don't work start with additional solutions Take Colace - an over-the-counter stool softener Take Senokot - an over-the-counter laxative Take Miralax - a stronger over-the-counter laxative   Dental Antibiotics:  In most cases prophylactic antibiotics for Dental procdeures after total joint surgery are not necessary.  Exceptions are as follows:  1. History of prior total joint infection  2. Severely immunocompromised (Organ  Transplant, cancer chemotherapy, Rheumatoid biologic meds such as [place])  3. Poorly controlled diabetes (A1C &gt; 8.0, blood glucose over 200)  If you have one of these conditions, contact your surgeon for an antibiotic prescription, prior to your dental procedure.   For more information including helpful videos and documents visit our website:   [email or web address]

## 2022-05-11 NOTE — Anesthesia Procedure Notes (Signed)
Procedure Name: Intubation Date/Time: 05/11/2022 11:07 AM  Performed by: Eben Burow, CRNAPre-anesthesia Checklist: Patient identified, Emergency Drugs available, Suction available, Patient being monitored and Timeout performed Patient Re-evaluated:Patient Re-evaluated prior to induction Oxygen Delivery Method: Circle system utilized Preoxygenation: Pre-oxygenation with 100% oxygen Induction Type: IV induction Ventilation: Mask ventilation without difficulty Laryngoscope Size: Mac and 4 Grade View: Grade II Tube type: Oral Number of attempts: 1 Airway Equipment and Method: Stylet Placement Confirmation: ETT inserted through vocal cords under direct vision, positive ETCO2 and breath sounds checked- equal and bilateral Secured at: 21 cm Tube secured with: Tape Dental Injury: Teeth and Oropharynx as per pre-operative assessment  Comments: Small mouth opening, easy mask, DVL x 1 with Mac 4 grade 2 view

## 2022-05-11 NOTE — Transfer of Care (Signed)
Immediate Anesthesia Transfer of Care Note  Patient: Emily Braun  Procedure(s) Performed: REVERSE SHOULDER ARTHROPLASTY (Left: Shoulder) HARDWARE REMOVAL (Left: Shoulder)  Patient Location: PACU  Anesthesia Type:General  Level of Consciousness: drowsy and patient cooperative  Airway & Oxygen Therapy: Patient Spontanous Breathing and Patient connected to face mask oxygen  Post-op Assessment: Report given to RN  Post vital signs: Reviewed and stable  Last Vitals:  Vitals Value Taken Time  BP    Temp 37 C 05/11/22 1300  Pulse    Resp    SpO2      Last Pain:  Vitals:   05/11/22 0858  TempSrc: Oral  PainSc: 0-No pain      Patients Stated Pain Goal: 3 (16/10/96 0454)  Complications: No notable events documented.

## 2022-05-15 ENCOUNTER — Encounter (HOSPITAL_COMMUNITY): Payer: Self-pay | Admitting: Orthopaedic Surgery

## 2022-05-16 LAB — AEROBIC/ANAEROBIC CULTURE W GRAM STAIN (SURGICAL/DEEP WOUND)
Culture: NO GROWTH
Culture: NO GROWTH
Gram Stain: NONE SEEN
Gram Stain: NONE SEEN

## 2022-05-20 ENCOUNTER — Ambulatory Visit: Payer: 59 | Admitting: Family Medicine

## 2022-05-23 ENCOUNTER — Other Ambulatory Visit: Payer: Self-pay | Admitting: Nurse Practitioner

## 2022-06-01 ENCOUNTER — Encounter: Payer: Self-pay | Admitting: Family Medicine

## 2022-06-02 ENCOUNTER — Other Ambulatory Visit: Payer: Self-pay | Admitting: Family Medicine

## 2022-06-02 NOTE — Telephone Encounter (Signed)
Nurses Since she has not been on the medicine for approximately 1 month it might be difficult for her to tolerate 10 mg Mounjaro once a week  It would be reasonable to restart but I do not think she necessarily has to restart at the bottom dose of 2.5 mg 5 mg Mounjaro once per week 1 month supply may be sent in After she does the 5 mg for 4 weeks then she can go to the 10 mg and then after doing that for a month she may be able to go up to 12.5 but we can discuss this at her next follow-up visit early November  Please communicate with patient if she is on board go ahead with the 5 mg for 1 month thank you

## 2022-06-02 NOTE — Telephone Encounter (Signed)
Spoke with patient regarding Emily Braun, she states she still has Mounjaro 5 mg at home and will do 4 weeks worth and if she needs any more she will have pharmacy reach out, afterwards 10 mg . Will follow up w/ appt

## 2022-06-03 ENCOUNTER — Ambulatory Visit: Payer: 59 | Admitting: Family Medicine

## 2022-06-07 ENCOUNTER — Ambulatory Visit: Payer: 59 | Admitting: Family Medicine

## 2022-06-07 ENCOUNTER — Ambulatory Visit (HOSPITAL_COMMUNITY)
Admission: RE | Admit: 2022-06-07 | Discharge: 2022-06-07 | Disposition: A | Discharge status: Home / Self Care | Payer: 59 | Source: Ambulatory Visit | Attending: Family Medicine | Admitting: Family Medicine

## 2022-06-07 VITALS — BP 138/84 | HR 70 | Temp 98.6°F | Ht 63.0 in | Wt 192.0 lb

## 2022-06-07 DIAGNOSIS — R053 Chronic cough: Secondary | ICD-10-CM | POA: Diagnosis present

## 2022-06-07 MED ORDER — PREDNISONE 50 MG PO TABS: ORAL_TABLET | ORAL | 0 refills | Status: DC

## 2022-06-07 MED ORDER — BENZONATATE 200 MG PO CAPS: 200.0000 mg | ORAL_CAPSULE | Freq: Three times a day (TID) | ORAL | 0 refills | Status: DC | PRN

## 2022-06-07 NOTE — Patient Instructions (Signed)
Chest x-ray at the hospital.  Medication as prescribed.  If cough continues to persist, please let us know.  Take care  Dr. Lacinda Axon

## 2022-06-07 NOTE — Progress Notes (Signed)
Subjective:  Patient ID: Emily Braun, female    DOB: 12-17-1975  Age: 46 y.o. MRN: [id number]  CC: Chief Complaint  Patient presents with   Cough    Dry cough x 1  month since 1 day after shoulder replacement surgery - was intubated at that time    HPI:  46 year old female presents for evaluation of the above.  Patient reports a 1 month history of dry cough.  Occurred after she was intubated for surgery.  Worse during the day.  No fever.  No shortness of breath.  No relieving factors.  No other complaints or concerns at this time.  Patient Active Problem List   Diagnosis Date Noted   Chronic cough 06/07/2022   OSA (obstructive sleep apnea) 04/05/2022   Migraine without aura and without status migrainosus, not intractable 04/05/2022   TMJ (temporomandibular joint syndrome) 01/07/2022   Acute nonintractable headache 01/04/2022   Bilateral carpal tunnel syndrome 03/22/2019   Hyperlipidemia associated with type 2 diabetes mellitus ([place]) 03/16/2018   DM (diabetes mellitus), type 2 ([place]) 08/28/2015   GERD (gastroesophageal reflux disease) 08/25/2014   Morbid obesity ([place]) 08/14/2013   Essential hypertension, benign 07/03/2013    Social Hx   Social History   Socioeconomic History   Marital status: Single    Spouse name: Not on file   Number of children: 1   Years of education: Not on file   Highest education level: Not on file  Occupational History   Occupation: patient Warehouse manager    Comment: [employer]  Tobacco Use   Smoking status: Never   Smokeless tobacco: Never  Vaping Use   Vaping Use: Never used  Substance and Sexual Activity   Alcohol use: Not Currently    Comment: socially   Drug use: Never   Sexual activity: Yes    Birth control/protection: None  Other Topics Concern   Not on file  Social History Narrative   Lives with foster-son   Caffeine use: sometimes   Right handed    Social Determinants of Health   Financial Resource  Strain: Not on file  Food Insecurity: Not on file  Transportation Needs: Not on file  Physical Activity: Not on file  Stress: Not on file  Social Connections: Not on file    Review of Systems Per HPI  Objective:  BP 138/84   Pulse 70   Temp 98.6 F (37 C)   Ht '5\' 3"'$  (1.6 m)   Wt 192 lb (87.1 kg)   LMP 05/12/2022 (Approximate)   SpO2 98%   BMI 34.01 kg/m      06/07/2022   11:23 AM 05/11/2022    2:45 PM 05/11/2022    2:30 PM  BP/Weight  Systolic BP 470 962 836  Diastolic BP 84 73 67  Wt. (Lbs) 192    BMI 34.01 kg/m2      Physical Exam Constitutional:      General: She is not in acute distress.    Appearance: Normal appearance. She is obese.  HENT:     Head: Normocephalic and atraumatic.     Mouth/Throat:     Pharynx: Oropharynx is clear.  Eyes:     General:        Right eye: No discharge.        Left eye: No discharge.     Conjunctiva/sclera: Conjunctivae normal.  Cardiovascular:     Rate and Rhythm: Normal rate and regular rhythm.  Pulmonary:     Effort:  Pulmonary effort is normal.     Breath sounds: Normal breath sounds. No wheezing or rales.  Neurological:     Mental Status: She is alert.  Psychiatric:        Mood and Affect: Mood normal.        Behavior: Behavior normal.     Lab Results  Component Value Date   WBC 6.2 05/06/2022   HGB 15.1 (H) 05/06/2022   HCT 46.0 05/06/2022   PLT 287 05/06/2022   GLUCOSE 96 05/06/2022   CHOL 102 10/22/2021   TRIG 34 10/22/2021   HDL 58 10/22/2021   LDLDIRECT 28 09/20/2019   LDLCALC 34 10/22/2021   ALT 22 10/22/2021   AST 23 10/22/2021   NA 136 05/06/2022   K 3.9 05/06/2022   CL 105 05/06/2022   CREATININE 0.62 05/06/2022   BUN 13 05/06/2022   CO2 23 05/06/2022   TSH 1.162 08/25/2014   HGBA1C 5.5 05/06/2022     Assessment & Plan:   Problem List Items Addressed This Visit       Other   Chronic cough - Primary    Chest x-ray was obtained today and independently reviewed by me.   Interpretation: Small airways disease.  No pneumonia. Treating with prednisone and Tessalon Perles.  If continues to persist will refer to pulmonology for evaluation including PFTs.      Relevant Medications   benzonatate (TESSALON) 200 MG capsule   Other Relevant Orders   DG Chest 2 View (Completed)    Meds ordered this encounter  Medications   benzonatate (TESSALON) 200 MG capsule    Sig: Take 1 capsule (200 mg total) by mouth 3 (three) times daily as needed for cough.    Dispense:  30 capsule    Refill:  0   predniSONE (DELTASONE) 50 MG tablet    Sig: 1 tablet daily x 5 days    Dispense:  5 tablet    Refill:  0    Follow-up:  PRN  [place]

## 2022-06-07 NOTE — Assessment & Plan Note (Signed)
Chest x-ray was obtained today and independently reviewed by me.  Interpretation: Small airways disease.  No pneumonia. Treating with prednisone and Tessalon Perles.  If continues to persist will refer to pulmonology for evaluation including PFTs.

## 2022-06-20 ENCOUNTER — Other Ambulatory Visit: Payer: Self-pay | Admitting: Family Medicine

## 2022-06-29 ENCOUNTER — Encounter: Payer: Self-pay | Admitting: Family Medicine

## 2022-06-29 ENCOUNTER — Ambulatory Visit: Payer: 59 | Admitting: Family Medicine

## 2022-06-29 VITALS — BP 138/82 | HR 75 | Temp 98.2°F | Ht 63.0 in | Wt 185.0 lb

## 2022-06-29 DIAGNOSIS — E119 Type 2 diabetes mellitus without complications: Secondary | ICD-10-CM | POA: Diagnosis not present

## 2022-06-29 DIAGNOSIS — G4733 Obstructive sleep apnea (adult) (pediatric): Secondary | ICD-10-CM | POA: Diagnosis not present

## 2022-06-29 DIAGNOSIS — Z794 Long term (current) use of insulin: Secondary | ICD-10-CM

## 2022-06-29 DIAGNOSIS — I10 Essential (primary) hypertension: Secondary | ICD-10-CM | POA: Diagnosis not present

## 2022-06-29 DIAGNOSIS — E785 Hyperlipidemia, unspecified: Secondary | ICD-10-CM

## 2022-06-29 DIAGNOSIS — E1169 Type 2 diabetes mellitus with other specified complication: Secondary | ICD-10-CM | POA: Diagnosis not present

## 2022-06-29 MED ORDER — TIRZEPATIDE 7.5 MG/0.5ML ~~LOC~~ SOAJ: 7.5000 mg | SUBCUTANEOUS | 5 refills | Status: DC

## 2022-06-29 NOTE — Progress Notes (Signed)
   Subjective:    Patient ID: Emily Braun, female    DOB: [birth year], 46 y.o.   MRN: [id number]  Diabetes She presents for her follow-up diabetic visit. She has type 2 diabetes mellitus. Current diabetic treatments: terzepatide, lantus.   Hypertension taking lisinopril  Recent l shoulder surgery left shoulder  Type 2 diabetes mellitus without complication, with long-term current use of insulin (HCC) - Plan: Hemoglobin A1c, Lipid Panel, Hepatic Function Panel, Basic Metabolic Panel, Microalbumin/Creatinine Ratio, Urine, tirzepatide (MOUNJARO) 7.5 MG/0.5ML Pen  Hyperlipidemia associated with type 2 diabetes mellitus ([place]) - Plan: Hemoglobin A1c, Lipid Panel, Hepatic Function Panel, Basic Metabolic Panel, Microalbumin/Creatinine Ratio, Urine  Essential hypertension, benign - Plan: Hemoglobin A1c, Lipid Panel, Hepatic Function Panel, Basic Metabolic Panel, Microalbumin/Creatinine Ratio, Urine  OSA (obstructive sleep apnea) - Plan: Hemoglobin A1c, Lipid Panel, Hepatic Function Panel, Basic Metabolic Panel, Microalbumin/Creatinine Ratio, Urine, tirzepatide (MOUNJARO) 7.5 MG/0.5ML Pen  Patient is trying to do the best she can at losing weight trying to watch how she eats and stay physically active.  She is taking her diabetes medicine up until her shoulder surgery for which she stopped taking it under the instruction of the surgeon now she is starting back on it We reviewed over previous lab work  Review of Systems     Objective:   Physical Exam General-in no acute distress Eyes-no discharge Lungs-respiratory rate normal, CTA CV-no murmurs,RRR Extremities skin warm dry no edema Neuro grossly normal Behavior normal, alert        Assessment & Plan:  1. Type 2 diabetes mellitus without complication, with long-term current use of insulin (HCC) Diabetes is actually under good control but recently started back on the Mounjaro at 5 mg she will go up to 7.5 to see if she will tolerate it  if she does not tolerate go back to the 5 if she does tolerate she will stick with that dose for the next several months and follow-up in the spring with lab work - Hemoglobin A1c - Lipid Panel - Hepatic Function Panel - Basic Metabolic Panel - Microalbumin/Creatinine Ratio, Urine - tirzepatide (MOUNJARO) 7.5 MG/0.5ML Pen; Inject 7.5 mg into the skin once a week.  Dispense: 2 mL; Refill: 5  2. Hyperlipidemia associated with type 2 diabetes mellitus ([place]) Continue statin continue healthy diet check lab work before next visit - Hemoglobin A1c - Lipid Panel - Hepatic Function Panel - Basic Metabolic Panel - Microalbumin/Creatinine Ratio, Urine  3. Essential hypertension, benign Blood pressure good control continue current measures more than likely will come under better control when her weight comes down she is trying hard - Hemoglobin A1c - Lipid Panel - Hepatic Function Panel - Basic Metabolic Panel - Microalbumin/Creatinine Ratio, Urine  4. OSA (obstructive sleep apnea) She is trying to lose weight Mounjaro should help With her sleep apnea if its not dramatically better by springtime recommend CPAP - Hemoglobin A1c - Lipid Panel - Hepatic Function Panel - Basic Metabolic Panel - Microalbumin/Creatinine Ratio, Urine - tirzepatide (MOUNJARO) 7.5 MG/0.5ML Pen; Inject 7.5 mg into the skin once a week.  Dispense: 2 mL; Refill: 5  Ideally would like to see blood pressure better more activity and weight loss will help this if not doing better by the next visit adjust medications

## 2022-07-02 ENCOUNTER — Other Ambulatory Visit: Payer: Self-pay | Admitting: Family Medicine

## 2022-07-11 IMAGING — MR MR CERVICAL SPINE W/O CM
5 series · 39 of 48 positions shown · non-contrast
Comparison: Cervical spine CT 06/20/2020

CLINICAL DATA: Neck pain radiating to the upper back intermittently
for 3 years.

EXAM:
MRI CERVICAL SPINE WITHOUT CONTRAST
TECHNIQUE: Multiplanar, multisequence MR imaging of the cervical spine was
performed. No intravenous contrast was administered.

[Series 2: T2 · sagittal · 3.0mm · 0.41mm/px · 8 of 21 slices shown (1 of 2)]
[im 1/21]
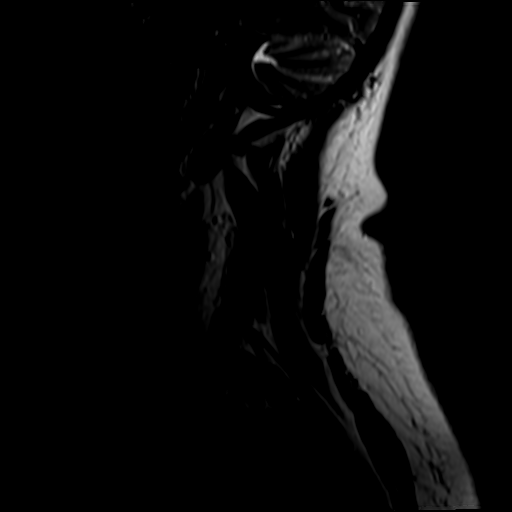
[im 3/21]
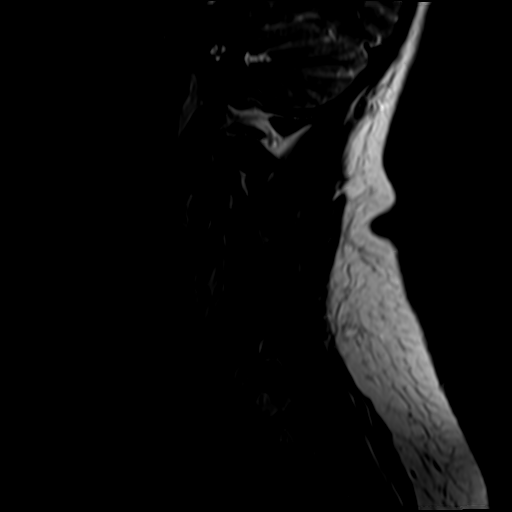
[im 6/21]
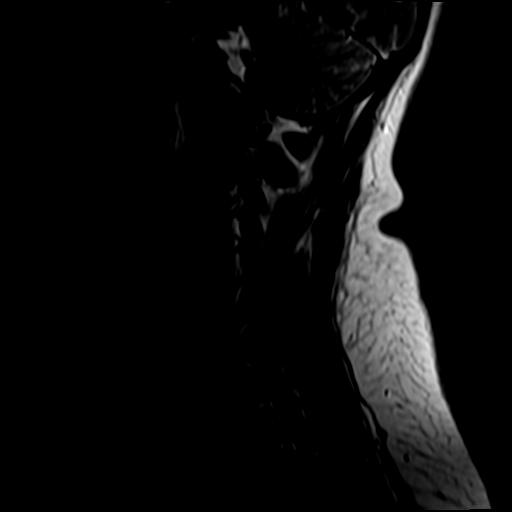
[im 9/21]
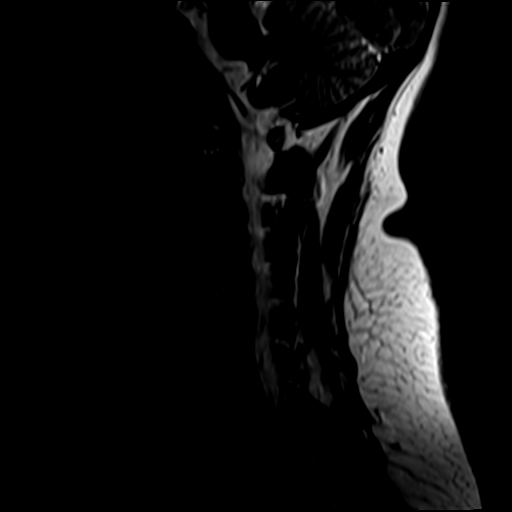
[im 12/21]
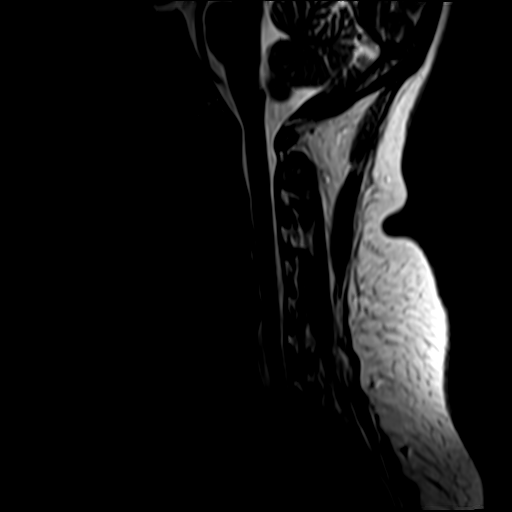
[im 15/21]
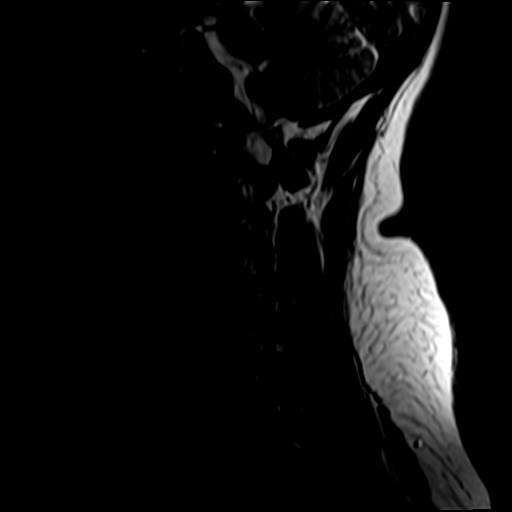
[im 18/21]
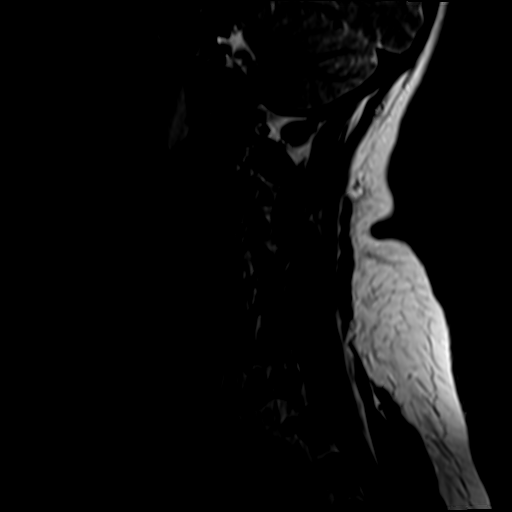
[im 21/21]
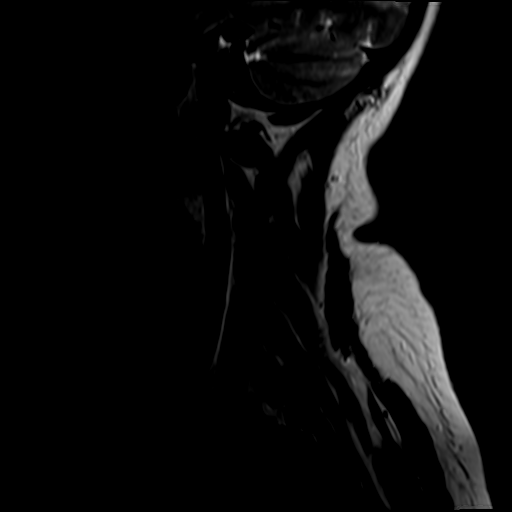

[Series 3: STIR · sagittal · 3.0mm · 0.86mm/px · 9 of 21 slices shown]
[im 1/21]
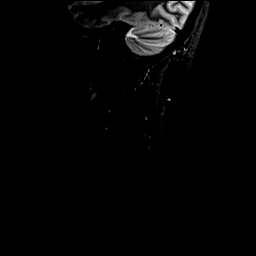
[im 3/21]
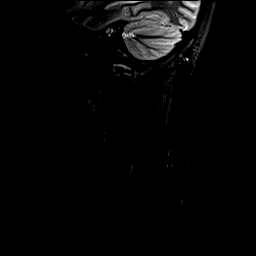
[im 6/21]
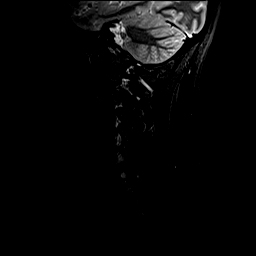
[im 8/21]
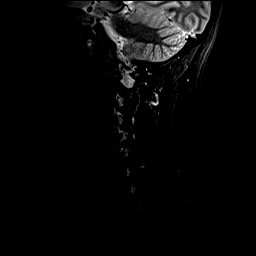
[im 11/21]
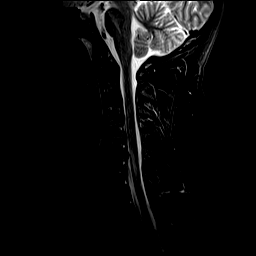
[im 13/21]
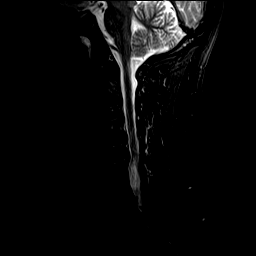
[im 16/21]
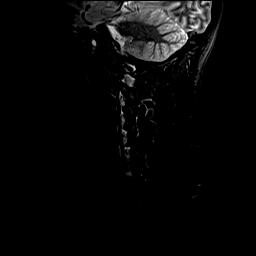
[im 18/21]
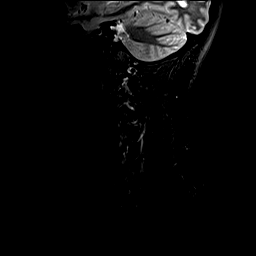
[im 21/21]
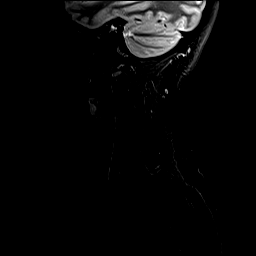

[Series 4: T1 · sagittal · 3.0mm · 0.82mm/px · 9 of 21 slices shown]
[im 1/21]
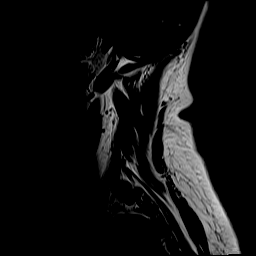
[im 3/21]
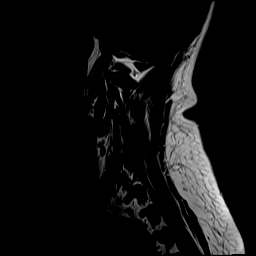
[im 6/21]
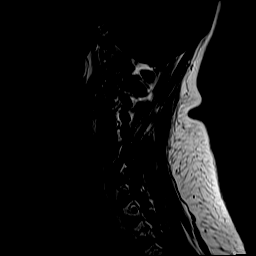
[im 8/21]
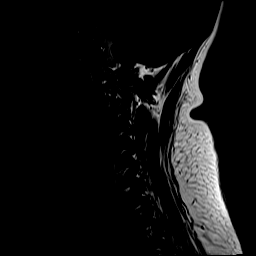
[im 11/21]
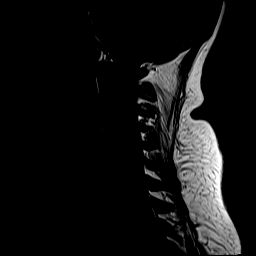
[im 13/21]
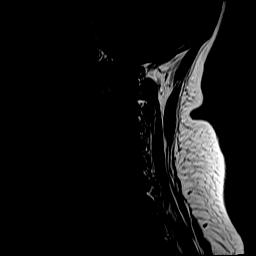
[im 16/21]
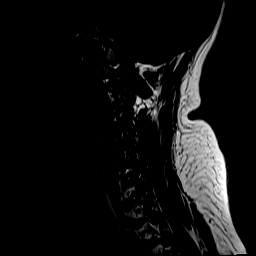
[im 18/21]
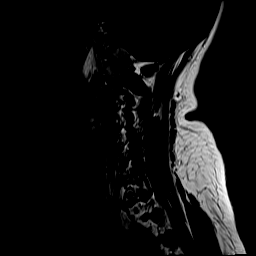
[im 21/21]
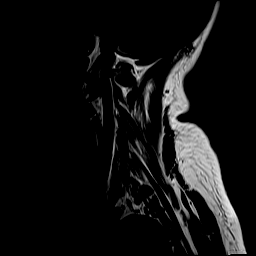

[Series 5: T2 · axial · 3.0mm · 0.70mm/px · z∈[-78,+21]mm · 11 of 27 slices shown (2 of 2)]
[im 1/27]
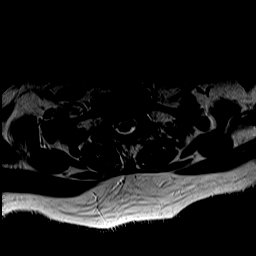
[im 3/27]
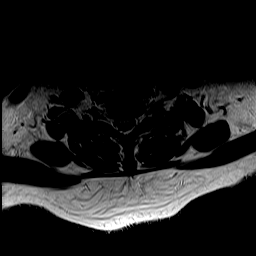
[im 6/27]
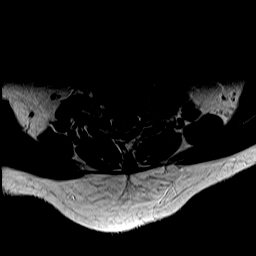
[im 8/27]
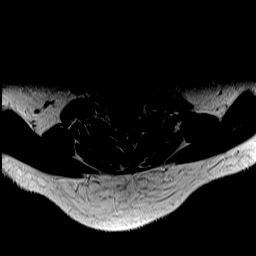
[im 11/27]
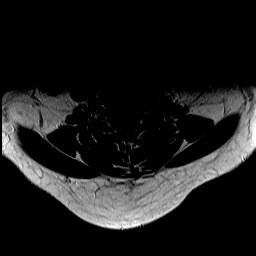
[im 14/27]
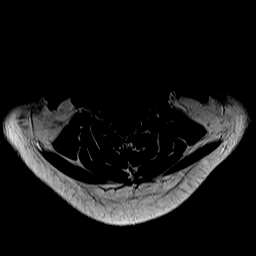
[im 16/27]
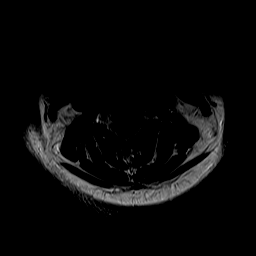
[im 19/27]
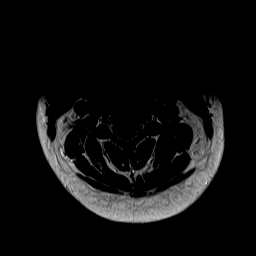
[im 21/27]
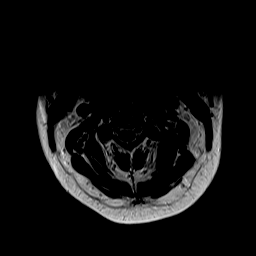
[im 24/27]
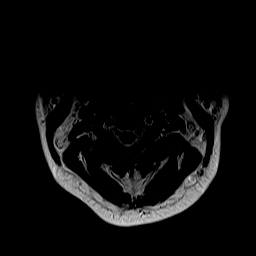
[im 27/27]
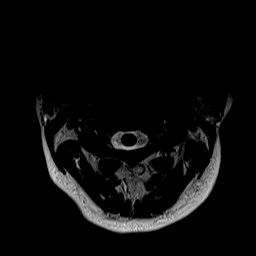

[Series 6: GRE · axial · 3.0mm · 0.35mm/px · z∈[-78,-59]mm · 2 of 27 slices shown]
[im 1/27]
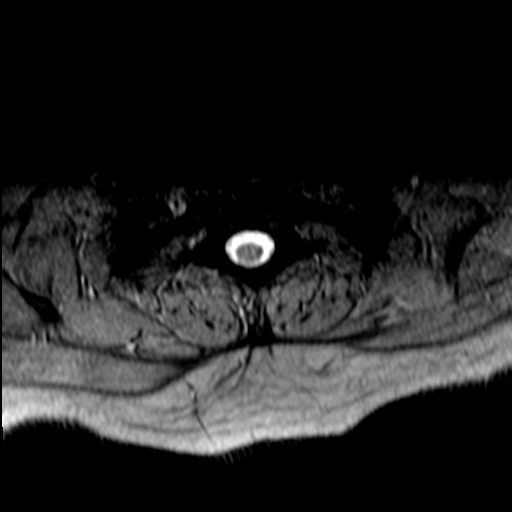
[im 6/27]
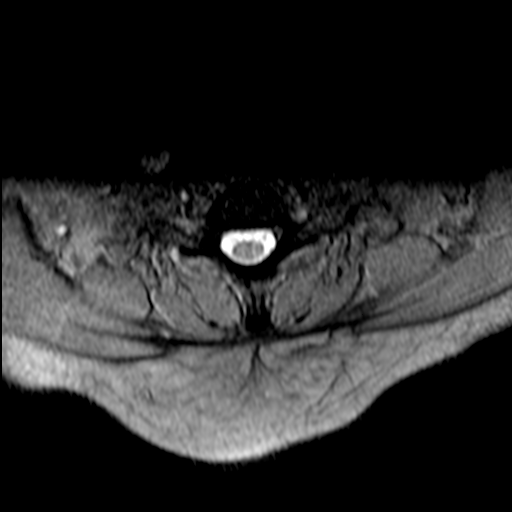

[39 of 48 positions shown; findings below may reference images not displayed]

FINDINGS: The study is mildly motion degraded.

Alignment: Straightening of the normal cervical lordosis. No
listhesis.

Vertebrae: No fracture, suspicious osseous lesion, or significant
marrow edema.

Cord: No cord signal abnormality identified within limitations of
motion artifact. Normal cord size.

Posterior Fossa, vertebral arteries, paraspinal tissues:
Unremarkable.

Disc levels:

C2-3: Negative.

C3-4: Minimal disc bulging without stenosis.

C4-5: Small right paracentral disc protrusion results in borderline
spinal stenosis without neural foraminal stenosis.

C5-6: Disc bulging and a right paracentral disc protrusion result in
borderline spinal stenosis without neural foraminal stenosis.

C6-7: Negative.

C7-T1: Negative.
IMPRESSION: Small disc protrusions at C4-5 and C5-6 without significant
stenosis.

## 2022-07-29 ENCOUNTER — Encounter: Payer: Self-pay | Admitting: Nurse Practitioner

## 2022-07-29 ENCOUNTER — Ambulatory Visit: Payer: 59 | Admitting: Nurse Practitioner

## 2022-07-29 VITALS — BP 130/70 | Wt 181.6 lb

## 2022-07-29 DIAGNOSIS — N939 Abnormal uterine and vaginal bleeding, unspecified: Secondary | ICD-10-CM

## 2022-07-29 DIAGNOSIS — R053 Chronic cough: Secondary | ICD-10-CM

## 2022-07-29 MED ORDER — FLUTICASONE PROPIONATE HFA 110 MCG/ACT IN AERO: 2.0000 | INHALATION_SPRAY | Freq: Two times a day (BID) | RESPIRATORY_TRACT | 2 refills | Status: DC

## 2022-07-29 MED ORDER — LO LOESTRIN FE 1 MG-10 MCG / 10 MCG PO TABS: 1.0000 | ORAL_TABLET | Freq: Every day | ORAL | 11 refills | Status: DC

## 2022-07-29 NOTE — Progress Notes (Cosign Needed Addendum)
   Subjective:    Patient ID: Emily Braun, female    DOB: Mar 16, 1976, 46 y.o.   MRN: [id number]  HPI Pt arrives to discuss menstrual cycle. Pt states this issue has happened before. Pt states her cycle is on and off. Last cycle was 3 days over Thanksgiving; 2 weeks before that she had a full cycle.   HPI: Episodic visit for AUB. Reports scant intermittent vaginal bleeding for one to two days weekly with regular periods every 4 weeks. Denies abdominal pain. Denies dysuria or issues with bowel movements. Is not currently sexually active. Taking Micronor as ordered. Denies fatigue or s/s of anemia. Does not smoke. No family hx of breast CA. Reports unhappy with recent weight gain.   Review of Systems  Constitutional:  Positive for unexpected weight change.  Respiratory:  Positive for cough. Negative for shortness of breath and wheezing.   Cardiovascular:  Negative for chest pain and palpitations.  Gastrointestinal:  Negative for abdominal pain, constipation, diarrhea, nausea and vomiting.  Genitourinary:  Positive for menstrual problem and vaginal bleeding. Negative for dysuria, flank pain, frequency, genital sores, hematuria and pelvic pain.  Neurological:  Negative for dizziness, weakness and headaches.  Psychiatric/Behavioral:  Negative for sleep disturbance.   All other systems reviewed and are negative.     Objective:    Vitals:   07/29/22 1337  BP: 130/70  Weight: 82.4 kg      Physical Exam Constitutional:      Appearance: Normal appearance.  Cardiovascular:     Rate and Rhythm: Normal rate.     Heart sounds: Normal heart sounds.  Pulmonary:     Breath sounds: Normal breath sounds.  Abdominal:     General: Abdomen is flat.     Palpations: Abdomen is soft.     Tenderness: There is no abdominal tenderness.  Musculoskeletal:        General: No swelling.  Skin:    Coloration: Skin is not pale.  Neurological:     Mental Status: She is alert and oriented to person, place,  and time.  Psychiatric:        Mood and Affect: Mood normal.       Assessment & Plan:   Meds ordered this encounter  Medications   Norethindrone-Ethinyl Estradiol-Fe Biphas (LO LOESTRIN FE) 1 MG-10 MCG / 10 MCG tablet    Sig: Take 1 tablet by mouth daily.    Dispense:  28 tablet    Refill:  11    Order Specific Question:   Supervising Provider    Answer:   Sallee Lange A [[id number]]   fluticasone (FLOVENT HFA) 110 MCG/ACT inhaler    Sig: Inhale 2 puffs into the lungs 2 (two) times daily.    Dispense:  1 each    Refill:  2    Order Specific Question:   Supervising Provider    Answer:   Sallee Lange A [[id number]]    Plan: Recommend Low dose estrogen for vaginal bleeding. Recommend inhaled low dose steroid for possible exercise induced steroid. Medication instruction reviewed including s/s to report with patient understanding verbalized. Return follow up if symptoms worsen or persist.

## 2022-07-31 ENCOUNTER — Encounter: Payer: Self-pay | Admitting: Nurse Practitioner

## 2022-07-31 DIAGNOSIS — N939 Abnormal uterine and vaginal bleeding, unspecified: Secondary | ICD-10-CM | POA: Insufficient documentation

## 2022-07-31 NOTE — Progress Notes (Signed)
Patient ID: Emily Braun, female   DOB: Mar 30, 1976, 46 y.o.   MRN: [id number]

## 2022-08-04 ENCOUNTER — Other Ambulatory Visit: Payer: Self-pay | Admitting: Family Medicine

## 2022-08-10 ENCOUNTER — Other Ambulatory Visit: Payer: Self-pay | Admitting: Family Medicine

## 2022-08-24 ENCOUNTER — Other Ambulatory Visit: Payer: Self-pay | Admitting: Family Medicine

## 2022-08-31 ENCOUNTER — Other Ambulatory Visit: Payer: Self-pay | Admitting: Family Medicine

## 2022-09-04 ENCOUNTER — Other Ambulatory Visit: Payer: Self-pay | Admitting: Family Medicine

## 2022-09-23 ENCOUNTER — Other Ambulatory Visit: Payer: Self-pay | Admitting: Family Medicine

## 2022-10-02 ENCOUNTER — Other Ambulatory Visit: Payer: Self-pay | Admitting: Family Medicine

## 2022-10-11 ENCOUNTER — Telehealth: Payer: Self-pay | Admitting: Family Medicine

## 2022-10-11 NOTE — Telephone Encounter (Signed)
Pt called in and states insurance is no longer going to pay for Flovent inhaler. Pt has only been using it once a day instead of BID as directed by Hoyle Sauer. Please advise. Thank you.

## 2022-10-11 NOTE — Telephone Encounter (Signed)
Please see if we can find out what would be covered in the place of Flovent if that cannot be figured out then I guess send it back to me and we will try something else

## 2022-10-11 NOTE — Telephone Encounter (Signed)
Sent my chart message to patient to see if she knew what was covered. Looked online at formulary but unsure of what was covered.

## 2022-10-13 ENCOUNTER — Encounter: Payer: Self-pay | Admitting: Neurology

## 2022-10-13 ENCOUNTER — Ambulatory Visit: Payer: 59 | Admitting: Neurology

## 2022-10-13 VITALS — BP 150/87 | HR 71 | Ht 62.0 in | Wt 186.0 lb

## 2022-10-13 DIAGNOSIS — G4733 Obstructive sleep apnea (adult) (pediatric): Secondary | ICD-10-CM | POA: Diagnosis not present

## 2022-10-13 DIAGNOSIS — G43009 Migraine without aura, not intractable, without status migrainosus: Secondary | ICD-10-CM

## 2022-10-13 DIAGNOSIS — M542 Cervicalgia: Secondary | ICD-10-CM

## 2022-10-13 DIAGNOSIS — G4489 Other headache syndrome: Secondary | ICD-10-CM | POA: Diagnosis not present

## 2022-10-13 NOTE — Progress Notes (Signed)
[facility]  PATIENT: Emily Braun DOB: 1976-01-05  REFERRING DOCTOR OR PCP: Sallee Lange SOURCE: Patient, notes from primary care, imaging reports.  _________________________________   HISTORICAL  CHIEF COMPLAINT:  Chief Complaint  Patient presents with   Follow-up    Pt in room 10, here for migraines. Pt reports no migraine past 2-3 weeks, 2-3 headaches per day, believes it related to season change. Pt has headache now. Pt uses mouth guard cpap.    HISTORY OF PRESENT ILLNESS:   Emily Braun is a 47 year old woman with headaches and sleep apnea.  Update 10/13/2022 She is experiencing daily mild tension type HA but migraines are much better,   When a headache occurs she takes Tylenol or Advil migraine.   The tension type headaches are occurring a couple times a day.  She is taking 2-8 daily the last 2-3 weeks.  She has not had severe migraines recently (has some back in April 2023) with N/V, photophobia and phonophobia.     She was diagnosed with OSA 04/2021 and has worn a mouth guard for a hle but stopped .   She is  reluctant to use CPAP as she has a one year old old and is concerned about hearing the child.   Study was done through [facility] [place] (Dr. Johney Maine (DDS))  She hsa not had any more diplopia.   She was told she had  some EOM misalignment.   She had an MRI of the brain 03/07/2022 (normal, I concur).    I previousl saw her for CTS.  NCV showed moderate right and mild left CTS.   She had right CTR nd an injeciton on the left at the same time and is much better,   She was on [facility] but had vomiting and stopped.   She resumed more recently and is tolerating it better so restarted at a lower dose.      Impression: This NCV/EMG study shows neurophysiologic evidence of: 1.   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 2.   Mild median neuropathy across the left wrist 3.   Probable mild left C8 and possible minimal left  C5 or C6 chronic radiculopathies   REVIEW OF SYSTEMS: Constitutional: No fevers, chills, sweats, or change in appetite Eyes: No visual changes, double vision, eye pain Ear, nose and throat: No hearing loss, ear pain, nasal congestion, sore throat Cardiovascular: No chest pain, palpitations Respiratory:  No shortness of breath at rest or with exertion.   No wheezes GastrointestinaI: No nausea, vomiting, diarrhea.   she has GERD.   Genitourinary:  No dysuria.  She has urinary frequency. Musculoskeletal:  No neck pain, back pain Integumentary: No rash, pruritus, skin lesions Neurological: as above Psychiatric: No depression at this time.  No anxiety Endocrine: No palpitations, diaphoresis, change in appetite, change in weigh or increased thirst Hematologic/Lymphatic:  No anemia, purpura, petechiae. Allergic/Immunologic: No itchy/runny eyes, nasal congestion, recent allergic reactions, rashes  ALLERGIES: Allergies  Allergen Reactions   Bydureon [Exenatide] Itching    Caused knots in her skin as well as itching at the site of injection   Metformin And Related Diarrhea   Tradjenta [Linagliptin]     Patient relates abdominal cramps and diarrhea    HOME MEDICATIONS:  Current Outpatient Medications:    Calcium Carbonate-Vitamin D (CALCIUM + D PO), Take 1 tablet by mouth daily., Disp: , Rfl:    citalopram (CELEXA) 20 MG tablet, Take 1 tablet by mouth  once daily, Disp: 90 tablet, Rfl: 2   fluticasone (FLONASE) 50 MCG/ACT nasal spray, Use 2 spray(s) in each nostril once daily, Disp: 16 g, Rfl: 0   fluticasone (FLOVENT HFA) 110 MCG/ACT inhaler, Inhale 2 puffs into the lungs 2 (two) times daily., Disp: 1 each, Rfl: 2   LANTUS SOLOSTAR 100 UNIT/ML Solostar Pen, Inject 20 Units into the skin at bedtime., Disp: , Rfl:    lisinopril (ZESTRIL) 10 MG tablet, Take 1 tablet by mouth once daily, Disp: 90 tablet, Rfl: 0   loratadine (CLARITIN) 10 MG tablet, Take 10 mg by mouth at bedtime. , Disp: ,  Rfl:    Norethindrone-Ethinyl Estradiol-Fe Biphas (LO LOESTRIN FE) 1 MG-10 MCG / 10 MCG tablet, Take 1 tablet by mouth daily., Disp: 28 tablet, Rfl: 11   oxybutynin (DITROPAN) 5 MG tablet, Take 5 mg by mouth 2 (two) times daily., Disp: , Rfl:    pantoprazole (PROTONIX) 40 MG tablet, TAKE 1 TABLET BY MOUTH ONCE DAILY FOR  ACID  REFLUX, Disp: 90 tablet, Rfl: 0   pravastatin (PRAVACHOL) 20 MG tablet, TAKE 1 TABLET BY MOUTH ONCE DAILY ON MONDAYS AND FRIDAYS, Disp: 24 tablet, Rfl: 3   rizatriptan (MAXALT-MLT) 10 MG disintegrating tablet, Take 1 tablet (10 mg total) by mouth as needed for migraine. May repeat in 2 hours if needed, Disp: 9 tablet, Rfl: 11   tirzepatide (MOUNJARO) 5 MG/0.5ML Pen, INJECT 5 MG INTO THE SKIN  ONCE A WEEK, Disp: 2 mL, Rfl: 0   triamcinolone cream (KENALOG) 0.1 %, Apply 1 application topically 2 (two) times daily. (Patient taking differently: Apply 1 application  topically 2 (two) times daily as needed (irritation).), Disp: 30 g, Rfl: 1  PAST MEDICAL HISTORY: Past Medical History:  Diagnosis Date   Adhesive capsulitis of left shoulder 06/02/2021   Anxiety    Arthritis    Cancer ([place])    skin cancer removed from back   Carpal tunnel syndrome, left    Depression    Family history of adverse reaction to anesthesia    brother fights when wakes up   Fracture of humerus, proximal, left, closed 06/21/2020   GERD (gastroesophageal reflux disease)    Headache    History of hiatal hernia    Hypertension    followed by pcp   (06-25-2020 pt stated never had stress test)   IBS (irritable bowel syndrome)    Mixed stress and urge urinary incontinence    urologist--- dr Matilde Sprang   Pre-diabetes    Seasonal allergies    Sleep apnea 06/02/2021   no cpap used osa per sleep study   Wears glasses     PAST SURGICAL HISTORY: Past Surgical History:  Procedure Laterality Date   COLONOSCOPY WITH PROPOFOL     02/22/2013  dr [name]   ESOPHAGOGASTRODUODENOSCOPY (EGD) WITH  PROPOFOL     08-08-2016  dr Loletha Carrow   HARDWARE REMOVAL Left 05/11/2022   Procedure: HARDWARE REMOVAL;  Surgeon: Hiram Gash, MD;  Location: [facility];  Service: Orthopedics;  Laterality: Left;   LAPAROSCOPIC CHOLECYSTECTOMY  09-10-2007  @[facility]    laparoscopic   MAXILLARY ANTROSTOMY Left 03/10/2014   Procedure: LEFT MAXILLARY ANTROSTOMY;  Surgeon: Ascencion Dike, MD;  Location: [place];  Service: ENT;  Laterality: Left;   ORIF HUMERUS FRACTURE Left 06/30/2020   Procedure: OPEN REDUCTION INTERNAL FIXATION (ORIF) PROXIMAL HUMERUS FRACTURE;  Surgeon: [name], MD;  Location: [place];  Service: Orthopedics;  Laterality: Left;   ORIF  PATELLA FRACTURE Left 2011   REVERSE SHOULDER ARTHROPLASTY Left 05/11/2022   Procedure: REVERSE SHOULDER ARTHROPLASTY;  Surgeon: Hiram Gash, MD;  Location: [facility];  Service: Orthopedics;  Laterality: Left;   SHOULDER CLOSED REDUCTION Left 06/08/2021   Procedure: CLOSED MANIPULATION SHOULDER, WITH INJECTION;  Surgeon: [name], MD;  Location: [place];  Service: Orthopedics;  Laterality: Left;   WISDOM TOOTH EXTRACTION     yrs ago per pt on 06-02-2021    FAMILY HISTORY: Family History  Problem Relation Age of Onset   Diabetes Father    Hypertension Father    Diabetes Maternal Grandmother    Diabetes Paternal Grandmother    Hypertension Mother    Cancer - Other Brother        testicular   Esophageal cancer Neg Hx     SOCIAL HISTORY:  Social History   Socioeconomic History   Marital status: Single    Spouse name: Not on file   Number of children: 1   Years of education: Not on file   Highest education level: Not on file  Occupational History   Occupation: patient coordinator    Comment: [employer]  Tobacco Use   Smoking status: Never   Smokeless tobacco: Never  Vaping Use   Vaping Use: Never used  Substance and Sexual Activity   Alcohol use: Not Currently     Comment: socially   Drug use: Never   Sexual activity: Yes    Birth control/protection: None  Other Topics Concern   Not on file  Social History Narrative   Lives with foster-son   Caffeine use: sometimes   Right handed    Social Determinants of Health   Financial Resource Strain: Not on file  Food Insecurity: Not on file  Transportation Needs: Not on file  Physical Activity: Not on file  Stress: Not on file  Social Connections: Not on file  Intimate Partner Violence: Not on file     PHYSICAL EXAM  Vitals:   10/13/22 1545  BP: (!) 150/87  Pulse: 71  Weight: 186 lb (84.4 kg)  Height: 5' 2"$  (1.575 m)    Body mass index is 34.02 kg/m.   General: The patient is well-developed and well-nourished and in no acute distress.        Neck:  The neck is tender over the occiput bilaterally.  Range of motion is normal. Extremities: She has a right carpal tunnel release scar from surgery since her last visit   Neurologic Exam  Mental status: The patient is alert and oriented x 3 at the time of the examination.    Speech is normal.  Cranial nerves: Extraocular movements are full.    Motor:  Muscle bulk is normal.   Tone is normal. Strength is  5 / 5 in all 4 extremities including right abductor pollicis brevis.   Sensory: Sensory testing is intact to pinprick, soft touch and vibration sensation in the arms now  Gait and station: Station is normal.   Gait is normal.   Reflexes: Deep tendon reflexes are symmetric and normal in the arms  Other: Tinel's signs were present at both wrist and Phalen's sign was present on the right      ASSESSMENT AND PLAN  1. Neck pain   2. Other headache syndrome   3. OSA (obstructive sleep apnea)   4. Migraine without aura and without status migrainosus, not intractable      1.  Bilateral splenius capitus  TPI with 80 mg DepoMedrol in [place] using sterile technique.   She tolerated the procedure well and there were no complications.   .   2.   If  HA frequency worsens, consider zonisamide for migraine prophylaxis.  Also consider meloxicam as may also helpTMJ 3.   For OSA, try weight loss.  She has just started [facility] so hopefully this will help.   If unable to lose weight consider APAP (will need to get HST report).    4.   Rtc as needed and is advised to call us back if HA worsens or she wants  to reconsider CPAP could be considered.  She is advised to let us know if she has any new or worsening neurologic symptoms.   Bijal Siglin A. Felecia Shelling, MD, PhD, Charlynn Grimes [id number], [id number] PM Certified in Neurology, Clinical Neurophysiology, Sleep Medicine and Neuroimaging  [facility] [address], [place], [place] [address] [phone]

## 2022-10-21 ENCOUNTER — Telehealth (INDEPENDENT_AMBULATORY_CARE_PROVIDER_SITE_OTHER): Payer: 59 | Admitting: Nurse Practitioner

## 2022-10-21 DIAGNOSIS — J45991 Cough variant asthma: Secondary | ICD-10-CM | POA: Diagnosis not present

## 2022-10-21 MED ORDER — BUDESONIDE-FORMOTEROL FUMARATE 80-4.5 MCG/ACT IN AERO: 2.0000 | INHALATION_SPRAY | Freq: Two times a day (BID) | RESPIRATORY_TRACT | 5 refills | Status: AC

## 2022-10-21 NOTE — Telephone Encounter (Signed)
Patient has visit with Hoyle Sauer NP to discuss options today (10/21/22)

## 2022-10-21 NOTE — Progress Notes (Unsigned)
   Subjective:    Patient ID: Emily Braun, female    DOB: Jul 09, 1976, 47 y.o.   MRN: [id number]  HPI Patient to discuss options for her inhaler. Insurance is no longer paying for [employer] Visit via Video Note  I connected with [name] on 10/21/22 at  3:00 PM EST by a video enabled telemedicine application and verified that I am speaking with the correct person using two identifiers.  Location: Patient: home Provider: office   I discussed the limitations of evaluation and management by telemedicine and the availability of in person appointments. The patient expressed understanding and agreed to proceed.  History of Present Illness: Cough is much better with Flovent. Is not using Albuterol. Her Flovent cost has gone up significantly with new insurance. Has checked into other inhalers and still very expensive. No fever, CP or SOB.    Observations/Objective: NAD. Alert, oriented. No SOB noted while talking.  Assessment and Plan: 1. Cough variant asthma Meds ordered this encounter  Medications   budesonide-formoterol (SYMBICORT) 80-4.5 MCG/ACT inhaler    Sig: Inhale 2 puffs into the lungs 2 (two) times daily.    Dispense:  1 each    Refill:  5    Order Specific Question:   Supervising Provider    Answer:   Sallee Lange A [[id number]]  Significant time spent during visit exploring options for out of pocket costs and resources.  Advised patient to use GoodRx app and try generic Symbicort as directed. Use BID for maintenance as needed and also prn in place of Albuterol.    Follow Up Instructions:  I discussed the assessment and treatment plan with the patient. The patient was provided an opportunity to ask questions and all were answered. The patient agreed with the plan and demonstrated an understanding of the instructions.   The patient was advised to call back or seek an in-person evaluation if the symptoms worsen or if the condition fails to improve as  anticipated.  I provided 15 minutes of non-face-to-face time during this encounter.     Review of Systems     Objective:   Physical Exam        Assessment & Plan:

## 2022-10-22 ENCOUNTER — Encounter: Payer: Self-pay | Admitting: Nurse Practitioner

## 2022-10-22 DIAGNOSIS — J45991 Cough variant asthma: Secondary | ICD-10-CM | POA: Insufficient documentation

## 2022-11-01 ENCOUNTER — Telehealth: Payer: Self-pay | Admitting: Family Medicine

## 2022-11-01 NOTE — Telephone Encounter (Signed)
Patient dropped off form this afternoon to be completed in your yellow folder

## 2022-11-01 NOTE — Telephone Encounter (Signed)
Patient dropped off form to be completed in your yellow folder 

## 2022-11-01 NOTE — Telephone Encounter (Signed)
Nurses Patient dropped off a form that needs to be filled out for the Department of Social Services She comes here on a regular basis Please let the patient know we are willing to fill out this form but we do have to verify a few questions (Personally I believe I already know the answers but nonetheless we have to be official) #1 does she feel she has any communicable diseases currently? 2.  Does she have any limitation to her physical activity? 3.  Does she feel she is having any behavioral health or mental health problems currently that would preclude her ability to parent? Please forward these answers back to me then we can complete her form thank you

## 2022-11-02 NOTE — Telephone Encounter (Signed)
Spoke with patient :  1- No 2- No 3- No

## 2022-11-03 NOTE — Telephone Encounter (Signed)
Nurses-please check nurses station to see if this form is there I have not seen this form on my desk any longer It is possible it is in the nurses station or possibly Danae Chen pick that up   It was a physical form for social services/physical wellbeing etc. I know I filled out the form I just do not see it in my office thank you

## 2022-11-05 ENCOUNTER — Other Ambulatory Visit: Payer: Self-pay | Admitting: Family Medicine

## 2022-11-08 NOTE — Telephone Encounter (Signed)
Touch base with patient If she is tolerating the current dose and wants to go up to the next higher dose, the next higher dose is 7.5 mg once a week she can have 1 month supply with 2 refills  If she desires to stay at the current dosing I recommend 5 mg with 1 month supply and 2 refills

## 2022-11-08 NOTE — Telephone Encounter (Signed)
Called left message for patient to call office 

## 2022-11-10 NOTE — Telephone Encounter (Signed)
Pt said she wanted to stay at 5 mg the 7.5 was to strong

## 2022-11-24 ENCOUNTER — Other Ambulatory Visit: Payer: Self-pay | Admitting: Family Medicine

## 2022-11-30 ENCOUNTER — Ambulatory Visit: Payer: 59 | Admitting: Family Medicine

## 2022-11-30 ENCOUNTER — Encounter: Payer: Self-pay | Admitting: Family Medicine

## 2022-11-30 VITALS — BP 129/89 | HR 73 | Temp 97.2°F | Ht 62.0 in | Wt 181.0 lb

## 2022-11-30 DIAGNOSIS — E1169 Type 2 diabetes mellitus with other specified complication: Secondary | ICD-10-CM | POA: Diagnosis not present

## 2022-11-30 DIAGNOSIS — R112 Nausea with vomiting, unspecified: Secondary | ICD-10-CM

## 2022-11-30 DIAGNOSIS — R1013 Epigastric pain: Secondary | ICD-10-CM

## 2022-11-30 DIAGNOSIS — I10 Essential (primary) hypertension: Secondary | ICD-10-CM

## 2022-11-30 DIAGNOSIS — E785 Hyperlipidemia, unspecified: Secondary | ICD-10-CM

## 2022-11-30 DIAGNOSIS — Z794 Long term (current) use of insulin: Secondary | ICD-10-CM

## 2022-11-30 MED ORDER — TRIAMCINOLONE ACETONIDE 0.1 % EX CREA: 1.0000 | TOPICAL_CREAM | Freq: Two times a day (BID) | CUTANEOUS | 1 refills | Status: DC

## 2022-11-30 MED ORDER — PANTOPRAZOLE SODIUM 40 MG PO TBEC: DELAYED_RELEASE_TABLET | ORAL | 1 refills | Status: DC

## 2022-11-30 MED ORDER — PRAVASTATIN SODIUM 20 MG PO TABS: ORAL_TABLET | ORAL | 3 refills | Status: DC

## 2022-11-30 MED ORDER — LISINOPRIL 10 MG PO TABS: 10.0000 mg | ORAL_TABLET | Freq: Every day | ORAL | 3 refills | Status: DC

## 2022-11-30 MED ORDER — FLUTICASONE PROPIONATE 50 MCG/ACT NA SUSP: NASAL | 3 refills | Status: DC

## 2022-11-30 NOTE — Progress Notes (Signed)
Subjective:    Patient ID: Emily Braun, female    DOB: 10-09-1975, 47 y.o.   MRN: [id number]  Diabetes She presents for her follow-up diabetic visit. She has type 2 diabetes mellitus. Current diabetic treatments: mounjaro, lantus.   Patient reports some nausea, vomiting, diarrhea on mounjaro 5 mg Type 2 diabetes mellitus without complication, with long-term current use of insulin - Plan: Hemoglobin 123456, Basic Metabolic Panel, CBC with Differential, Microalbumin/Creatinine Ratio, Urine  Essential hypertension, benign  Hyperlipidemia associated with type 2 diabetes mellitus - Plan: Lipid Panel  Nausea and vomiting, unspecified vomiting type - Plan: US Abdomen Limited RUQ (LIVER/GB)  Epigastric pain - Plan: Hepatic Function Panel, Amylase, Lipase, US Abdomen Limited RUQ (LIVER/GB)  The patient was seen today as part of a comprehensive diabetic check up. Patient has diabetes Patient relates good compliance with taking the medication. We discussed their diet and exercise activities  We also discussed the importance of notifying us if any excessively high glucoses or low sugars.    Patient here for follow-up regarding cholesterol.    Patient relates taking medication on a regular basis Denies problems with medication Importance of dietary measures discussed Regular lab work regarding lipid and liver was checked and if needing additional labs was appropriately ordered  Patient for blood pressure check up.  The patient does have hypertension.   Patient relates dietary measures try to minimize salt The importance of healthy diet and activity were discussed Patient relates compliance  Patient with epigastric pain discomfort nausea diarrhea vomiting this is been over the past 2 to 3 days she is on Mounjaro 5 mg it is possible this milligram is causing her some troubles  Review of Systems     Objective:   Physical Exam  General-in no acute distress Eyes-no  discharge Lungs-respiratory rate normal, CTA CV-no murmurs,RRR Extremities skin warm dry no edema Neuro grossly normal Behavior normal, alert Mild epigastric tenderness no guarding or rebound Patient had cholecystectomy in 2007     Assessment & Plan:   1. Type 2 diabetes mellitus without complication, with long-term current use of insulin The patient was seen today as part of a comprehensive visit for diabetes. The importance of keeping her A1c at or below 7 range was discussed.  Discussed diet, activity, and medication compliance Emphasized healthy eating primarily with vegetables fruits and if utilizing meats lean meats such as chicken or fish grilled baked broiled Avoid sugary drinks Minimize and avoid processed foods Fit in regular physical activity preferably 25 to 30 minutes 4 times per week Standard follow-up visit recommended.  Patient aware lack of control and follow-up increases risk of diabetic complications. Regular follow-up visits Yearly ophthalmology Yearly foot exam  - Hemoglobin 123456 - Basic Metabolic Panel - CBC with Differential - Microalbumin/Creatinine Ratio, Urine  2. Essential hypertension, benign HTN- patient seen for follow-up regarding HTN.   Diet, medication compliance, appropriate labs and refills were completed.   Importance of keeping blood pressure under good control to lessen the risk of complications discussed Regular follow-up visits discussed   3. Hyperlipidemia associated with type 2 diabetes mellitus Hyperlipidemia-importance of diet, weight control, activity, compliance with medications discussed.   Recent labs reviewed.   Any additional labs or refills ordered.   Importance of keeping under good control discussed. Regular follow-up visits discussed  - Lipid Panel  4. Nausea and vomiting, unspecified vomiting type Given her symptoms we need to do an ultrasound to look at the liver area as well as common  bile duct as well as  pancreas - US Abdomen Limited RUQ (LIVER/GB)  5. Epigastric pain Lab work ordered patient will keep [place] updated if she has ongoing symptoms with the next injection strongly consider reducing Mounjaro - Hepatic Function Panel - Amylase - Lipase - US Abdomen Limited RUQ (LIVER/GB) Follow-up by fall time keep [place] posted on her symptoms await the results of the labs and ultrasound

## 2022-12-01 LAB — CBC WITH DIFFERENTIAL/PLATELET
Basophils Absolute: 0 10*3/uL (ref 0.0–0.2)
Basos: 0 %
EOS (ABSOLUTE): 0.5 10*3/uL — ABNORMAL HIGH (ref 0.0–0.4)
Eos: 7 %
Hematocrit: 46.3 % (ref 34.0–46.6)
Hemoglobin: 15.5 g/dL (ref 11.1–15.9)
Immature Grans (Abs): 0 10*3/uL (ref 0.0–0.1)
Immature Granulocytes: 0 %
Lymphocytes Absolute: 1.5 10*3/uL (ref 0.7–3.1)
Lymphs: 18 %
MCH: 30 pg (ref 26.6–33.0)
MCHC: 33.5 g/dL (ref 31.5–35.7)
MCV: 90 fL (ref 79–97)
Monocytes Absolute: 0.5 10*3/uL (ref 0.1–0.9)
Monocytes: 7 %
Neutrophils Absolute: 5.6 10*3/uL (ref 1.4–7.0)
Neutrophils: 68 %
Platelets: 332 10*3/uL (ref 150–450)
RBC: 5.17 x10E6/uL (ref 3.77–5.28)
RDW: 12 % (ref 11.7–15.4)
WBC: 8.2 10*3/uL (ref 3.4–10.8)

## 2022-12-01 LAB — LIPID PANEL
Chol/HDL Ratio: 1.8 ratio (ref 0.0–4.4)
Cholesterol, Total: 107 mg/dL (ref 100–199)
HDL: 59 mg/dL (ref >39)
LDL Chol Calc (NIH): 37 mg/dL (ref 0–99)
Triglycerides: 41 mg/dL (ref 0–149)
VLDL Cholesterol Cal: 11 mg/dL (ref 5–40)

## 2022-12-01 LAB — BASIC METABOLIC PANEL
BUN/Creatinine Ratio: 14 (ref 9–23)
BUN: 12 mg/dL (ref 6–24)
CO2: 23 mmol/L (ref 20–29)
Calcium: 9.7 mg/dL (ref 8.7–10.2)
Chloride: 103 mmol/L (ref 96–106)
Creatinine, Ser: 0.88 mg/dL (ref 0.57–1.00)
Glucose: 100 mg/dL — ABNORMAL HIGH (ref 70–99)
Potassium: 3.9 mmol/L (ref 3.5–5.2)
Sodium: 142 mmol/L (ref 134–144)
eGFR: 83 mL/min/{1.73_m2} (ref >59)

## 2022-12-01 LAB — HEPATIC FUNCTION PANEL
ALT: 33 IU/L — ABNORMAL HIGH (ref 0–32)
AST: 36 IU/L (ref 0–40)
Albumin: 4.2 g/dL (ref 3.9–4.9)
Alkaline Phosphatase: 88 IU/L (ref 44–121)
Bilirubin Total: 0.4 mg/dL (ref 0.0–1.2)
Bilirubin, Direct: 0.16 mg/dL (ref 0.00–0.40)
Total Protein: 6.9 g/dL (ref 6.0–8.5)

## 2022-12-01 LAB — AMYLASE: Amylase: 21 U/L — ABNORMAL LOW (ref 31–110)

## 2022-12-01 LAB — MICROALBUMIN / CREATININE URINE RATIO
Creatinine, Urine: 345.7 mg/dL
Microalb/Creat Ratio: 4 mg/g creat (ref 0–29)
Microalbumin, Urine: 14.6 ug/mL

## 2022-12-01 LAB — LIPASE: Lipase: 18 U/L (ref 14–72)

## 2022-12-01 LAB — HEMOGLOBIN A1C
Est. average glucose Bld gHb Est-mCnc: 120 mg/dL
Hgb A1c MFr Bld: 5.8 % — ABNORMAL HIGH (ref 4.8–5.6)

## 2022-12-12 ENCOUNTER — Ambulatory Visit (HOSPITAL_COMMUNITY)
Admission: RE | Admit: 2022-12-12 | Discharge: 2022-12-12 | Disposition: A | Discharge status: Home / Self Care | Payer: 59 | Source: Ambulatory Visit | Attending: Family Medicine | Admitting: Family Medicine

## 2022-12-12 DIAGNOSIS — R1013 Epigastric pain: Secondary | ICD-10-CM | POA: Diagnosis present

## 2022-12-12 DIAGNOSIS — R112 Nausea with vomiting, unspecified: Secondary | ICD-10-CM | POA: Diagnosis present

## 2022-12-14 ENCOUNTER — Encounter: Payer: Self-pay | Admitting: *Deleted

## 2022-12-25 ENCOUNTER — Other Ambulatory Visit: Payer: Self-pay | Admitting: Family Medicine

## 2022-12-27 ENCOUNTER — Other Ambulatory Visit: Payer: Self-pay | Admitting: Family Medicine

## 2023-01-20 ENCOUNTER — Ambulatory Visit: Payer: 59 | Admitting: Nurse Practitioner

## 2023-01-20 VITALS — BP 134/80 | HR 91 | Wt 182.0 lb

## 2023-01-20 DIAGNOSIS — R112 Nausea with vomiting, unspecified: Secondary | ICD-10-CM

## 2023-01-20 DIAGNOSIS — T887XXA Unspecified adverse effect of drug or medicament, initial encounter: Secondary | ICD-10-CM | POA: Diagnosis not present

## 2023-01-20 HISTORY — DX: Unspecified adverse effect of drug or medicament, initial encounter: T88.7XXA

## 2023-01-20 MED ORDER — SEMAGLUTIDE(0.25 OR 0.5MG/DOS) 2 MG/3ML ~~LOC~~ SOPN: PEN_INJECTOR | SUBCUTANEOUS | 2 refills | Status: DC

## 2023-01-20 NOTE — Progress Notes (Unsigned)
   Subjective:    Patient ID: Emily Braun, female    DOB: 02-May-1976, 47 y.o.   MRN: [id number]  HPI Presents for complaints of nausea and vomiting for the first 2 to 3 days after each Mounjaro injection.  Has been intense at times, missed a day of work recently due to the severity of her symptoms.  Has also had some slight diarrhea at times.  Episodes of belching with an odd taste like "peanut butter" which makes her more nauseated.  States the symptoms will wear off after 2 to 3 days and then she feels fine.  But the symptoms are intense enough where it is interfered with her daily living.  Has been doing much better with her diet activity and maintaining her weight loss.   Review of Systems  Constitutional:  Positive for fatigue.  Respiratory:  Negative for chest tightness and shortness of breath.   Cardiovascular:  Negative for chest pain.  Gastrointestinal:  Positive for abdominal distention, diarrhea, nausea and vomiting.       Abdominal cramping along with other symptoms.       Objective:   Physical Exam NAD.  Alert, oriented.  Calm cheerful affect.  Lungs clear.  Heart regular rate rhythm.  Abdomen soft nondistended nontender. Today's Vitals   01/20/23 1536  BP: 134/80  Pulse: 91  SpO2: 98%  Weight: 182 lb (82.6 kg)   Body mass index is 33.29 kg/m.        Assessment & Plan:   Problem List Items Addressed This Visit       Digestive   Nausea and vomiting - Primary     Other   Medication side effect   Meds ordered this encounter  Medications   Semaglutide,0.25 or 0.5MG /DOS, 2 MG/3ML SOPN    Sig: Inject 0.25 mg in the skin once a week; after 2 weeks, may increase to 0.5 mg once weekly    Dispense:  3 mL    Refill:  2    Order Specific Question:   Supervising Provider    Answer:   Lilyan Punt A [[id number]]   Discussed options.  Patient wishes to start [facility] and try Ozempic.  Plans to stay off all GLP-1 meds for at least a week before starting  Ozempic. Reviewed potential adverse effects.  Discontinue medication and contact office if any problems.  Encourage patient to continue healthy lifestyle habits. Follow-up with Dr. Lorin Picket in October as planned.  Call back sooner if needed.

## 2023-01-21 ENCOUNTER — Encounter: Payer: Self-pay | Admitting: Nurse Practitioner

## 2023-01-31 ENCOUNTER — Ambulatory Visit: Payer: 59 | Admitting: Family Medicine

## 2023-03-20 ENCOUNTER — Other Ambulatory Visit: Payer: Self-pay | Admitting: Family Medicine

## 2023-03-20 DIAGNOSIS — Z794 Long term (current) use of insulin: Secondary | ICD-10-CM

## 2023-03-21 ENCOUNTER — Telehealth: Payer: Self-pay | Admitting: Family Medicine

## 2023-03-21 DIAGNOSIS — Z0279 Encounter for issue of other medical certificate: Secondary | ICD-10-CM

## 2023-03-21 NOTE — Telephone Encounter (Signed)
Patient dropped off form to be completed in your box with pay when form is done and ready for pick-up

## 2023-03-21 NOTE — Telephone Encounter (Signed)
Form was filled out-please put in the highlighted areas

## 2023-03-21 NOTE — Telephone Encounter (Signed)
It should be noted that the patient does have underlying diabetes but this is stable She also is on Celexa but emotionally she is stable.  She will do fine from a social service standpoint on her exam.  Form filled out.

## 2023-03-22 NOTE — Telephone Encounter (Signed)
Upfront  for pick up.03/22/2023

## 2023-03-26 ENCOUNTER — Other Ambulatory Visit: Payer: Self-pay | Admitting: Family Medicine

## 2023-04-11 ENCOUNTER — Encounter: Payer: Self-pay | Admitting: Family Medicine

## 2023-04-11 ENCOUNTER — Encounter: Payer: Self-pay | Admitting: Physician Assistant

## 2023-04-20 ENCOUNTER — Encounter: Payer: Self-pay | Admitting: Neurology

## 2023-04-20 ENCOUNTER — Ambulatory Visit: Payer: 59 | Admitting: Neurology

## 2023-04-20 VITALS — BP 154/89 | HR 65 | Ht 62.0 in | Wt 191.6 lb

## 2023-04-20 DIAGNOSIS — M542 Cervicalgia: Secondary | ICD-10-CM

## 2023-04-20 DIAGNOSIS — G43009 Migraine without aura, not intractable, without status migrainosus: Secondary | ICD-10-CM

## 2023-04-20 DIAGNOSIS — G4489 Other headache syndrome: Secondary | ICD-10-CM

## 2023-04-20 DIAGNOSIS — G4733 Obstructive sleep apnea (adult) (pediatric): Secondary | ICD-10-CM

## 2023-04-20 DIAGNOSIS — G5603 Carpal tunnel syndrome, bilateral upper limbs: Secondary | ICD-10-CM

## 2023-04-20 NOTE — Progress Notes (Signed)
[facility]  PATIENT: Emily Braun DOB: [birth year]  REFERRING DOCTOR OR PCP: Lilyan Punt SOURCE: Patient, notes from primary care, imaging reports.  _________________________________   HISTORICAL  CHIEF COMPLAINT:  Chief Complaint  Patient presents with   Follow-up    Pt in room 11. Here for headache follow up. Pt said headaches are better, still neck there comes and goes.  Pt blood pressure elevated x 2.     HISTORY OF PRESENT ILLNESS:   Emily Braun is a 47 year old woman with headaches and sleep apnea.  Update 04/20/2023 HA's are much better since the TPI/ONB in 09/2022.   She has only had a couple minor HA a month now.   These are better with Tylenol.      She has not had severe migraines with N/V, photophobia and phonophobia.     She was diagnosed with OSA 04/2021.  She used a mouth guard for a while.   She was initially reluctant to use CPAP as she has a young child and she wants to make sure she can hear at night.  However, since she was unable to tolerate the mouthguard she would reconsider.  Study was done through [facility] , [place] (Dr. Michaell Cowing (DDS))  She has had more sleepiness..   She gained some weight and was on Mounjaro initially but felt sick so switched to Ozempic.  She is on the low dose and has not lost weight  She denies any more diplopia.   She was told she had  some EOM misalignment.   She had an MRI of the brain 03/07/2022 (normal, I concur).    I previousl saw her for CTS.  NCV showed moderate right and mild left CTS.   She had right CTR     Impression: This NCV/EMG study shows neurophysiologic evidence of: 1.   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 2.   Mild median neuropathy across the left wrist 3.   Probable mild left C8 and possible minimal left C5 or C6 chronic radiculopathies   REVIEW OF SYSTEMS: Constitutional: No fevers, chills, sweats, or change in appetite Eyes: No visual changes,  double vision, eye pain Ear, nose and throat: No hearing loss, ear pain, nasal congestion, sore throat Cardiovascular: No chest pain, palpitations Respiratory:  No shortness of breath at rest or with exertion.   No wheezes GastrointestinaI: No nausea, vomiting, diarrhea.   she has GERD.   Genitourinary:  No dysuria.  She has urinary frequency. Musculoskeletal:  No neck pain, back pain Integumentary: No rash, pruritus, skin lesions Neurological: as above Psychiatric: No depression at this time.  No anxiety Endocrine: No palpitations, diaphoresis, change in appetite, change in weigh or increased thirst Hematologic/Lymphatic:  No anemia, purpura, petechiae. Allergic/Immunologic: No itchy/runny eyes, nasal congestion, recent allergic reactions, rashes  ALLERGIES: Allergies  Allergen Reactions   Bydureon [Exenatide] Itching    Caused knots in her skin as well as itching at the site of injection   Metformin And Related Diarrhea   Tradjenta [Linagliptin]     Patient relates abdominal cramps and diarrhea    HOME MEDICATIONS:  Current Outpatient Medications:    budesonide-formoterol (SYMBICORT) 80-4.5 MCG/ACT inhaler, Inhale 2 puffs into the lungs 2 (two) times daily., Disp: 1 each, Rfl: 5   Calcium Carbonate-Vitamin D (CALCIUM + D PO), Take 1 tablet by mouth daily., Disp: , Rfl:    citalopram (CELEXA) 20 MG tablet, Take 1 tablet by mouth once daily, Disp:  90 tablet, Rfl: 1   fluticasone (FLONASE) 50 MCG/ACT nasal spray, 2 sprays each nare daily, Disp: 48 g, Rfl: 3   LANTUS SOLOSTAR 100 UNIT/ML Solostar Pen, INJECT 28 UNITS SUBCUTANEOUSLY AT BEDTIME MAY TITRATE UP TO 55 UNITS, Disp: 15 mL, Rfl: 3   lisinopril (ZESTRIL) 10 MG tablet, Take 1 tablet (10 mg total) by mouth daily., Disp: 90 tablet, Rfl: 3   loratadine (CLARITIN) 10 MG tablet, Take 10 mg by mouth at bedtime. , Disp: , Rfl:    Norethindrone-Ethinyl Estradiol-Fe Biphas (LO LOESTRIN FE) 1 MG-10 MCG / 10 MCG tablet, Take 1 tablet by  mouth daily., Disp: 28 tablet, Rfl: 11   oxybutynin (DITROPAN) 5 MG tablet, Take 5 mg by mouth 2 (two) times daily., Disp: , Rfl:    pantoprazole (PROTONIX) 40 MG tablet, TAKE 1 TABLET BY MOUTH ONCE DAILY FOR  ACID  REFLUX, Disp: 90 tablet, Rfl: 1   pravastatin (PRAVACHOL) 20 MG tablet, TAKE 1 TABLET BY MOUTH TWICE A WEEK ON MONDAYS AND FRIDAYS, Disp: 24 tablet, Rfl: 1   rizatriptan (MAXALT-MLT) 10 MG disintegrating tablet, Take 1 tablet (10 mg total) by mouth as needed for migraine. May repeat in 2 hours if needed, Disp: 9 tablet, Rfl: 11   Semaglutide,0.25 or 0.5MG /DOS, 2 MG/3ML SOPN, Inject 0.25 mg in the skin once a week; after 2 weeks, may increase to 0.5 mg once weekly, Disp: 3 mL, Rfl: 2   triamcinolone cream (KENALOG) 0.1 %, Apply 1 Application topically 2 (two) times daily., Disp: 30 g, Rfl: 1  PAST MEDICAL HISTORY: Past Medical History:  Diagnosis Date   Adhesive capsulitis of left shoulder 06/02/2021   Anxiety    Arthritis    Cancer (HCC)    skin cancer removed from back   Carpal tunnel syndrome, left    Depression    Family history of adverse reaction to anesthesia    brother fights when wakes up   Fracture of humerus, proximal, left, closed 06/21/2020   GERD (gastroesophageal reflux disease)    Headache    History of hiatal hernia    Hypertension    followed by pcp   (06-25-2020 pt stated never had stress test)   IBS (irritable bowel syndrome)    Mixed stress and urge urinary incontinence    urologist--- dr Sherron Monday   Pre-diabetes    Seasonal allergies    Sleep apnea 06/02/2021   no cpap used osa per sleep study   Wears glasses     PAST SURGICAL HISTORY: Past Surgical History:  Procedure Laterality Date   CHOLECYSTECTOMY     2007   COLONOSCOPY WITH PROPOFOL     02/22/2013  dr Jarold Motto   ESOPHAGOGASTRODUODENOSCOPY (EGD) WITH PROPOFOL     08-08-2016  dr Myrtie Neither   HARDWARE REMOVAL Left 05/11/2022   Procedure: HARDWARE REMOVAL;  Surgeon: Bjorn Pippin, MD;   Location: [facility];  Service: Orthopedics;  Laterality: Left;   LAPAROSCOPIC CHOLECYSTECTOMY  09-10-2007  @[facility]    laparoscopic   MAXILLARY ANTROSTOMY Left 03/10/2014   Procedure: LEFT MAXILLARY ANTROSTOMY;  Surgeon: Darletta Moll, MD;  Location: [facility];  Service: ENT;  Laterality: Left;   ORIF HUMERUS FRACTURE Left 06/30/2020   Procedure: OPEN REDUCTION INTERNAL FIXATION (ORIF) PROXIMAL HUMERUS FRACTURE;  Surgeon: Sheral Apley, MD;  Location: [facility];  Service: Orthopedics;  Laterality: Left;   ORIF PATELLA FRACTURE Left 2011   REVERSE SHOULDER ARTHROPLASTY Left 05/11/2022   Procedure: REVERSE SHOULDER ARTHROPLASTY;  Surgeon: Bjorn Pippin, MD;  Location: [facility];  Service: Orthopedics;  Laterality: Left;   SHOULDER CLOSED REDUCTION Left 06/08/2021   Procedure: CLOSED MANIPULATION SHOULDER, WITH INJECTION;  Surgeon: Sheral Apley, MD;  Location: [facility];  Service: Orthopedics;  Laterality: Left;   WISDOM TOOTH EXTRACTION     yrs ago per pt on 06-02-2021    FAMILY HISTORY: Family History  Problem Relation Age of Onset   Diabetes Father    Hypertension Father    Diabetes Maternal Grandmother    Diabetes Paternal Grandmother    Hypertension Mother    Cancer - Other Brother        testicular   Esophageal cancer Neg Hx     SOCIAL HISTORY:  Social History   Socioeconomic History   Marital status: Single    Spouse name: Not on file   Number of children: 1   Years of education: Not on file   Highest education level: Not on file  Occupational History   Occupation: patient coordinator    Comment: [employer]  Tobacco Use   Smoking status: Never   Smokeless tobacco: Never  Vaping Use   Vaping status: Never Used  Substance and Sexual Activity   Alcohol use: Not Currently    Comment: socially   Drug use: Never   Sexual activity: Yes    Birth control/protection: None  Other Topics Concern   Not on file   Social History Narrative   Lives with foster-son   Caffeine use: sometimes   Right handed    Social Determinants of Health   Financial Resource Strain: Not on file  Food Insecurity: Not on file  Transportation Needs: Not on file  Physical Activity: Not on file  Stress: Not on file  Social Connections: Unknown (12/31/2021)   [facility]   Social Network    Social Network: Not on file  Intimate Partner Violence: Unknown (12/01/2021)   Received from [facility]   HITS    Physically Hurt: Not on file    Insult or Talk Down To: Not on file    Threaten Physical Harm: Not on file    Scream or Curse: Not on file     PHYSICAL EXAM  Vitals:   04/20/23 1550 04/20/23 1553  BP: (!) 144/84 (!) 154/89  Pulse: 66 65  Weight: 191 lb 10.1 oz (86.9 kg)   Height: 5\' 2"  (1.575 m)     Body mass index is 35.05 kg/m.   General: The patient is well-developed and well-nourished and in no acute distress.        Neck: The neck has good range of motion and is nontender today.  Extremities: She has a right carpal tunnel release scar from surgery since her last visit   Neurologic Exam  Mental status: The patient is alert and oriented x 3 at the time of the examination.    Speech is normal.  Cranial nerves: Extraocular movements are full.    Motor:  Muscle bulk is normal.   Tone is normal. Strength is  5 / 5 in all 4 extremities including right abductor pollicis brevis.  She has a slight Tinel's sign on the right.  No Phalen signs.  Sensory: Sensory testing is intact to pinprick, soft touch and vibration sensation in the arms now  Gait and station: Station is normal.   Gait is normal.  Tandem is normal.  Romberg is negative.  Reflexes: Deep tendon reflexes are  symmetric and normal in the arms     ASSESSMENT AND PLAN  1. OSA (obstructive sleep apnea)   2. Other headache syndrome   3. Neck pain   4. Migraine without aura and without status migrainosus, not  intractable   5. Bilateral carpal tunnel syndrome       1.  HA's are doing well and she will remain off of prophylactic medication.Marland Kitchen  She will call if they recur.   .   2.   If  HA frequency worsens, consider repeat injection and/or starting zonisamide for migraine prophylaxis.  Also consider meloxicam as may also helpTMJ 3.   For OSA, try weight loss.   If unable to lose weight consider APAP (will need to get HST report).    4.   Rtc 6 months or call us back if HA worsens  She is advised to let us know if she has any new or worsening neurologic symptoms.   Leone Mobley A. Epimenio Foot, MD, PhD, [place] 04/20/2023, 8:17 PM Certified in Neurology, Clinical Neurophysiology, Sleep Medicine and Neuroimaging  [facility] [address] [place], [place] [address] [phone]

## 2023-04-22 ENCOUNTER — Other Ambulatory Visit: Payer: Self-pay | Admitting: Nurse Practitioner

## 2023-05-24 ENCOUNTER — Other Ambulatory Visit: Payer: Self-pay | Admitting: Family Medicine

## 2023-06-01 ENCOUNTER — Ambulatory Visit: Payer: 59 | Admitting: Family Medicine

## 2023-06-02 ENCOUNTER — Ambulatory Visit: Payer: 59 | Admitting: Family Medicine

## 2023-06-02 VITALS — BP 123/81 | HR 76 | Temp 98.6°F | Ht 62.0 in | Wt 187.8 lb

## 2023-06-02 DIAGNOSIS — Z79899 Other long term (current) drug therapy: Secondary | ICD-10-CM | POA: Diagnosis not present

## 2023-06-02 DIAGNOSIS — Z794 Long term (current) use of insulin: Secondary | ICD-10-CM

## 2023-06-02 DIAGNOSIS — E1169 Type 2 diabetes mellitus with other specified complication: Secondary | ICD-10-CM | POA: Diagnosis not present

## 2023-06-02 DIAGNOSIS — I10 Essential (primary) hypertension: Secondary | ICD-10-CM | POA: Diagnosis not present

## 2023-06-02 DIAGNOSIS — J45991 Cough variant asthma: Secondary | ICD-10-CM | POA: Diagnosis not present

## 2023-06-02 DIAGNOSIS — E785 Hyperlipidemia, unspecified: Secondary | ICD-10-CM

## 2023-06-02 MED ORDER — CITALOPRAM HYDROBROMIDE 20 MG PO TABS: 20.0000 mg | ORAL_TABLET | Freq: Every day | ORAL | 1 refills | Status: DC

## 2023-06-02 MED ORDER — OZEMPIC (1 MG/DOSE) 4 MG/3ML ~~LOC~~ SOPN: 1.0000 mg | PEN_INJECTOR | SUBCUTANEOUS | 4 refills | Status: DC

## 2023-06-02 MED ORDER — LANTUS SOLOSTAR 100 UNIT/ML ~~LOC~~ SOPN
PEN_INJECTOR | SUBCUTANEOUS | 3 refills | Status: DC
Start: 2023-06-02 — End: 2024-01-23

## 2023-06-02 NOTE — Progress Notes (Signed)
Subjective:    Patient ID: Emily Braun, female    DOB: 12-Apr-1976, 47 y.o.   MRN: [id number]  HPI The patient was seen today as part of a comprehensive diabetic check up. Patient has diabetes Patient relates good compliance with taking the medication. We discussed their diet and exercise activities  We also discussed the importance of notifying us if any excessively high glucoses or low sugars.    Patient here for follow-up regarding cholesterol.    Patient relates taking medication on a regular basis Denies problems with medication Importance of dietary measures discussed Regular lab work regarding lipid and liver was checked and if needing additional labs was appropriately ordered  Patient for blood pressure check up.  The patient does have hypertension.   Patient relates dietary measures try to minimize salt The importance of healthy diet and activity were discussed Patient relates compliance  The patient's BMI is calculated.  The patient does have obesity.  The patient does try to some degree staying active and watching diet.  It is in the vital signs and acknowledged.  It is above the recommended BMI for the patient's height and weight.  The patient has been counseled regarding healthy diet, restricted portions, avoiding excessive carbohydrates/sugary foods, and increase physical activity as health permits.  It is in the patient's best interest to lower the risk of secondary illness including heart disease strokes and cancer by losing weight.  The patient acknowledges this information.  Patient currently on 20 units of insulin Did not tolerate Mounjaro severe abdominal pain and vomiting Switched over to Ozempic tolerating it okay currently Seeing specialist for management migraines Reactive airway stable not having to use Symbicort recently    Review of Systems     Objective:   Physical Exam General-in no acute distress Eyes-no discharge Lungs-respiratory rate  normal, CTA CV-no murmurs,RRR Extremities skin warm dry no edema Neuro grossly normal Behavior normal, alert Diabetic foot exam normal       Assessment & Plan:   1. Essential hypertension, benign HTN- patient seen for follow-up regarding HTN.   Diet, medication compliance, appropriate labs and refills were completed.   Importance of keeping blood pressure under good control to lessen the risk of complications discussed Regular follow-up visits discussed   2. Type 2 diabetes mellitus without complication, with long-term current use of insulin (HCC) The patient was seen today as part of a comprehensive visit for diabetes. The importance of keeping her A1c at or below 7 range was discussed.  Discussed diet, activity, and medication compliance Emphasized healthy eating primarily with vegetables fruits and if utilizing meats lean meats such as chicken or fish grilled baked broiled Avoid sugary drinks Minimize and avoid processed foods Fit in regular physical activity preferably 25 to 30 minutes 4 times per week Standard follow-up visit recommended.  Patient aware lack of control and follow-up increases risk of diabetic complications. Regular follow-up visits Yearly ophthalmology Yearly foot exam  Currently on 20 units No low sugar Will check labs Continue current measures Ozempic will move to 1.0 Patient will give [place] feedback on how that is going   3. Hyperlipidemia associated with type 2 diabetes mellitus (HCC) Hyperlipidemia-importance of diet, weight control, activity, compliance with medications discussed.   Recent labs reviewed.   Any additional labs or refills ordered.   Importance of keeping under good control discussed. Regular follow-up visits discussed   4. Cough variant asthma Stable currently  She will follow-up in 6 months

## 2023-06-23 ENCOUNTER — Encounter: Payer: 59 | Admitting: Nurse Practitioner

## 2023-07-02 ENCOUNTER — Other Ambulatory Visit: Payer: Self-pay | Admitting: Nurse Practitioner

## 2023-07-19 ENCOUNTER — Encounter: Payer: 59 | Admitting: Nurse Practitioner

## 2023-08-03 ENCOUNTER — Encounter: Payer: 59 | Admitting: Nurse Practitioner

## 2023-08-17 ENCOUNTER — Other Ambulatory Visit: Payer: Self-pay | Admitting: Family Medicine

## 2023-09-18 ENCOUNTER — Encounter: Payer: Self-pay | Admitting: Nurse Practitioner

## 2023-09-18 ENCOUNTER — Ambulatory Visit (INDEPENDENT_AMBULATORY_CARE_PROVIDER_SITE_OTHER): Payer: 59 | Admitting: Nurse Practitioner

## 2023-09-18 VITALS — BP 110/70 | HR 88 | Temp 97.3°F | Ht 62.0 in | Wt 185.0 lb

## 2023-09-18 DIAGNOSIS — Z1211 Encounter for screening for malignant neoplasm of colon: Secondary | ICD-10-CM

## 2023-09-18 DIAGNOSIS — Z0001 Encounter for general adult medical examination with abnormal findings: Secondary | ICD-10-CM | POA: Diagnosis not present

## 2023-09-18 DIAGNOSIS — E1169 Type 2 diabetes mellitus with other specified complication: Secondary | ICD-10-CM

## 2023-09-18 DIAGNOSIS — I10 Essential (primary) hypertension: Secondary | ICD-10-CM

## 2023-09-18 DIAGNOSIS — R5383 Other fatigue: Secondary | ICD-10-CM

## 2023-09-18 DIAGNOSIS — Z1159 Encounter for screening for other viral diseases: Secondary | ICD-10-CM

## 2023-09-18 DIAGNOSIS — Z1231 Encounter for screening mammogram for malignant neoplasm of breast: Secondary | ICD-10-CM

## 2023-09-18 DIAGNOSIS — E785 Hyperlipidemia, unspecified: Secondary | ICD-10-CM

## 2023-09-18 DIAGNOSIS — E119 Type 2 diabetes mellitus without complications: Secondary | ICD-10-CM

## 2023-09-18 DIAGNOSIS — Z114 Encounter for screening for human immunodeficiency virus [HIV]: Secondary | ICD-10-CM

## 2023-09-18 DIAGNOSIS — Z0000 Encounter for general adult medical examination without abnormal findings: Secondary | ICD-10-CM

## 2023-09-18 DIAGNOSIS — Z794 Long term (current) use of insulin: Secondary | ICD-10-CM

## 2023-09-18 MED ORDER — LO LOESTRIN FE 1 MG-10 MCG / 10 MCG PO TABS: 1.0000 | ORAL_TABLET | Freq: Every day | ORAL | 3 refills | Status: DC

## 2023-09-18 NOTE — Progress Notes (Signed)
Subjective:    Patient ID: Emily Braun, female    DOB: [birth year], 48 y.o.   MRN: [id number]  HPI The patient comes in today for a wellness visit.    A review of their health history was completed.  A review of medications was also completed.  Any needed refills; oc's  Eating habits: Fair; eating on the run; also eating leftovers from her children  Regular exercise: Limited other than working with her 2 children  Specialist pt sees on regular basis: Insurance underwriter, Fish farm manager health issues were discussed.   Additional concerns: None No current sexual partner; no new partners since last PE; no cycles on Lo Loestrin Had last eye exam January 2024; earliest appointment is June 2025 which is scheduled Needs dental exam but does not have dental insurance; also had problems with migraines after her last dental procedures No family history of colon cancer or familial polyposis   Review of Systems  Constitutional:  Positive for fatigue. Negative for activity change and appetite change.  HENT:  Negative for sore throat and trouble swallowing.   Respiratory:  Negative for cough, chest tightness, shortness of breath and wheezing.   Cardiovascular:  Negative for chest pain.  Gastrointestinal:  Positive for constipation and diarrhea. Negative for abdominal distention, abdominal pain, blood in stool, nausea and vomiting.       Occasional alternating cycles of diarrhea and constipation  Genitourinary:  Positive for enuresis. Negative for difficulty urinating, dysuria, genital sores, menstrual problem, pelvic pain and vaginal discharge.       Followed by urology for urinary incontinence. Denies any rash, lesions or vulvar irritation.      09/18/2023    8:46 AM  Depression screen PHQ 2/9  Decreased Interest 0  Down, Depressed, Hopeless 0  PHQ - 2 Score 0  Altered sleeping 0  Tired, decreased energy 0  Change in appetite 0  Feeling bad or failure about yourself  0   Trouble concentrating 0  Moving slowly or fidgety/restless 0  Suicidal thoughts 0  PHQ-9 Score 0  Difficult doing work/chores Not difficult at all      09/18/2023    8:47 AM 09/18/2023    8:44 AM 06/02/2023   10:32 AM 11/30/2022    9:12 AM  GAD 7 : Generalized Anxiety Score  Nervous, Anxious, on Edge 1 0 0 1  Control/stop worrying 1 0 1 1  Worry too much - different things 1 0 1 1  Trouble relaxing 1 0 1 1  Restless 1 0 0 0  Easily annoyed or irritable 1 0 0 0  Afraid - awful might happen 1 0 1 1  Total GAD 7 Score 7 0 4 5  Anxiety Difficulty Not difficult at all Not difficult at all Not difficult at all Not difficult at all   Social History   Tobacco Use   Smoking status: Never   Smokeless tobacco: Never  Vaping Use   Vaping status: Never Used  Substance Use Topics   Alcohol use: Not Currently    Comment: socially   Drug use: Never         Objective:   Physical Exam Vitals and nursing note reviewed.  Constitutional:      General: She is not in acute distress.    Appearance: She is well-developed.  Neck:     Thyroid: No thyromegaly.     Trachea: No tracheal deviation.     Comments: Thyroid non tender to palpation. No mass or  goiter noted.  Cardiovascular:     Rate and Rhythm: Normal rate and regular rhythm.     Heart sounds: Normal heart sounds. No murmur heard. Pulmonary:     Effort: Pulmonary effort is normal.     Breath sounds: Normal breath sounds.  Chest:  Breasts:    Right: No swelling, inverted nipple, mass, skin change or tenderness.     Left: No swelling, inverted nipple, mass, skin change or tenderness.  Abdominal:     General: There is no distension.     Palpations: Abdomen is soft.     Tenderness: There is no abdominal tenderness.  Genitourinary:    Comments: Defers GU and pelvic exams; denies any problems.  Musculoskeletal:     Cervical back: Normal range of motion and neck supple.  Lymphadenopathy:     Cervical: No cervical adenopathy.      Upper Body:     Right upper body: No supraclavicular, axillary or pectoral adenopathy.     Left upper body: No supraclavicular, axillary or pectoral adenopathy.  Skin:    General: Skin is warm and dry.  Neurological:     Mental Status: She is alert and oriented to person, place, and time.  Psychiatric:        Mood and Affect: Mood normal.        Behavior: Behavior normal.        Thought Content: Thought content normal.        Judgment: Judgment normal.    Today's Vitals   09/18/23 0842  BP: 110/70  Pulse: 88  Temp: (!) 97.3 F (36.3 C)  SpO2: 98%  Weight: 185 lb (83.9 kg)  Height: 5\' 2"  (1.575 m)   Body mass index is 33.84 kg/m.         Assessment & Plan:   Problem List Items Addressed This Visit       Cardiovascular and Mediastinum   Essential hypertension, benign   Relevant Orders   Comprehensive metabolic panel   Microalbumin/Creatinine Ratio, Urine     Endocrine   DM (diabetes mellitus), type 2 (HCC)   Relevant Orders   Comprehensive metabolic panel   Hemoglobin A1c   Lipid panel   Microalbumin/Creatinine Ratio, Urine   Hyperlipidemia associated with type 2 diabetes mellitus (HCC)   Relevant Orders   Lipid panel   Other Visit Diagnoses       Well woman exam (no gynecological exam)    -  Primary   Relevant Orders   MM DIGITAL SCREENING BILATERAL     Fatigue, unspecified type       Relevant Orders   CBC with Differential/Platelet   Comprehensive metabolic panel   TSH     Screening for HIV (human immunodeficiency virus)       Relevant Orders   HIV Antibody (routine testing w rflx)     Encounter for hepatitis C screening test for low risk patient       Relevant Orders   Hepatitis C antibody     Screen for colon cancer       Relevant Orders   Cologuard     Encounter for screening mammogram for malignant neoplasm of breast       Relevant Orders   MM DIGITAL SCREENING BILATERAL      Meds ordered this encounter  Medications    Norethindrone-Ethinyl Estradiol-Fe Biphas (LO LOESTRIN FE) 1 MG-10 MCG / 10 MCG tablet    Sig: Take 1 tablet by mouth daily.    Dispense:  84 tablet    Refill:  3    Supervising Provider:   Lilyan Punt A [[id number]]   Discussed potential risks associated with estrogen use including DVT/PE and cancer. Labs ordered. Discussed colon cancer screening. Based on history, she is average risk. Agrees to do Cologuard screening.  Mammogram ordered.  Recommend eye exam in June as planned.  Encouraged regular activity and healthy diet.  Return in about 1 year (around 09/17/2024) for physical. Follow up on 12/01/23 as planned.

## 2023-09-18 NOTE — Patient Instructions (Signed)
Follow up with Dr. Lorin Picket on 12/01/23

## 2023-09-19 LAB — CBC WITH DIFFERENTIAL/PLATELET
Basophils Absolute: 0.1 10*3/uL (ref 0.0–0.2)
Basos: 1 %
EOS (ABSOLUTE): 0.2 10*3/uL (ref 0.0–0.4)
Eos: 3 %
Hematocrit: 42.7 % (ref 34.0–46.6)
Hemoglobin: 14.4 g/dL (ref 11.1–15.9)
Immature Grans (Abs): 0 10*3/uL (ref 0.0–0.1)
Immature Granulocytes: 0 %
Lymphocytes Absolute: 1.4 10*3/uL (ref 0.7–3.1)
Lymphs: 20 %
MCH: 28.9 pg (ref 26.6–33.0)
MCHC: 33.7 g/dL (ref 31.5–35.7)
MCV: 86 fL (ref 79–97)
Monocytes Absolute: 0.4 10*3/uL (ref 0.1–0.9)
Monocytes: 5 %
Neutrophils Absolute: 5 10*3/uL (ref 1.4–7.0)
Neutrophils: 71 %
Platelets: 324 10*3/uL (ref 150–450)
RBC: 4.98 x10E6/uL (ref 3.77–5.28)
RDW: 12 % (ref 11.7–15.4)
WBC: 7.1 10*3/uL (ref 3.4–10.8)

## 2023-09-19 LAB — COMPREHENSIVE METABOLIC PANEL
ALT: 16 [IU]/L (ref 0–32)
AST: 21 [IU]/L (ref 0–40)
Albumin: 4.5 g/dL (ref 3.9–4.9)
Alkaline Phosphatase: 65 [IU]/L (ref 44–121)
BUN/Creatinine Ratio: 14 (ref 9–23)
BUN: 11 mg/dL (ref 6–24)
Bilirubin Total: 0.4 mg/dL (ref 0.0–1.2)
CO2: 25 mmol/L (ref 20–29)
Calcium: 9.5 mg/dL (ref 8.7–10.2)
Chloride: 99 mmol/L (ref 96–106)
Creatinine, Ser: 0.8 mg/dL (ref 0.57–1.00)
Globulin, Total: 2.5 g/dL (ref 1.5–4.5)
Glucose: 115 mg/dL — ABNORMAL HIGH (ref 70–99)
Potassium: 4.2 mmol/L (ref 3.5–5.2)
Sodium: 141 mmol/L (ref 134–144)
Total Protein: 7 g/dL (ref 6.0–8.5)
eGFR: 92 mL/min/{1.73_m2} (ref >59)

## 2023-09-19 LAB — MICROALBUMIN / CREATININE URINE RATIO
Creatinine, Urine: 182.5 mg/dL
Microalb/Creat Ratio: 6 mg/g{creat} (ref 0–29)
Microalbumin, Urine: 10.9 ug/mL

## 2023-09-19 LAB — LIPID PANEL
Chol/HDL Ratio: 2 {ratio} (ref 0.0–4.4)
Cholesterol, Total: 116 mg/dL (ref 100–199)
HDL: 58 mg/dL (ref >39)
LDL Chol Calc (NIH): 46 mg/dL (ref 0–99)
Triglycerides: 53 mg/dL (ref 0–149)
VLDL Cholesterol Cal: 12 mg/dL (ref 5–40)

## 2023-09-19 LAB — HEPATITIS C ANTIBODY: Hep C Virus Ab: NONREACTIVE

## 2023-09-19 LAB — HIV ANTIBODY (ROUTINE TESTING W REFLEX): HIV Screen 4th Generation wRfx: NONREACTIVE

## 2023-09-19 LAB — HEMOGLOBIN A1C
Est. average glucose Bld gHb Est-mCnc: 134 mg/dL
Hgb A1c MFr Bld: 6.3 % — ABNORMAL HIGH (ref 4.8–5.6)

## 2023-09-19 LAB — TSH: TSH: 0.712 u[IU]/mL (ref 0.450–4.500)

## 2023-09-22 ENCOUNTER — Ambulatory Visit
Admission: RE | Admit: 2023-09-22 | Discharge: 2023-09-22 | Disposition: A | Discharge status: Home / Self Care | Payer: 59 | Source: Ambulatory Visit | Attending: Family Medicine | Admitting: Family Medicine

## 2023-09-22 ENCOUNTER — Encounter: Payer: Self-pay | Admitting: Nurse Practitioner

## 2023-09-22 ENCOUNTER — Ambulatory Visit (HOSPITAL_COMMUNITY)
Admission: RE | Admit: 2023-09-22 | Discharge: 2023-09-22 | Disposition: A | Discharge status: Home / Self Care | Payer: 59 | Source: Ambulatory Visit | Attending: Nurse Practitioner | Admitting: Nurse Practitioner

## 2023-09-22 VITALS — BP 145/88 | HR 69 | Temp 98.0°F | Resp 18

## 2023-09-22 DIAGNOSIS — Z1231 Encounter for screening mammogram for malignant neoplasm of breast: Secondary | ICD-10-CM | POA: Insufficient documentation

## 2023-09-22 DIAGNOSIS — J069 Acute upper respiratory infection, unspecified: Secondary | ICD-10-CM

## 2023-09-22 DIAGNOSIS — Z0000 Encounter for general adult medical examination without abnormal findings: Secondary | ICD-10-CM | POA: Diagnosis present

## 2023-09-22 DIAGNOSIS — H66002 Acute suppurative otitis media without spontaneous rupture of ear drum, left ear: Secondary | ICD-10-CM

## 2023-09-22 MED ORDER — AMOXICILLIN 875 MG PO TABS: 875.0000 mg | ORAL_TABLET | Freq: Two times a day (BID) | ORAL | 0 refills | Status: DC

## 2023-09-22 NOTE — ED Triage Notes (Signed)
Pt reports throat pain, headache,nasal congestion, and bilateral ear pain and drainage x 1 day.

## 2023-09-22 NOTE — ED Provider Notes (Signed)
[facility]    CSN: [id number] Arrival date & time: 09/22/23  1450      History   Chief Complaint Chief Complaint  Patient presents with   Sore Throat    Ear pain - Entered by patient    HPI Emily Braun is a 48 y.o. female.   Pt reports throat pain, headache,nasal congestion, and bilateral ear pain and drainage x 1 day.      Past Medical History:  Diagnosis Date   Adhesive capsulitis of left shoulder 06/02/2021   Anxiety    Arthritis    Cancer (HCC)    skin cancer removed from back   Carpal tunnel syndrome, left    Depression    Family history of adverse reaction to anesthesia    brother fights when wakes up   Fracture of humerus, proximal, left, closed 06/21/2020   GERD (gastroesophageal reflux disease)    Headache    History of hiatal hernia    Hypertension    followed by pcp   (06-25-2020 pt stated never had stress test)   IBS (irritable bowel syndrome)    Mixed stress and urge urinary incontinence    urologist--- dr Sherron Monday   Pre-diabetes    Seasonal allergies    Sleep apnea 06/02/2021   no cpap used osa per sleep study   Wears glasses     Patient Active Problem List   Diagnosis Date Noted   Nausea and vomiting 01/20/2023   Medication side effect 01/20/2023   Cough variant asthma 10/22/2022   Abnormal uterine bleeding 07/31/2022   Chronic cough 06/07/2022   OSA (obstructive sleep apnea) 04/05/2022   Migraine without aura and without status migrainosus, not intractable 04/05/2022   TMJ (temporomandibular joint syndrome) 01/07/2022   Acute nonintractable headache 01/04/2022   Bilateral carpal tunnel syndrome 03/22/2019   Hyperlipidemia associated with type 2 diabetes mellitus (HCC) 03/16/2018   DM (diabetes mellitus), type 2 (HCC) 08/28/2015   GERD (gastroesophageal reflux disease) 08/25/2014   Essential hypertension, benign 07/03/2013    Past Surgical History:  Procedure Laterality Date   CHOLECYSTECTOMY     2007    COLONOSCOPY WITH PROPOFOL     02/22/2013  dr Jarold Motto   ESOPHAGOGASTRODUODENOSCOPY (EGD) WITH PROPOFOL     08-08-2016  dr Myrtie Neither   HARDWARE REMOVAL Left 05/11/2022   Procedure: HARDWARE REMOVAL;  Surgeon: Bjorn Pippin, MD;  Location: [facility];  Service: Orthopedics;  Laterality: Left;   LAPAROSCOPIC CHOLECYSTECTOMY  09-10-2007  @[facility]    laparoscopic   MAXILLARY ANTROSTOMY Left 03/10/2014   Procedure: LEFT MAXILLARY ANTROSTOMY;  Surgeon: Darletta Moll, MD;  Location: [facility];  Service: ENT;  Laterality: Left;   ORIF HUMERUS FRACTURE Left 06/30/2020   Procedure: OPEN REDUCTION INTERNAL FIXATION (ORIF) PROXIMAL HUMERUS FRACTURE;  Surgeon: Sheral Apley, MD;  Location: [facility];  Service: Orthopedics;  Laterality: Left;   ORIF PATELLA FRACTURE Left 2011   REVERSE SHOULDER ARTHROPLASTY Left 05/11/2022   Procedure: REVERSE SHOULDER ARTHROPLASTY;  Surgeon: Bjorn Pippin, MD;  Location: [facility];  Service: Orthopedics;  Laterality: Left;   SHOULDER CLOSED REDUCTION Left 06/08/2021   Procedure: CLOSED MANIPULATION SHOULDER, WITH INJECTION;  Surgeon: Sheral Apley, MD;  Location: [facility];  Service: Orthopedics;  Laterality: Left;   WISDOM TOOTH EXTRACTION     yrs ago per pt on 06-02-2021    OB History   No obstetric history on file.      Home Medications  Prior to Admission medications   Medication Sig Start Date End Date Taking? Authorizing Provider  amoxicillin (AMOXIL) 875 MG tablet Take 1 tablet (875 mg total) by mouth 2 (two) times daily. 09/22/23  Yes Particia Nearing, PA-C  budesonide-formoterol [facility]) 80-4.5 MCG/ACT inhaler Inhale 2 puffs into the lungs 2 (two) times daily. 10/21/22   Campbell Riches, NP  Calcium Carbonate-Vitamin D (CALCIUM + D PO) Take 1 tablet by mouth daily.    [provider]  citalopram (CELEXA) 20 MG tablet Take 1 tablet (20 mg total) by mouth daily. 06/02/23   Babs Sciara, MD   fluticasone (FLONASE) 50 MCG/ACT nasal spray 2 sprays each nare daily 11/30/22   Luking, Jonna Coup, MD  LANTUS SOLOSTAR 100 UNIT/ML Solostar Pen Use 20 units each evening and may titrate up to 55 units depending on glucose readings and doctor direction 06/02/23   Babs Sciara, MD  lisinopril (ZESTRIL) 10 MG tablet Take 1 tablet (10 mg total) by mouth daily. 11/30/22   Babs Sciara, MD  loratadine (CLARITIN) 10 MG tablet Take 10 mg by mouth at bedtime.     [provider]  Norethindrone-Ethinyl Estradiol-Fe Biphas (LO LOESTRIN FE) 1 MG-10 MCG / 10 MCG tablet Take 1 tablet by mouth daily. 09/18/23   Campbell Riches, NP  oxybutynin (DITROPAN) 5 MG tablet Take 5 mg by mouth 2 (two) times daily. 12/20/19   [provider]  pantoprazole (PROTONIX) 40 MG tablet TAKE 1 TABLET BY MOUTH ONCE DAILY FOR  ACID  REFLUX 08/18/23   Babs Sciara, MD  pravastatin (PRAVACHOL) 20 MG tablet TAKE 1 TABLET BY MOUTH TWICE A WEEK ON MONDAYS AND FRIDAYS 03/21/23   Babs Sciara, MD  rizatriptan (MAXALT-MLT) 10 MG disintegrating tablet Take 1 tablet (10 mg total) by mouth as needed for migraine. May repeat in 2 hours if needed 04/05/22   Sater, Pearletha Furl, MD  Semaglutide, 1 MG/DOSE, (OZEMPIC, 1 MG/DOSE,) 4 MG/3ML SOPN Inject 1 mg into the skin once a week. 06/02/23   Babs Sciara, MD  triamcinolone cream (KENALOG) 0.1 % Apply 1 Application topically 2 (two) times daily. 11/30/22   Babs Sciara, MD    Family History Family History  Problem Relation Age of Onset   Diabetes Father    Hypertension Father    Diabetes Maternal Grandmother    Diabetes Paternal Grandmother    Hypertension Mother    Cancer - Other Brother        testicular   Esophageal cancer Neg Hx     Social History Social History   Tobacco Use   Smoking status: Never   Smokeless tobacco: Never  Vaping Use   Vaping status: Never Used  Substance Use Topics   Alcohol use: Not Currently    Comment: socially   Drug use: Never      Allergies   Bydureon [exenatide], Metformin and related, Mounjaro [tirzepatide], and Tradjenta [linagliptin]   Review of Systems Review of Systems PER HPI  Physical Exam Triage Vital Signs ED Triage Vitals  Encounter Vitals Group     BP 09/22/23 1458 (!) 145/88     Systolic BP Percentile --      Diastolic BP Percentile --      Pulse Rate 09/22/23 1458 69     Resp 09/22/23 1458 18     Temp 09/22/23 1458 98 F (36.7 C)     Temp Source 09/22/23 1458 Oral     SpO2 09/22/23  1458 97 %     Weight --      Height --      Head Circumference --      Peak Flow --      Pain Score 09/22/23 1457 3     Pain Loc --      Pain Education --      Exclude from Growth Chart --    No data found.  Updated Vital Signs BP (!) 145/88 (BP Location: Right Arm)   Pulse 69   Temp 98 F (36.7 C) (Oral)   Resp 18   SpO2 97%   Visual Acuity Right Eye Distance:   Left Eye Distance:   Bilateral Distance:    Right Eye Near:   Left Eye Near:    Bilateral Near:     Physical Exam Vitals and nursing note reviewed.  Constitutional:      Appearance: Normal appearance.  HENT:     Head: Atraumatic.     Right Ear: Tympanic membrane and external ear normal.     Left Ear: External ear normal.     Ears:     Comments: Left TM bulging and erythematous    Nose: Congestion present.     Mouth/Throat:     Mouth: Mucous membranes are moist.     Pharynx: Posterior oropharyngeal erythema present.  Eyes:     Extraocular Movements: Extraocular movements intact.     Conjunctiva/sclera: Conjunctivae normal.  Cardiovascular:     Rate and Rhythm: Normal rate and regular rhythm.     Heart sounds: Normal heart sounds.  Pulmonary:     Effort: Pulmonary effort is normal.     Breath sounds: Normal breath sounds. No wheezing.  Musculoskeletal:        General: Normal range of motion.     Cervical back: Normal range of motion and neck supple.  Skin:    General: Skin is warm and dry.  Neurological:      Mental Status: She is alert and oriented to person, place, and time.  Psychiatric:        Mood and Affect: Mood normal.        Thought Content: Thought content normal.      UC Treatments / Results  Labs (all labs ordered are listed, but only abnormal results are displayed) Labs Reviewed - No data to display  EKG   Radiology No results found.  Procedures Procedures (including critical care time)  Medications Ordered in UC Medications - No data to display  Initial Impression / Assessment and Plan / UC Course  I have reviewed the triage vital signs and the nursing notes.  Pertinent labs & imaging results that were available during my care of the patient were reviewed by me and considered in my medical decision making (see chart for details).     Consistent with viral respiratory infection causing a left ear infection.  Treat with amoxicillin, Flonase, decongestants, supportive home care.  Return for worsening symptoms.  Final Clinical Impressions(s) / UC Diagnoses   Final diagnoses:  Viral URI with cough  Acute suppurative otitis media of left ear without spontaneous rupture of tympanic membrane, recurrence not specified   Discharge Instructions   None    ED Prescriptions     Medication Sig Dispense Auth. Provider   amoxicillin (AMOXIL) 875 MG tablet Take 1 tablet (875 mg total) by mouth 2 (two) times daily. 20 tablet Particia Nearing, [place]      PDMP not reviewed this encounter.  Particia Nearing, [place] 09/22/23 1536

## 2023-10-02 ENCOUNTER — Other Ambulatory Visit: Payer: Self-pay | Admitting: Medical Genetics

## 2023-10-25 LAB — COLOGUARD: COLOGUARD: NEGATIVE

## 2023-11-07 ENCOUNTER — Ambulatory Visit (INDEPENDENT_AMBULATORY_CARE_PROVIDER_SITE_OTHER): Payer: 59 | Admitting: Neurology

## 2023-11-07 ENCOUNTER — Encounter: Payer: Self-pay | Admitting: Neurology

## 2023-11-07 VITALS — BP 149/86 | HR 77 | Ht 62.0 in | Wt 188.5 lb

## 2023-11-07 DIAGNOSIS — I10 Essential (primary) hypertension: Secondary | ICD-10-CM

## 2023-11-07 DIAGNOSIS — G43009 Migraine without aura, not intractable, without status migrainosus: Secondary | ICD-10-CM | POA: Diagnosis not present

## 2023-11-07 DIAGNOSIS — M542 Cervicalgia: Secondary | ICD-10-CM | POA: Diagnosis not present

## 2023-11-07 DIAGNOSIS — G4733 Obstructive sleep apnea (adult) (pediatric): Secondary | ICD-10-CM | POA: Diagnosis not present

## 2023-11-07 DIAGNOSIS — G44229 Chronic tension-type headache, not intractable: Secondary | ICD-10-CM | POA: Diagnosis not present

## 2023-11-07 MED ORDER — METHYLPREDNISOLONE ACETATE 80 MG/ML IJ SUSP
80.0000 mg | Freq: Once | INTRAMUSCULAR | Status: AC
Start: 2023-11-07 — End: 2023-11-07
  Administered 2023-11-07: 80 mg via INTRAMUSCULAR

## 2023-11-07 MED ORDER — BUPIVACAINE HCL (PF) 0.5 % IJ SOLN: 3.0000 mL | Freq: Once | INTRAMUSCULAR | Status: AC

## 2023-11-07 NOTE — Progress Notes (Signed)
 [facility]  PATIENT: Emily Braun DOB: 08/22/1976  REFERRING DOCTOR OR PCP: Lilyan Punt SOURCE: Patient, notes from primary care, imaging reports.  _________________________________   HISTORICAL  CHIEF COMPLAINT:  Chief Complaint  Patient presents with   Follow-up    Rm11, alone, OSA cpap reluctancy:pt would like to discuss possibly starting.  Ha: 5-30 days Migraine:0/30 days, triggers:stress.  Neck pain: currently experiencing it today middle back back of neck that radiates down mid back. Bilateral carpal tunnel syndrome: had surgery on right hand to fix and gets injections in left hand.      HISTORY OF PRESENT ILLNESS:   Emily Braun is a 48 year old woman with headaches and sleep apnea.  Update 11/07/2023 She was diagnosed with OSA 04/2021.  She used a mouth guard for a while but HA was worse and she stopped  She was initially reluctant to use CPAP as she has a young child and she wants to make sure she can hear at night.  However, since she was unable to tolerate the mouthguard she would reconsider CPAP.  Study was done through [facility] [place] (Dr. Michaell Cowing (DDS))   We were not able to get the results.      She has had more sleepiness..   She gained some weight and was on Mounjaro initially but felt sick so switched to Ozempic.  She is on the low dose and has not lost weight  HA's improved after the TPI/ONB in 09/2022.   She has only had a couple minor HA a month until afew weeks ago when HA intensified again.  She has not had severe migraines with N/V, photophobia and phonophobia.     She denies any more diplopia.   She was told she had  some EOM misalignment.   She had an MRI of the brain 03/07/2022 (normal, I concur).    I previousl saw her for CTS.  NCV showed moderate right and mild left CTS.   She had right CTR     Impression: This NCV/EMG study shows neurophysiologic evidence of: 1.   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 2.   Mild median neuropathy across the left wrist 3.   Probable mild left C8 and possible minimal left C5 or C6 chronic radiculopathies   REVIEW OF SYSTEMS: Constitutional: No fevers, chills, sweats, or change in appetite Eyes: No visual changes, double vision, eye pain Ear, nose and throat: No hearing loss, ear pain, nasal congestion, sore throat Cardiovascular: No chest pain, palpitations Respiratory:  No shortness of breath at rest or with exertion.   No wheezes GastrointestinaI: No nausea, vomiting, diarrhea.   she has GERD.   Genitourinary:  No dysuria.  She has urinary frequency. Musculoskeletal: She reports neck pain.  No back pain. Integumentary: No rash, pruritus, skin lesions Neurological: as above Psychiatric: No depression at this time.  No anxiety Endocrine: No palpitations, diaphoresis, change in appetite, change in weigh or increased thirst Hematologic/Lymphatic:  No anemia, purpura, petechiae. Allergic/Immunologic: No itchy/runny eyes, nasal congestion, recent allergic reactions, rashes  ALLERGIES: Allergies  Allergen Reactions   Bydureon [Exenatide] Itching    Caused knots in her skin as well as itching at the site of injection   Metformin And Related Diarrhea   Mounjaro [Tirzepatide]     Abdominal cramps nausea vomiting   Tradjenta [Linagliptin]     Patient relates abdominal cramps and diarrhea    HOME MEDICATIONS:  Current Outpatient Medications:  budesonide-formoterol (SYMBICORT) 80-4.5 MCG/ACT inhaler, Inhale 2 puffs into the lungs 2 (two) times daily., Disp: 1 each, Rfl: 5   Calcium Carbonate-Vitamin D (CALCIUM + D PO), Take 1 tablet by mouth daily., Disp: , Rfl:    citalopram (CELEXA) 20 MG tablet, Take 1 tablet (20 mg total) by mouth daily., Disp: 90 tablet, Rfl: 1   fluticasone (FLONASE) 50 MCG/ACT nasal spray, 2 sprays each nare daily, Disp: 48 g, Rfl: 3   LANTUS SOLOSTAR 100 UNIT/ML Solostar Pen, Use 20 units each evening and may  titrate up to 55 units depending on glucose readings and doctor direction, Disp: 15 mL, Rfl: 3   lisinopril (ZESTRIL) 10 MG tablet, Take 1 tablet (10 mg total) by mouth daily., Disp: 90 tablet, Rfl: 3   loratadine (CLARITIN) 10 MG tablet, Take 10 mg by mouth at bedtime. , Disp: , Rfl:    Norethindrone-Ethinyl Estradiol-Fe Biphas (LO LOESTRIN FE) 1 MG-10 MCG / 10 MCG tablet, Take 1 tablet by mouth daily., Disp: 84 tablet, Rfl: 3   oxybutynin (DITROPAN) 5 MG tablet, Take 5 mg by mouth 2 (two) times daily., Disp: , Rfl:    pantoprazole (PROTONIX) 40 MG tablet, TAKE 1 TABLET BY MOUTH ONCE DAILY FOR  ACID  REFLUX, Disp: 90 tablet, Rfl: 0   pravastatin (PRAVACHOL) 20 MG tablet, TAKE 1 TABLET BY MOUTH TWICE A WEEK ON MONDAYS AND FRIDAYS, Disp: 24 tablet, Rfl: 1   rizatriptan (MAXALT-MLT) 10 MG disintegrating tablet, Take 1 tablet (10 mg total) by mouth as needed for migraine. May repeat in 2 hours if needed, Disp: 9 tablet, Rfl: 11   Semaglutide, 1 MG/DOSE, (OZEMPIC, 1 MG/DOSE,) 4 MG/3ML SOPN, Inject 1 mg into the skin once a week., Disp: 3 mL, Rfl: 4   triamcinolone cream (KENALOG) 0.1 %, Apply 1 Application topically 2 (two) times daily., Disp: 30 g, Rfl: 1  Current Facility-Administered Medications:    bupivacaine(PF) (MARCAINE) 0.5 % injection 3 mL, 3 mL, Other, Once, Donta Fuster, Pearletha Furl, MD  PAST MEDICAL HISTORY: Past Medical History:  Diagnosis Date   Adhesive capsulitis of left shoulder 06/02/2021   Anxiety    Arthritis    Cancer (HCC)    skin cancer removed from back   Carpal tunnel syndrome, left    Depression    Family history of adverse reaction to anesthesia    brother fights when wakes up   Fracture of humerus, proximal, left, closed 06/21/2020   GERD (gastroesophageal reflux disease)    Headache    History of hiatal hernia    Hypertension    followed by pcp   (06-25-2020 pt stated never had stress test)   IBS (irritable bowel syndrome)    Mixed stress and urge urinary  incontinence    urologist--- dr Sherron Monday   Pre-diabetes    Seasonal allergies    Sleep apnea 06/02/2021   no cpap used osa per sleep study   Wears glasses     PAST SURGICAL HISTORY: Past Surgical History:  Procedure Laterality Date   CHOLECYSTECTOMY     2007   COLONOSCOPY WITH PROPOFOL     02/22/2013  dr Jarold Motto   ESOPHAGOGASTRODUODENOSCOPY (EGD) WITH PROPOFOL     08-08-2016  dr Myrtie Neither   HARDWARE REMOVAL Left 05/11/2022   Procedure: HARDWARE REMOVAL;  Surgeon: Bjorn Pippin, MD;  Location: [facility];  Service: Orthopedics;  Laterality: Left;   LAPAROSCOPIC CHOLECYSTECTOMY  09-10-2007  @[facility]    laparoscopic   MAXILLARY ANTROSTOMY Left 03/10/2014   Procedure:  LEFT MAXILLARY ANTROSTOMY;  Surgeon: Darletta Moll, MD;  Location: [facility];  Service: ENT;  Laterality: Left;   ORIF HUMERUS FRACTURE Left 06/30/2020   Procedure: OPEN REDUCTION INTERNAL FIXATION (ORIF) PROXIMAL HUMERUS FRACTURE;  Surgeon: Sheral Apley, MD;  Location: [facility];  Service: Orthopedics;  Laterality: Left;   ORIF PATELLA FRACTURE Left 2011   REVERSE SHOULDER ARTHROPLASTY Left 05/11/2022   Procedure: REVERSE SHOULDER ARTHROPLASTY;  Surgeon: Bjorn Pippin, MD;  Location: [facility];  Service: Orthopedics;  Laterality: Left;   SHOULDER CLOSED REDUCTION Left 06/08/2021   Procedure: CLOSED MANIPULATION SHOULDER, WITH INJECTION;  Surgeon: Sheral Apley, MD;  Location: [facility];  Service: Orthopedics;  Laterality: Left;   WISDOM TOOTH EXTRACTION     yrs ago per pt on 06-02-2021    FAMILY HISTORY: Family History  Problem Relation Age of Onset   Diabetes Father    Hypertension Father    Diabetes Maternal Grandmother    Diabetes Paternal Grandmother    Hypertension Mother    Cancer - Other Brother        testicular   Esophageal cancer Neg Hx     SOCIAL HISTORY:  Social History   Socioeconomic History   Marital status: Single    Spouse name: Not on file    Number of children: 1   Years of education: Not on file   Highest education level: Not on file  Occupational History   Occupation: patient coordinator    Comment: [employer]  Tobacco Use   Smoking status: Never   Smokeless tobacco: Never  Vaping Use   Vaping status: Never Used  Substance and Sexual Activity   Alcohol use: Not Currently    Comment: socially   Drug use: Never   Sexual activity: Not Currently    Birth control/protection: Pill  Other Topics Concern   Not on file  Social History Narrative   Lives with foster-son   Caffeine use: sometimes   Right handed    Social Drivers of Corporate investment banker Strain: Not on file  Food Insecurity: Not on file  Transportation Needs: Not on file  Physical Activity: Not on file  Stress: Not on file  Social Connections: Unknown (12/31/2021)   Received from [facility], [facility]   Social Network    Social Network: Not on file  Intimate Partner Violence: Unknown (12/01/2021)   Received from [facility], [facility]   HITS    Physically Hurt: Not on file    Insult or Talk Down To: Not on file    Threaten Physical Harm: Not on file    Scream or Curse: Not on file     PHYSICAL EXAM  Vitals:   11/07/23 1528  BP: (!) 149/86  Pulse: 77  Weight: 188 lb 8 oz (85.5 kg)  Height: 5\' 2"  (1.575 m)    Body mass index is 34.48 kg/m.   General: The patient is well-developed and well-nourished and in no acute distress.        Neck: The neck has good range of motion and there is bilateral occipital tenderness  Extremities: She has a right carpal tunnel release scar from surgery since her last visit   Neurologic Exam  Mental status: The patient is alert and oriented x 3 at the time of the examination.    Speech is normal.  Cranial nerves: Extraocular movements are full.    Motor:  Muscle bulk is  normal.   Tone is normal. Strength is  5 / 5 in all 4 extremities including right abductor pollicis  brevis.     Sensory: Sensory testing is intact to pinprick, soft touch and vibration sensation in the arms now  Gait and station: Station is normal.  The gait and tandem gait are normal.  Romberg is negative.  Reflexes: Deep tendon reflexes are symmetric and normal in the arms     ASSESSMENT AND PLAN  1. Migraine without aura and without status migrainosus, not intractable   2. OSA (obstructive sleep apnea)   3. Essential hypertension, benign   4. Chronic tension-type headache, not intractable   5. Neck pain      1.  Bilateral splenius capitus TPI with 80 mg DepoMedrol in [place] using sterile technique.   She tolerated the procedure well and there were no complications.  Pain was better afterwards.  2.  Consider TPM or zonisamide if migraine frequency increases. 3.   We discussed CPAP for her OSA.   If unable to get the actual study report will need to redo the HST.    4.   Rtc 6 months or call us back if HA worsens  She is advised to let us know if she has any new or worsening neurologic symptoms.   Analia Zuk A. Epimenio Foot, MD, PhD, [place] 11/07/2023, 10:10 PM Certified in Neurology, Clinical Neurophysiology, Sleep Medicine and Neuroimaging  [facility] [address] [place], [place] [address] [phone]

## 2023-11-19 ENCOUNTER — Other Ambulatory Visit: Payer: Self-pay | Admitting: Family Medicine

## 2023-12-01 ENCOUNTER — Other Ambulatory Visit (HOSPITAL_COMMUNITY)
Admission: RE | Admit: 2023-12-01 | Discharge: 2023-12-01 | Disposition: A | Discharge status: Home / Self Care | Payer: Self-pay | Source: Ambulatory Visit | Attending: Medical Genetics | Admitting: Medical Genetics

## 2023-12-01 ENCOUNTER — Ambulatory Visit: Payer: 59 | Admitting: Family Medicine

## 2023-12-01 ENCOUNTER — Encounter: Payer: Self-pay | Admitting: Family Medicine

## 2023-12-01 VITALS — BP 124/82 | HR 67 | Temp 97.2°F | Ht 62.0 in | Wt 186.0 lb

## 2023-12-01 DIAGNOSIS — E1169 Type 2 diabetes mellitus with other specified complication: Secondary | ICD-10-CM

## 2023-12-01 DIAGNOSIS — E119 Type 2 diabetes mellitus without complications: Secondary | ICD-10-CM

## 2023-12-01 DIAGNOSIS — J45991 Cough variant asthma: Secondary | ICD-10-CM | POA: Diagnosis not present

## 2023-12-01 DIAGNOSIS — I10 Essential (primary) hypertension: Secondary | ICD-10-CM | POA: Diagnosis not present

## 2023-12-01 DIAGNOSIS — E1159 Type 2 diabetes mellitus with other circulatory complications: Secondary | ICD-10-CM | POA: Diagnosis not present

## 2023-12-01 DIAGNOSIS — E785 Hyperlipidemia, unspecified: Secondary | ICD-10-CM

## 2023-12-01 DIAGNOSIS — Z794 Long term (current) use of insulin: Secondary | ICD-10-CM

## 2023-12-01 MED ORDER — FAMOTIDINE 40 MG PO TABS: ORAL_TABLET | ORAL | 1 refills | Status: AC

## 2023-12-01 MED ORDER — OZEMPIC (1 MG/DOSE) 4 MG/3ML ~~LOC~~ SOPN: 1.0000 mg | PEN_INJECTOR | SUBCUTANEOUS | 6 refills | Status: DC

## 2023-12-01 MED ORDER — CITALOPRAM HYDROBROMIDE 20 MG PO TABS: 20.0000 mg | ORAL_TABLET | Freq: Every day | ORAL | 1 refills | Status: DC

## 2023-12-01 MED ORDER — PRAVASTATIN SODIUM 20 MG PO TABS: ORAL_TABLET | ORAL | 1 refills | Status: AC

## 2023-12-01 MED ORDER — PANTOPRAZOLE SODIUM 40 MG PO TBEC: DELAYED_RELEASE_TABLET | ORAL | 1 refills | Status: DC

## 2023-12-01 MED ORDER — TRIAMCINOLONE ACETONIDE 0.1 % EX CREA: 1.0000 | TOPICAL_CREAM | Freq: Two times a day (BID) | CUTANEOUS | 1 refills | Status: AC

## 2023-12-01 NOTE — Progress Notes (Signed)
 Subjective:    Patient ID: Emily Braun, female    DOB: 1975-09-28, 48 y.o.   MRN: [id number]  HPI Htn and diabetes follow up Discussed the use of AI scribe software for clinical note transcription with the patient, who gave verbal consent to proceed.  History of Present Illness   MONSERAT Braun is a 48 year old female who presents for a follow-up visit.  She experiences situational anxiety, which she manages with breathing exercises and occasional walks. Her stress is exacerbated by her children's poor sleep patterns. She does not use any specific apps or read materials for anxiety management.  She has diabetes but does not frequently check her blood sugar levels, only doing so when she feels unwell. She is attempting to improve her diet, acknowledging that she often consumes her children's leftovers, which are higher in carbohydrates. She tries to stay physically active by going for walks.  She experiences reflux, particularly at night, characterized by a burning sensation. She manages it by drinking water, taking Tums, and elevating her pillow. She takes pantoprazole at night, which she finds helpful. The frequency of reflux episodes varies, with some weeks having no episodes and others having two to three.  She has a history of irritable bowel symptoms, with alternating constipation and diarrhea, which she has experienced for a long time. She previously took medication for it but did not find it effective.  She has not had a migraine since her last visit and attributes her headaches to tension or stress. She receives injections in the back of her head once a year, which help alleviate the headaches.  She underwent a sleep apnea test through a dental office but has misplaced the results. She plans to retrieve them from the office in [place].  She is maintaining her general health with regular eye exams and lab work. She recently completed a Cologuard test with negative results,  which is valid for three years.        Review of Systems     Objective:   Physical Exam General-in no acute distress Eyes-no discharge Lungs-respiratory rate normal, CTA CV-no murmurs,RRR Extremities skin warm dry no edema Neuro grossly normal Behavior normal, alert   Labs from recent visit reviewed in detail     Assessment & Plan:  Assessment and Plan    Type 2 Diabetes Mellitus Diabetes well-controlled with A1c of 6.3. Current regimen includes Ozempic and Lantus, both covered by insurance. Maintained Ozempic dose to avoid insurance denial. - Continue current diabetes management plan. - Encourage dietary modifications focusing on lean meats and non-starchy vegetables. - Monitor A1c levels in the fall.  Gastroesophageal Reflux Disease (GERD) Experiences nocturnal burning sensation managed with pantoprazole. Discussed adding famotidine and lifestyle modifications. - Prescribe famotidine for use three times a week as needed. - Advise elevating the head of the bed to reduce nocturnal symptoms.  Irritable Bowel Syndrome (IBS) Alternating constipation and diarrhea consistent with IBS. Medication provides symptom relief.  Anxiety Situational anxiety managed with breathing exercises and walks. Discussed various management techniques.  Migraine Headaches No recent migraines, reports tension headaches. Annual injections effective. - Continue annual injections for headache management.  Asthma No recent asthma issues, possibly improved post shoulder surgery. Flonase effective for allergy management. - Continue using Flonase for allergy management.  Sleep Apnea Sleep apnea test results misplaced. Plans to retrieve and provide results. - Retrieve sleep apnea test results from the dental office and provide to healthcare providers.  General Health Maintenance Up to  date with screenings. Recent lab work showed good kidney function, cholesterol levels, and A1c control. - Ensure  eye exam results are sent to the practice. - Schedule lab work for September or October.  Follow-up - Schedule follow-up appointment for September to early October.      1. Essential hypertension, benign (Primary) Blood pressure good control continue current measures  2. Type 2 diabetes mellitus without complication, with long-term current use of insulin (HCC) Previous A1c has looked good.  Continue Ozempic.  Continue insulin at current levels Patient not having any low sugar spells  3. Hyperlipidemia associated with type 2 diabetes mellitus (HCC) Hyperlipidemia goal is to keep LDL below 70.  Continue medication.  Healthy diet.  4. Cough variant asthma Overall been doing well continue current measures  Lab work before follow-up visit in 5 to 6 months

## 2023-12-04 ENCOUNTER — Other Ambulatory Visit: Payer: Self-pay

## 2023-12-04 DIAGNOSIS — E119 Type 2 diabetes mellitus without complications: Secondary | ICD-10-CM

## 2023-12-04 DIAGNOSIS — E785 Hyperlipidemia, unspecified: Secondary | ICD-10-CM

## 2023-12-04 DIAGNOSIS — Z794 Long term (current) use of insulin: Secondary | ICD-10-CM

## 2023-12-04 DIAGNOSIS — I10 Essential (primary) hypertension: Secondary | ICD-10-CM

## 2023-12-04 DIAGNOSIS — Z125 Encounter for screening for malignant neoplasm of prostate: Secondary | ICD-10-CM

## 2023-12-12 LAB — GENECONNECT MOLECULAR SCREEN: Genetic Analysis Overall Interpretation: NEGATIVE

## 2023-12-27 ENCOUNTER — Other Ambulatory Visit: Payer: Self-pay | Admitting: Family Medicine

## 2023-12-28 ENCOUNTER — Encounter: Payer: Self-pay | Admitting: Neurology

## 2023-12-28 ENCOUNTER — Other Ambulatory Visit: Payer: Self-pay

## 2023-12-28 MED ORDER — LISINOPRIL 10 MG PO TABS: 10.0000 mg | ORAL_TABLET | Freq: Every day | ORAL | 3 refills | Status: AC

## 2023-12-29 ENCOUNTER — Other Ambulatory Visit: Payer: Self-pay | Admitting: *Deleted

## 2023-12-29 DIAGNOSIS — G4733 Obstructive sleep apnea (adult) (pediatric): Secondary | ICD-10-CM

## 2024-01-22 ENCOUNTER — Other Ambulatory Visit: Payer: Self-pay | Admitting: Family Medicine

## 2024-01-22 DIAGNOSIS — E119 Type 2 diabetes mellitus without complications: Secondary | ICD-10-CM

## 2024-01-23 ENCOUNTER — Other Ambulatory Visit: Payer: Self-pay

## 2024-01-23 DIAGNOSIS — E119 Type 2 diabetes mellitus without complications: Secondary | ICD-10-CM

## 2024-01-23 MED ORDER — LANTUS SOLOSTAR 100 UNIT/ML ~~LOC~~ SOPN: PEN_INJECTOR | SUBCUTANEOUS | 3 refills | Status: AC

## 2024-02-27 ENCOUNTER — Other Ambulatory Visit: Payer: Self-pay | Admitting: Family Medicine

## 2024-03-04 ENCOUNTER — Ambulatory Visit: Payer: Self-pay

## 2024-03-04 ENCOUNTER — Ambulatory Visit: Admitting: Nurse Practitioner

## 2024-03-04 VITALS — BP 143/79 | HR 76 | Temp 100.6°F | Ht 62.0 in | Wt 174.8 lb

## 2024-03-04 DIAGNOSIS — F410 Panic disorder [episodic paroxysmal anxiety] without agoraphobia: Secondary | ICD-10-CM | POA: Diagnosis not present

## 2024-03-04 DIAGNOSIS — F419 Anxiety disorder, unspecified: Secondary | ICD-10-CM | POA: Diagnosis not present

## 2024-03-04 MED ORDER — CLONAZEPAM 0.5 MG PO TABS
ORAL_TABLET | ORAL | 0 refills | Status: DC
Start: 2024-03-04 — End: 2024-04-15

## 2024-03-04 MED ORDER — BUSPIRONE HCL 7.5 MG PO TABS: 7.5000 mg | ORAL_TABLET | Freq: Two times a day (BID) | ORAL | 0 refills | Status: DC

## 2024-03-04 NOTE — Telephone Encounter (Signed)
 FYI Only or Action Required?: FYI only for provider.  Patient was last seen in primary care on 12/01/2023 by Emily Braun LABOR, MD. Called Nurse Triage reporting Anxiety. Symptoms began several weeks ago. Interventions attempted: Prescription medications: sertraline. Symptoms are: unchanged.  Triage Disposition: See PCP When Office is Open (Within 3 Days)  Patient/caregiver understands and will follow disposition?: Yes  Pt had appt for 03/15/24 but wanting to be seen sooner d/t anxiety increased and not getting much better. Pt r/s for today at 350 with [name], NP.   Copied from CRM [id number]. Topic: Clinical - Red Word Triage >> Mar 04, 2024  8:25 AM Ivette P wrote: [facility] that prompted transfer to Nurse Triage: Panic attack, not getting any better. had a week half ago. cannot eat, Not feeling well, almost had another panic attack last night. Reason for Disposition  MODERATE anxiety (e.g., persistent or frequent anxiety symptoms; interferes with sleep, school, or work)  Answer Assessment - Initial Assessment Questions 1. CONCERN: Did anything happen that prompted you to call today?      Had panic attack prior to going to dentist  2. ANXIETY SYMPTOMS: Can you describe how you (your loved one; patient) have been feeling? (e.g., tense, restless, panicky, anxious, keyed up, overwhelmed, sense of impending doom).      Anxiety and overwhelmed  3. ONSET: How long have you been feeling this way? (e.g., hours, days, weeks)     1.5 week ago  4. SEVERITY: How would you rate the level of anxiety? (e.g., 0 - 10; or mild, moderate, severe).     Mild to moderate  5. FUNCTIONAL IMPAIRMENT: How have these feelings affected your ability to do daily activities? Have you had more difficulty than usual doing your normal daily activities? (e.g., getting better, same, worse; self-care, school, work, interactions)     yes 6. HISTORY: Have you felt this way before? Have you ever been diagnosed with an  anxiety problem in the past? (e.g., generalized anxiety disorder, panic attacks, PTSD). If Yes, ask: How was this problem treated? (e.g., medicines, counseling, etc.)     On sertraline but been on for years  7. RISK OF HARM - SUICIDAL IDEATION: Do you ever have thoughts of hurting or killing yourself? If Yes, ask:  Do you have these feelings now? Do you have a plan on how you would do this?     no 8. TREATMENT:  What has been done so far to treat this anxiety? (e.g., medicines, relaxation strategies). What has helped?     Was able to calm self down  12. OTHER SYMPTOMS: Do you have any other symptoms? (e.g., feeling depressed, trouble concentrating, trouble sleeping, trouble breathing, palpitations or fast heartbeat, chest pain, sweating, nausea, or diarrhea)       Nausea, chest heaviness, not feeling well  Protocols used: Anxiety and Panic Attack-A-AH

## 2024-03-05 ENCOUNTER — Encounter: Payer: Self-pay | Admitting: Nurse Practitioner

## 2024-03-05 DIAGNOSIS — F410 Panic disorder [episodic paroxysmal anxiety] without agoraphobia: Secondary | ICD-10-CM | POA: Insufficient documentation

## 2024-03-05 NOTE — Progress Notes (Signed)
 Subjective:    Patient ID: Emily Braun, female    DOB: May 03, 1976, 48 y.o.   MRN: [id number]  HPI Presents for recheck on her anxiety.  Had a panic attack about 1-1/2 weeks ago.  Since then has had some nausea with occasional gagging.  Not eating well.  Has been sleeping well.  Denies any heartburn or reflux.  Currently on semaglutide  but has not had any problems up until the panic attack.  Has lost a significant amount of weight over the past week and a half.  Personal life has been stable.  Currently in a relationship but denies any problems adding to her anxiety.  Has 2 adopted children.  Cannot identify any specific incident triggering the anxiety.  Had a similar issue years ago when her grandfather passed away.  Denies any significant OCD symptoms.  States she has a significant fear of the dentist related to a migraine headache in the past.  Has switched her CPAP to nasal pillows which is working much better at nighttime.  Surgical history includes cholecystectomy.  Denies suicidal homicidal thoughts or ideation.  Denies any self-harm behaviors.   Review of Systems  Constitutional:  Positive for appetite change, fatigue and unexpected weight change.  Respiratory:  Positive for chest tightness. Negative for cough and shortness of breath.   Cardiovascular:  Positive for palpitations. Negative for chest pain.  Gastrointestinal:  Positive for nausea. Negative for vomiting.  Psychiatric/Behavioral:  Positive for agitation. Negative for self-injury, sleep disturbance and suicidal ideas. The patient is nervous/anxious.       03/04/2024    3:53 PM  Depression screen PHQ 2/9  Decreased Interest 0  Down, Depressed, Hopeless 2  PHQ - 2 Score 2  Altered sleeping 0  Tired, decreased energy 0  Change in appetite 3  Feeling bad or failure about yourself  3  Trouble concentrating 0  Moving slowly or fidgety/restless 0  Suicidal thoughts 0  PHQ-9 Score 8  Difficult doing work/chores Somewhat  difficult      03/04/2024    3:53 PM 12/01/2023    8:49 AM 09/18/2023    8:47 AM 09/18/2023    8:44 AM  GAD 7 : Generalized Anxiety Score  Nervous, Anxious, on Edge 3 1 1  0  Control/stop worrying 3 1 1  0  Worry too much - different things 3 1 1  0  Trouble relaxing 0 1 1 0  Restless 0 1 1 0  Easily annoyed or irritable 1 1 1  0  Afraid - awful might happen 3 1 1  0  Total GAD 7 Score 13 7 7  0  Anxiety Difficulty Somewhat difficult Not difficult at all Not difficult at all Not difficult at all    Social History   Tobacco Use   Smoking status: Never   Smokeless tobacco: Never  Vaping Use   Vaping status: Never Used  Substance Use Topics   Alcohol use: Not Currently    Comment: socially   Drug use: Never        Objective:   Physical Exam Vitals and nursing note reviewed.  Constitutional:      General: She is not in acute distress. Cardiovascular:     Rate and Rhythm: Normal rate and regular rhythm.  Pulmonary:     Effort: Pulmonary effort is normal.     Breath sounds: Normal breath sounds.  Abdominal:     General: There is no distension.     Palpations: Abdomen is soft. There is no mass.  Tenderness: There is abdominal tenderness. There is no guarding or rebound.     Comments: Minimal epigastric area tenderness noted.  Neurological:     Mental Status: She is alert and oriented to person, place, and time.  Psychiatric:        Mood and Affect: Mood normal.        Behavior: Behavior normal.        Thought Content: Thought content normal.        Judgment: Judgment normal.     Comments: Moderately anxious affect.  Speech clear.  Making good eye contact.  Dressed appropriately for the weather.  Thoughts logical coherent and relevant.  Tearful at times.    Today's Vitals   03/04/24 1549 03/04/24 1555  BP: (!) 141/84 (!) 143/79  Pulse: 76   Temp: (!) 100.6 F (38.1 C)   SpO2: 99%   Weight: 174 lb 12.8 oz (79.3 kg)   Height: 5' 2 (1.575 m)    Body mass index is  31.97 kg/m.        Assessment & Plan:   Problem List Items Addressed This Visit       Other   Anxiety - Primary   Relevant Medications   busPIRone  (BUSPAR ) 7.5 MG tablet   Other Relevant Orders   Ambulatory referral to Psychology   Panic attack   Relevant Medications   busPIRone  (BUSPAR ) 7.5 MG tablet   Meds ordered this encounter  Medications   busPIRone  (BUSPAR ) 7.5 MG tablet    Sig: Take 1 tablet (7.5 mg total) by mouth 2 (two) times daily.    Dispense:  60 tablet    Refill:  0    Supervising Provider:   ALPHONSA HAMILTON A [[id number]]   clonazePAM  (KLONOPIN ) 0.5 MG tablet    Sig: Take 1/2 - 1 tab po BID prn severe anxiety or panic attack    Dispense:  20 tablet    Refill:  0    Supervising Provider:   ALPHONSA HAMILTON A [[id number]]   Discussed options regarding anxiety and panic attacks.  She has been on citalopram  for many years.  Trial of adding BuSpar  7.5 mg twice daily.  Discontinue medication and contact office if any significant side effects.  Plan to slowly titrate dose up to maximum if needed and tolerated. A second option will be to increase her citalopram  dose. Prescription of low-dose clonazepam  to have on hand in case of panic attacks or severe anxiety including her upcoming visit to the dentist. Referred for counseling. Patient to contact office in the next month to let us  know how the BuSpar  is doing as far as the anxiety. Return if symptoms worsen or fail to improve.

## 2024-03-07 ENCOUNTER — Encounter: Payer: Self-pay | Admitting: Nurse Practitioner

## 2024-03-12 ENCOUNTER — Ambulatory Visit: Admitting: Family Medicine

## 2024-03-12 VITALS — BP 129/81 | HR 66 | Temp 100.0°F | Ht 62.0 in | Wt 170.6 lb

## 2024-03-12 DIAGNOSIS — E119 Type 2 diabetes mellitus without complications: Secondary | ICD-10-CM | POA: Diagnosis not present

## 2024-03-12 DIAGNOSIS — Z794 Long term (current) use of insulin: Secondary | ICD-10-CM

## 2024-03-12 DIAGNOSIS — F419 Anxiety disorder, unspecified: Secondary | ICD-10-CM

## 2024-03-12 MED ORDER — CITALOPRAM HYDROBROMIDE 40 MG PO TABS: 40.0000 mg | ORAL_TABLET | Freq: Every day | ORAL | 5 refills | Status: DC

## 2024-03-12 NOTE — Progress Notes (Signed)
   Subjective:    Patient ID: Emily Braun, female    DOB: July 03, 1976, 48 y.o.   MRN: [id number]  HPI Pt comes in today as a follow up from her visit with Elveria Quarry last week on 03/04/24. Pt states medications is not working and she is still unable to eat. Medications and allergies reviewed.    History of Present Illness   The patient, with type 2 diabetes mellitus, presents with anxiety and difficulty eating following a recent dental visit.  She has been experiencing anxiety and difficulty eating for the past three weeks, which began after a dental visit for a broken tooth. An anxiety attack occurred a few hours before the dental appointment, triggered by the fear of a migraine similar to past experiences with dental issues.  Since the dental visit, she has experienced a sensation of heaviness on her chest, which has since resolved. She has been unable to eat properly, resulting in a weight loss of twelve pounds. The most she has eaten in three weeks was half a cheeseburger on Sunday.  She was prescribed buspirone , which she took from Monday to Friday, but it worsened her condition, causing drowsiness, tremors, and a sensation going up and down her body. She stopped the medication on Friday and has not taken it since.  She is concerned about her blood sugar levels as she has been unable to eat properly while still taking insulin . She has not used insulin  since Friday. She has tried a continuous glucose monitor in the past, but it did not stay on.  She has been on citalopram  since the death of a person named Emily Braun, which was a long time ago. She has not engaged in counseling before but is open to it. She has not used clonazepam  recently and is not experiencing symptoms of hypoglycemia, but is worried about the possibility. She is able to eat more at dinner than at other meals.     Patient relates she tried freestyle libre and kept falling off her skin even when she applied additional  patch it kept falling off she is interested in getting further evaluation for possibility of Dexcom  Review of Systems     Objective:   Physical Exam  Lungs clear heart regular      Assessment & Plan:  Stress related issues Needs counseling Bump up dose of Celexa  to 40 mg Hold off on BuSpar  Klonopin  only if necessary  Will also see if we can get clinical pharmacist to initiate continuous glucose monitor For now patient is holding off the insulin

## 2024-03-14 ENCOUNTER — Encounter: Payer: Self-pay | Admitting: Family Medicine

## 2024-03-14 ENCOUNTER — Telehealth: Payer: Self-pay | Admitting: Family Medicine

## 2024-03-14 ENCOUNTER — Encounter: Payer: Self-pay | Admitting: Nurse Practitioner

## 2024-03-14 NOTE — Telephone Encounter (Signed)
 I sent patient a MyChart message regarding [facility] Emily Braun send her message regarding a new cancer Patient will decide what direction she will let us  know if she needs our help  [facility]-their phone number [phone] Their fax number [phone]

## 2024-03-15 ENCOUNTER — Other Ambulatory Visit: Payer: Self-pay

## 2024-03-15 ENCOUNTER — Ambulatory Visit: Admitting: Nurse Practitioner

## 2024-03-15 DIAGNOSIS — E119 Type 2 diabetes mellitus without complications: Secondary | ICD-10-CM

## 2024-03-16 ENCOUNTER — Ambulatory Visit: Payer: Self-pay | Admitting: Family Medicine

## 2024-03-16 ENCOUNTER — Telehealth: Payer: Self-pay | Admitting: Family Medicine

## 2024-03-16 LAB — HEMOGLOBIN A1C
Est. average glucose Bld gHb Est-mCnc: 123 mg/dL
Hgb A1c MFr Bld: 5.9 % — ABNORMAL HIGH (ref 4.8–5.6)

## 2024-03-16 NOTE — Telephone Encounter (Signed)
 Nurses Please talk with patient She was having a difficult time having the glucose monitor stay affixed to her skin I reached out to clinical pharmacy They recommended the below Please talk with the patient's see if she is willing to try that If she needs a new monitor and sensors sent in please do so She has type 2 diabetes on insulin   If she would like to do a consultation with a clinical pharmacist to discuss this further we can easily set that up and you all can put in a referral for her to see Lang Loletha Hamilton See below please  Pandora Lang, RPH  Billee Mliss [facility], [facility]; Alphonsa Hamilton LABOR, MD Hi Dr Hamilton - I would recommend for patient to try Skin Tac, which comes as an adhesive barrier wipe to be used prior to applying the sensor, and can be used for where the overpatch is applied as well.  If you think patient would benefit from pharmacy referral, I would be happy to talk with her in more detail. Thank you, Lang.

## 2024-03-18 ENCOUNTER — Other Ambulatory Visit: Payer: Self-pay

## 2024-03-18 DIAGNOSIS — Z794 Long term (current) use of insulin: Secondary | ICD-10-CM

## 2024-03-18 NOTE — Telephone Encounter (Signed)
 Spoke with patient and she states she would like to go ahead a new order for a cgm system, may need skin tac as well. Referral to pharmacy assist placed.

## 2024-03-21 ENCOUNTER — Telehealth: Payer: Self-pay

## 2024-03-21 NOTE — Progress Notes (Signed)
 Care Guide Pharmacy Note  03/21/2024 Name: Emily Braun MRN: [id number] DOB: 26-Feb-1976  Referred By: Alphonsa Glendia LABOR, MD Reason for referral: Complex Care Management (Outreach to schedule with Pharm d )   Emily Braun is a 48 y.o. year old female who is a primary care patient of Alphonsa Glendia LABOR, MD.  Emily Braun was referred to the pharmacist for assistance related to: DMII  Successful contact was made with the patient to discuss pharmacy services including being ready for the pharmacist to call at least 5 minutes before the scheduled appointment time and to have medication bottles and any blood pressure readings ready for review. The patient agreed to meet with the pharmacist via telephone visit on (date/time).04/01/2024  Jeoffrey Buffalo , RMA     [place]  [facility], [facility] Guide  Direct Dial: [phone]  Website: [email or web address]

## 2024-03-21 NOTE — Progress Notes (Signed)
   Set up date on 02/02/24

## 2024-03-25 ENCOUNTER — Ambulatory Visit: Admitting: Neurology

## 2024-03-25 ENCOUNTER — Encounter: Payer: Self-pay | Admitting: Neurology

## 2024-03-25 VITALS — BP 130/80 | HR 81 | Ht 62.0 in | Wt 169.5 lb

## 2024-03-25 DIAGNOSIS — I10 Essential (primary) hypertension: Secondary | ICD-10-CM

## 2024-03-25 DIAGNOSIS — G44229 Chronic tension-type headache, not intractable: Secondary | ICD-10-CM | POA: Diagnosis not present

## 2024-03-25 DIAGNOSIS — G43009 Migraine without aura, not intractable, without status migrainosus: Secondary | ICD-10-CM | POA: Diagnosis not present

## 2024-03-25 DIAGNOSIS — G5603 Carpal tunnel syndrome, bilateral upper limbs: Secondary | ICD-10-CM

## 2024-03-25 DIAGNOSIS — G4733 Obstructive sleep apnea (adult) (pediatric): Secondary | ICD-10-CM | POA: Diagnosis not present

## 2024-03-25 DIAGNOSIS — G4489 Other headache syndrome: Secondary | ICD-10-CM

## 2024-03-25 DIAGNOSIS — M542 Cervicalgia: Secondary | ICD-10-CM

## 2024-03-25 NOTE — Patient Instructions (Signed)
 Consider the Dreamwear Full face mask

## 2024-03-25 NOTE — Progress Notes (Signed)
 [facility]  PATIENT: Emily Braun DOB: Jan 23, 1976  REFERRING DOCTOR OR PCP: Glendia Fielding SOURCE: Patient, notes from primary care, imaging reports.  _________________________________   HISTORICAL  CHIEF COMPLAINT:  Chief Complaint  Patient presents with   Follow-up    RM 11, alone. CPAP f/u. Not tolerating CPAP well. Originally had full face mask but felt claustrophobic. Changed to nasal mask but still not tolerating well.     HISTORY OF PRESENT ILLNESS:   Emily Braun is a 48 year old woman with headaches and sleep apnea.  Update 03/25/2024 She was diagnosed with OSA 04/2021.  She was initially reluctant to use CPAP as she has a young child and she wants to make sure she can hear at night.  However, since she was unable to tolerate the mouthguard (due to migranes), she started CPAP.  She started Auto-PAP 5-18 but compliance is poor at 7/30 days and efficacy is suboptimal (AHI=16).  Initially she tried a FFM but was claustophobic and had a panic attack.   She skipped it for a while and she switched to a nostril mask.   When pressures become high it is uncomfortable.    She has some sleepiness but not as much as last year.   She gained some weight and was on Mounjaro  initially but felt sick so switched to Ozempic .  She lost 15 pounds in 3 weeks and another 5 pounds since then.   HA's improved after the TPI/ONB.  Previous shot helped 1 year.  .   She has only had a couple minor HA a month until afew weeks ago when HA intensified again.  She has not had severe migraines with N/V, photophobia and phonophobia.     She denies any more diplopia.   She was told she had  some EOM misalignment.   She had an MRI of the brain 03/07/2022 (normal, I concur).    I previousl saw her for CTS.  NCV showed moderate right and mild left CTS.   She had right CTR   EPWORTH SLEEPINESS SCALE  On a scale of 0 - 3 what is the chance of dozing:  Sitting and Reading: Watching  TV: Sitting inactive in a public place: Passenger in car for one hour: Lying down to rest in the afternoon: Sitting and talking to someone: Sitting quietly after lunch: In a car, stopped in traffic:  Total (out of 24):    8/24    Impression: This NCV/EMG study shows neurophysiologic evidence of: 1.   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 2.   Mild median neuropathy across the left wrist 3.   Probable mild left C8 and possible minimal left C5 or C6 chronic radiculopathies   REVIEW OF SYSTEMS: Constitutional: No fevers, chills, sweats, or change in appetite Eyes: No visual changes, double vision, eye pain Ear, nose and throat: No hearing loss, ear pain, nasal congestion, sore throat Cardiovascular: No chest pain, palpitations Respiratory:  No shortness of breath at rest or with exertion.   No wheezes GastrointestinaI: No nausea, vomiting, diarrhea.   she has GERD.   Genitourinary:  No dysuria.  She has urinary frequency. Musculoskeletal: She reports neck pain.  No back pain. Integumentary: No rash, pruritus, skin lesions Neurological: as above Psychiatric: No depression at this time.  No anxiety Endocrine: No palpitations, diaphoresis, change in appetite, change in weigh or increased thirst Hematologic/Lymphatic:  No anemia, purpura, petechiae. Allergic/Immunologic: No itchy/runny eyes, nasal congestion, recent allergic reactions, rashes  ALLERGIES: Allergies  Allergen Reactions   Bydureon  [Exenatide ] Itching    Caused knots in her skin as well as itching at the site of injection   Metformin  And Related Diarrhea   Buspar  [Buspirone ] Other (See Comments)    Felt trembling   Mounjaro  [Tirzepatide ]     Abdominal cramps nausea vomiting   Tradjenta  [Linagliptin ]     Patient relates abdominal cramps and diarrhea    HOME MEDICATIONS:  Current Outpatient Medications:    budesonide -formoterol  (SYMBICORT ) 80-4.5 MCG/ACT inhaler, Inhale 2 puffs into the lungs 2  (two) times daily., Disp: 1 each, Rfl: 5   Calcium Carbonate-Vitamin D (CALCIUM + D PO), Take 1 tablet by mouth daily., Disp: , Rfl:    citalopram  (CELEXA ) 40 MG tablet, Take 1 tablet (40 mg total) by mouth daily., Disp: 30 tablet, Rfl: 5   clonazePAM  (KLONOPIN ) 0.5 MG tablet, Take 1/2 - 1 tab po BID prn severe anxiety or panic attack, Disp: 20 tablet, Rfl: 0   famotidine  (PEPCID ) 40 MG tablet, 1 every day prn GERD, Disp: 90 tablet, Rfl: 1   fluticasone  (FLONASE ) 50 MCG/ACT nasal spray, Use 2 spray(s) in each nostril once daily, Disp: 16 g, Rfl: 0   LANTUS  SOLOSTAR 100 UNIT/ML Solostar Pen, Use 20 units each evening and may titrate up to 55 units depending on glucose readings and doctor direction, Disp: 15 mL, Rfl: 3   lisinopril  (ZESTRIL ) 10 MG tablet, Take 1 tablet (10 mg total) by mouth daily., Disp: 90 tablet, Rfl: 3   loratadine (CLARITIN) 10 MG tablet, Take 10 mg by mouth at bedtime. , Disp: , Rfl:    Norethindrone -Ethinyl Estradiol-Fe Biphas (LO LOESTRIN FE ) 1 MG-10 MCG / 10 MCG tablet, Take 1 tablet by mouth daily., Disp: 84 tablet, Rfl: 3   oxybutynin (DITROPAN) 5 MG tablet, Take 5 mg by mouth 2 (two) times daily., Disp: , Rfl:    pantoprazole  (PROTONIX ) 40 MG tablet, TAKE 1 TABLET BY MOUTH ONCE DAILY FOR  ACID  REFLUX, Disp: 90 tablet, Rfl: 1   pravastatin  (PRAVACHOL ) 20 MG tablet, TAKE 1 TABLET BY MOUTH TWICE A WEEK ON  MONDAYS  AND  FRIDAYS, Disp: 24 tablet, Rfl: 1   rizatriptan  (MAXALT -MLT) 10 MG disintegrating tablet, Take 1 tablet (10 mg total) by mouth as needed for migraine. May repeat in 2 hours if needed, Disp: 9 tablet, Rfl: 11   Semaglutide , 1 MG/DOSE, (OZEMPIC , 1 MG/DOSE,) 4 MG/3ML SOPN, Inject 1 mg into the skin once a week., Disp: 3 mL, Rfl: 6   triamcinolone  cream (KENALOG ) 0.1 %, Apply 1 Application topically 2 (two) times daily., Disp: 30 g, Rfl: 1  Current Facility-Administered Medications:    bupivacaine (PF) (MARCAINE ) 0.5 % injection 3 mL, 3 mL, Other, Once, Paityn Balsam,  Charlie LABOR, MD  PAST MEDICAL HISTORY: Past Medical History:  Diagnosis Date   Adhesive capsulitis of left shoulder 06/02/2021   Anxiety    Arthritis    Cancer (HCC)    skin cancer removed from back   Carpal tunnel syndrome, left    Depression    Family history of adverse reaction to anesthesia    brother fights when wakes up   Fracture of humerus, proximal, left, closed 06/21/2020   GERD (gastroesophageal reflux disease)    Headache    History of hiatal hernia    Hypertension    followed by pcp   (06-25-2020 pt stated never had stress test)   IBS (irritable bowel syndrome)    Mixed stress and urge  urinary incontinence    urologist--- dr gaston   Pre-diabetes    Seasonal allergies    Sleep apnea 06/02/2021   no cpap used osa per sleep study   Wears glasses     PAST SURGICAL HISTORY: Past Surgical History:  Procedure Laterality Date   CHOLECYSTECTOMY     2007   COLONOSCOPY WITH PROPOFOL      02/22/2013  dr jakie   ESOPHAGOGASTRODUODENOSCOPY (EGD) WITH PROPOFOL      08-08-2016  dr legrand   HARDWARE REMOVAL Left 05/11/2022   Procedure: HARDWARE REMOVAL;  Surgeon: Cristy Bonner DASEN, MD;  Location: [facility];  Service: Orthopedics;  Laterality: Left;   LAPAROSCOPIC CHOLECYSTECTOMY  09-10-2007  @[facility]    laparoscopic   MAXILLARY ANTROSTOMY Left 03/10/2014   Procedure: LEFT MAXILLARY ANTROSTOMY;  Surgeon: Ana LELON Moccasin, MD;  Location: [facility];  Service: ENT;  Laterality: Left;   ORIF HUMERUS FRACTURE Left 06/30/2020   Procedure: OPEN REDUCTION INTERNAL FIXATION (ORIF) PROXIMAL HUMERUS FRACTURE;  Surgeon: Beverley Evalene BIRCH, MD;  Location: [facility];  Service: Orthopedics;  Laterality: Left;   ORIF PATELLA FRACTURE Left 2011   REVERSE SHOULDER ARTHROPLASTY Left 05/11/2022   Procedure: REVERSE SHOULDER ARTHROPLASTY;  Surgeon: Cristy Bonner DASEN, MD;  Location: [facility];  Service: Orthopedics;  Laterality: Left;   SHOULDER CLOSED REDUCTION Left 06/08/2021    Procedure: CLOSED MANIPULATION SHOULDER, WITH INJECTION;  Surgeon: Beverley Evalene BIRCH, MD;  Location: [facility] [place];  Service: Orthopedics;  Laterality: Left;   WISDOM TOOTH EXTRACTION     yrs ago per pt on 06-02-2021    FAMILY HISTORY: Family History  Problem Relation Age of Onset   Diabetes Father    Hypertension Father    Diabetes Maternal Grandmother    Diabetes Paternal Grandmother    Hypertension Mother    Cancer - Other Brother        testicular   Esophageal cancer Neg Hx     SOCIAL HISTORY:  Social History   Socioeconomic History   Marital status: Single    Spouse name: Not on file   Number of children: 1   Years of education: Not on file   Highest education level: Not on file  Occupational History   Occupation: patient coordinator    Comment: [employer]  Tobacco Use   Smoking status: Never   Smokeless tobacco: Never  Vaping Use   Vaping status: Never Used  Substance and Sexual Activity   Alcohol use: Not Currently    Comment: socially   Drug use: Never   Sexual activity: Not Currently    Birth control/protection: Pill  Other Topics Concern   Not on file  Social History Narrative   Lives with foster-son   Caffeine use: sometimes   Right handed    Social Drivers of Corporate investment banker Strain: Not on file  Food Insecurity: Not on file  Transportation Needs: Not on file  Physical Activity: Not on file  Stress: Not on file  Social Connections: Unknown (12/31/2021)   Received from [facility]   Social Network    Social Network: Not on file  Intimate Partner Violence: Unknown (12/01/2021)   Received from [facility]   HITS    Physically Hurt: Not on file    Insult or Talk Down To: Not on file    Threaten Physical Harm: Not on file    Scream or Curse: Not on file     PHYSICAL EXAM  Vitals:  03/25/24 1401  BP: 130/80  Pulse: 81  Weight: 169 lb 8 oz (76.9 kg)  Height: 5' 2 (1.575 m)    Body mass  index is 31 kg/m.   General: The patient is well-developed and well-nourished and in no acute distress.        Neck: The neck has good range of motion.  There is no occipital tenderness  Extremities: She has a right carpal tunnel release scar from surgery since her last visit   Neurologic Exam  Mental status: The patient is alert and oriented x 3 at the time of the examination.    Speech is normal.  Cranial nerves: Extraocular movements are full.    Motor:  Muscle bulk is normal.   Tone is normal. Strength is  5 / 5 in all 4 extremities including right abductor pollicis brevis.     Sensory: Sensory testing is intact to pinprick, soft touch and vibration sensation in the arms now  Gait and station: Station is normal.  The gait and tandem gait are normal.  Romberg is negative.  Reflexes: Deep tendon reflexes are symmetric and normal in the arms     ASSESSMENT AND PLAN  1. OSA (obstructive sleep apnea)   2. Migraine without aura and without status migrainosus, not intractable   3. Essential hypertension, benign   4. Chronic tension-type headache, not intractable   5. Neck pain   6. Other headache syndrome   7. Bilateral carpal tunnel syndrome       1.  She is having difficulty tolerating CPAP for her OSA.  We discussed some other mask options.  If she continues to lose weight, she might be fine without treatment..  2.  HA better since the Bilateral splenius capitus TPI .  Consider zonisamide or TPM if worsens.  Or repeat TPI 3.   Rtc 6 months or call us  back if HA worsens  She is advised to let us  know if she has any new or worsening neurologic symptoms.   Josalyn Dettmann A. Vear, MD, PhD, DIEDRA 03/25/2024, 3:58 PM Certified in Neurology, Clinical Neurophysiology, Sleep Medicine and Neuroimaging  [facility] [address] [place], [place] [address] [phone]

## 2024-03-25 NOTE — Progress Notes (Signed)
 SABRA

## 2024-03-26 LAB — HM DIABETES EYE EXAM

## 2024-03-31 ENCOUNTER — Other Ambulatory Visit: Payer: Self-pay | Admitting: Family Medicine

## 2024-04-01 ENCOUNTER — Other Ambulatory Visit: Payer: Self-pay

## 2024-04-01 NOTE — Progress Notes (Unsigned)
   04/01/2024  Patient ID: Emily Braun, female   DOB: Jun 22, 1976, 48 y.o.   MRN: [id number]  Spoke to patient regarding CGM devices, provided my recommendation on skin-tac. Patient would be okay covering the cost of this should it be needed to help skin to sensor contact.  I called [employer] and representative stated that Libre CGM and Dexcom CGM would be covered in full. Given that the [place] CGM did not work out previously, I will pend order for [employer].  Overpatches can be provided through dexcom at no charge #10 patches/30 days supply: [email or web address]://[email or web address].[email or web address].[email or web address]   Additional information on adhesives: https://www.dexcom.com/faqs/adhesive-tips  Future Appointments  Date Time Provider Department Center  04/02/2024  3:30 PM Mauro Elveria BROCKS, NP [facility]  04/15/2024  3:00 PM Pandora Cadet, [facility] None  05/31/2024  1:50 PM Alphonsa Glendia LABOR, MD [facility]  10/28/2024  8:30 AM Sater, Charlie LABOR, MD [facility] None     Follow-up plan: order pended to Dr Glendia, patient informed of Dexcom information, telephone f/u set for 2 weeks. Patient sees Elveria Mauro tomorrow 8/5. Encouraged outreach with any questions at my direct line below.  Cadet Pandora, PharmD, BCGP Clinical Pharmacist  [phone]

## 2024-04-02 ENCOUNTER — Ambulatory Visit (INDEPENDENT_AMBULATORY_CARE_PROVIDER_SITE_OTHER): Admitting: Nurse Practitioner

## 2024-04-02 ENCOUNTER — Encounter: Payer: Self-pay | Admitting: Nurse Practitioner

## 2024-04-02 VITALS — BP 130/72 | HR 73 | Temp 97.7°F | Ht 62.0 in | Wt 166.0 lb

## 2024-04-02 DIAGNOSIS — N912 Amenorrhea, unspecified: Secondary | ICD-10-CM

## 2024-04-02 DIAGNOSIS — I10 Essential (primary) hypertension: Secondary | ICD-10-CM | POA: Diagnosis not present

## 2024-04-02 DIAGNOSIS — E119 Type 2 diabetes mellitus without complications: Secondary | ICD-10-CM

## 2024-04-02 DIAGNOSIS — R630 Anorexia: Secondary | ICD-10-CM | POA: Diagnosis not present

## 2024-04-02 DIAGNOSIS — F419 Anxiety disorder, unspecified: Secondary | ICD-10-CM

## 2024-04-02 DIAGNOSIS — Z794 Long term (current) use of insulin: Secondary | ICD-10-CM

## 2024-04-02 DIAGNOSIS — R634 Abnormal weight loss: Secondary | ICD-10-CM

## 2024-04-02 LAB — POCT URINE PREGNANCY: Preg Test, Ur: NEGATIVE

## 2024-04-02 NOTE — Progress Notes (Unsigned)
 Subjective:    Patient ID: Emily Braun, female    DOB: March 08, 1976, 48 y.o.   MRN: [id number]  HPI Nursing note: Anxiety - pt states is till not able to eat due to abd pain after eating For over a month now   Continues to have no appetite with frequent nausea. No vomiting. No fever. No acid reflux. No abdominal pain. Has episodes of diarrhea. No constipation or blood in her stool. Unassociated with particular foods. Worse in the mornings when she has increased anxiety. Typically will not eat until at least midday at lunch. Has seen some slight improvement. Yesterday ate one slice of pizza and for dinner had a plain hotdog. That was all she ate all day. She has been holding off on insulin  if she does not eat or has limited intake. Her citalopram  dose was increased to 40 mg on 7/15 which has helped. Has also ended her recent relationship which has helped her mentally. Has tried to reach out for counseling. One did not accept insurance. One given at last visit had first appointment in September.  Denies suicidal or homicidal thoughts or ideation. Denies any self harm behaviors.  Of note, the team pharmacist has reached out to her about CGM. She tried [employer] twice but did not stay on. Will try Dexcom 7. Has been on Semaglutide  for almost a year with no issues.  Some amenorrhea with current pill otherwise, regular menses with light to normal flow. No missed pills.   Review of Systems  Constitutional:  Positive for appetite change and fatigue.  HENT:  Negative for sore throat and trouble swallowing.   Respiratory:  Negative for cough, chest tightness and shortness of breath.   Cardiovascular:  Negative for chest pain.  Gastrointestinal:  Positive for diarrhea, nausea and vomiting. Negative for abdominal pain, blood in stool and constipation.  Psychiatric/Behavioral:  Negative for self-injury and suicidal ideas. The patient is nervous/anxious.       04/02/2024    3:32 PM  Depression  screen PHQ 2/9  Decreased Interest 0  Down, Depressed, Hopeless 1  PHQ - 2 Score 1  Altered sleeping 0  Tired, decreased energy 1  Change in appetite 3  Feeling bad or failure about yourself  1  Trouble concentrating 0  Moving slowly or fidgety/restless 0  Suicidal thoughts 0  PHQ-9 Score 6  Difficult doing work/chores Somewhat difficult      04/02/2024    3:34 PM 03/12/2024   11:32 AM 03/04/2024    3:53 PM 12/01/2023    8:49 AM  GAD 7 : Generalized Anxiety Score  Nervous, Anxious, on Edge 3 3 3 1   Control/stop worrying 1 3 3 1   Worry too much - different things 1 1 3 1   Trouble relaxing 1 1 0 1  Restless 1 1 0 1  Easily annoyed or irritable 1 0 1 1  Afraid - awful might happen 1 0 3 1  Total GAD 7 Score 9 9 13 7   Anxiety Difficulty Somewhat difficult Somewhat difficult Somewhat difficult Not difficult at all    Social History   Tobacco Use   Smoking status: Never   Smokeless tobacco: Never  Vaping Use   Vaping status: Never Used  Substance Use Topics   Alcohol use: Not Currently    Comment: socially   Drug use: Never        Objective:   Physical Exam Vitals and nursing note reviewed.  Constitutional:  General: She is not in acute distress. Neck:     Comments: Thyroid non tender to palpation. No mass or goiter noted.  Cardiovascular:     Rate and Rhythm: Normal rate and regular rhythm.     Heart sounds: Normal heart sounds.  Pulmonary:     Effort: Pulmonary effort is normal.     Breath sounds: Normal breath sounds.  Abdominal:     General: Bowel sounds are normal. There is no distension.     Palpations: Abdomen is soft. There is no mass.     Tenderness: There is no abdominal tenderness. There is no guarding or rebound.  Neurological:     Mental Status: She is alert.  Psychiatric:        Mood and Affect: Mood normal.        Behavior: Behavior normal.        Thought Content: Thought content normal.        Judgment: Judgment normal.     Comments:  Mildly anxious affect but much calmer from previous visit on 03/04/24. Making good eye contact. Speech clear.     Today's Vitals   04/02/24 1522  BP: 130/72  Pulse: 73  Temp: 97.7 F (36.5 C)  SpO2: 99%  Weight: 166 lb (75.3 kg)  Height: 5' 2 (1.575 m)   Body mass index is 30.36 kg/m.  Results for orders placed or performed in visit on 04/02/24  POCT urine pregnancy   Collection Time: 04/02/24  4:12 PM  Result Value Ref Range   Preg Test, Ur Negative Negative         Assessment & Plan:   Problem List Items Addressed This Visit       Cardiovascular and Mediastinum   Essential hypertension, benign - Primary     Endocrine   DM (diabetes mellitus), type 2 (HCC)   Relevant Medications   Continuous Glucose Sensor (DEXCOM G7 SENSOR) MISC     Other   Amenorrhea   Relevant Orders   POCT urine pregnancy (Completed)   Anxiety   Loss of appetite   Relevant Orders   POCT urine pregnancy (Completed)   Weight loss, unintentional   Concerned about gastroparesis related to diabetes and/or use of Semaglutide . Hold on Semaglutide  for 2 weeks to see if symptoms improve.  Continue to hold on insulin  if limited to no food intake.  Continue Celexa  40 mg daily. Given information on another counselor to try to get earliest appointment possible. 1-2 months is typical right now. Let us  know if we can assist. ' Order sent for Dexcom7. Will send a message to our pharmacist.  Recommend daily MVI.  Return in about 1 month (around 05/03/2024).

## 2024-04-02 NOTE — Patient Instructions (Signed)
 Adhesive pads

## 2024-04-04 ENCOUNTER — Encounter: Payer: Self-pay | Admitting: Nurse Practitioner

## 2024-04-04 DIAGNOSIS — R630 Anorexia: Secondary | ICD-10-CM

## 2024-04-04 DIAGNOSIS — N912 Amenorrhea, unspecified: Secondary | ICD-10-CM | POA: Insufficient documentation

## 2024-04-04 DIAGNOSIS — R634 Abnormal weight loss: Secondary | ICD-10-CM | POA: Insufficient documentation

## 2024-04-04 HISTORY — DX: Anorexia: R63.0

## 2024-04-04 HISTORY — DX: Abnormal weight loss: R63.4

## 2024-04-04 MED ORDER — DEXCOM G7 SENSOR MISC: 3 refills | Status: AC

## 2024-04-05 ENCOUNTER — Other Ambulatory Visit (HOSPITAL_COMMUNITY): Payer: Self-pay

## 2024-04-05 ENCOUNTER — Telehealth: Payer: Self-pay | Admitting: Pharmacy Technician

## 2024-04-05 NOTE — Telephone Encounter (Signed)
 Pharmacy Patient Advocate Encounter   Received notification from CoverMyMeds that prior authorization for Dexcom G7 sensors is required/requested.   Insurance verification completed.   The patient is insured through [facility] .   Per test claim: PA required; PA submitted to above mentioned insurance via LATENT Key/confirmation #/EOC [id number] Status is pending

## 2024-04-08 NOTE — Telephone Encounter (Signed)
 Pharmacy Patient Advocate Encounter  Received notification from [employer] that Prior Authorization for Dexcom G7 sensors  has been DENIED.  Full denial letter will be uploaded to the media tab. See denial reason below.   PA #/Case ID/Reference #: [id number]

## 2024-04-13 ENCOUNTER — Other Ambulatory Visit: Payer: Self-pay | Admitting: Nurse Practitioner

## 2024-04-15 ENCOUNTER — Telehealth: Payer: Self-pay

## 2024-04-15 ENCOUNTER — Other Ambulatory Visit: Payer: Self-pay

## 2024-04-15 NOTE — Progress Notes (Signed)
   04/15/2024  Patient ID: Emily Braun, female   DOB: November 12, 1975, 48 y.o.   MRN: [id number]   Attempted to contact patient for scheduled appointment for medication management. Left HIPAA compliant message for patient to return my call at their convenience.   Was going to let patient know that her PA was approved through insurance as of today.   Shelanda Agrawal (Key: [id number]) PA Case ID #: [id number] Need Help? Call us  at [phone] Outcome Approved today by [employer] 2017 NCPDP Request Reference Number: [id number]. DEXCOM G7 MIS SENSOR is approved through 04/15/2025. Your patient may now fill this prescription and it will be covered. Effective Date: 04/15/2024 Authorization Expiration Date: 04/15/2025   -Also contacted [facility] to review G7 copay -  would be $125/30 day supply.   Left message for patient to call back, would like to review briefly with her.   Lang Sieve, PharmD, BCGP Clinical Pharmacist  [phone]

## 2024-04-15 NOTE — Progress Notes (Unsigned)
 be $125/30 DS

## 2024-04-18 ENCOUNTER — Other Ambulatory Visit: Payer: Self-pay

## 2024-04-18 ENCOUNTER — Ambulatory Visit: Payer: Self-pay

## 2024-04-18 MED ORDER — CEPHALEXIN 250 MG PO CAPS: 250.0000 mg | ORAL_CAPSULE | Freq: Three times a day (TID) | ORAL | 0 refills | Status: DC

## 2024-04-18 NOTE — Telephone Encounter (Signed)
Medication sent, patient aware.  

## 2024-04-18 NOTE — Telephone Encounter (Signed)
 Recommend Keflex  250 mg 1 taken 3 times daily for 5 days this typically will clear things up if ongoing troubles follow-up

## 2024-04-18 NOTE — Telephone Encounter (Signed)
 FYI Only or Action Required?: FYI only for provider.  Patient was last seen in primary care on 04/02/2024 by Mauro Elveria BROCKS, NP.  Called Nurse Triage reporting Urinary Frequency.  Symptoms began about a month ago.  Interventions attempted: Nothing.  Symptoms are: gradually worsening.  Triage Disposition: See Physician Within 24 Hours  Patient/caregiver understands and will follow disposition?: Yes   Patient tested positive for UTI-no pain but has frequency and urgency to urinate. Please call Cell Phone when you call her back.  Reason for Disposition  Urinating more frequently than usual (i.e., frequency) OR new-onset of the feeling of an urgent need to urinate (i.e., urgency)  Answer Assessment - Initial Assessment Questions 1. SYMPTOM: What's the main symptom you're concerned about? (e.g., frequency, incontinence)     frequency,urgency, blood in urine 2. ONSET: When did the    start?     X 2 weeks 3. PAIN: Is there any pain? If Yes, ask: How bad is it? (Scale: 1-10; mild, moderate, severe)     no 4. CAUSE: What do you think is causing the symptoms?     UTI 5. OTHER SYMPTOMS: Do you have any other symptoms? (e.g., blood in urine, fever, flank pain, pain with urination)     Traces of blood in urine,  6. PREGNANCY: Is there any chance you are pregnant? When was your last menstrual period?     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

## 2024-04-19 ENCOUNTER — Ambulatory Visit: Admitting: Family Medicine

## 2024-05-09 ENCOUNTER — Ambulatory Visit: Admitting: Nurse Practitioner

## 2024-05-13 ENCOUNTER — Ambulatory Visit (INDEPENDENT_AMBULATORY_CARE_PROVIDER_SITE_OTHER): Admitting: Nurse Practitioner

## 2024-05-13 ENCOUNTER — Encounter: Payer: Self-pay | Admitting: Nurse Practitioner

## 2024-05-13 VITALS — BP 133/84 | HR 60 | Ht 62.0 in | Wt 166.0 lb

## 2024-05-13 DIAGNOSIS — R82998 Other abnormal findings in urine: Secondary | ICD-10-CM | POA: Diagnosis not present

## 2024-05-13 DIAGNOSIS — Z794 Long term (current) use of insulin: Secondary | ICD-10-CM | POA: Diagnosis not present

## 2024-05-13 DIAGNOSIS — E119 Type 2 diabetes mellitus without complications: Secondary | ICD-10-CM

## 2024-05-13 DIAGNOSIS — F419 Anxiety disorder, unspecified: Secondary | ICD-10-CM

## 2024-05-13 LAB — POCT URINALYSIS DIPSTICK
Bilirubin, UA: NEGATIVE
Glucose, UA: NEGATIVE
Ketones, UA: NEGATIVE
Leukocytes, UA: NEGATIVE
Nitrite, UA: NEGATIVE
Protein, UA: NEGATIVE
Spec Grav, UA: 1.015 (ref 1.010–1.025)
Urobilinogen, UA: 0.2 U/dL
pH, UA: 6 (ref 5.0–8.0)

## 2024-05-13 NOTE — Progress Notes (Signed)
 Subjective:    Patient ID: Emily Braun, female    DOB: 10-29-1975, 48 y.o.   MRN: [id number]  HPI Discussed the use of AI scribe software for clinical note transcription with the patient, who gave verbal consent to proceed.  History of Present Illness Emily Braun is a 48 year old female who presents for follow-up of chronic health issues.  She is managing her anxiety with Celexa  40 mg and has started therapy sessions, with her second visit last Thursday. She experienced a challenging day on Thursday, but reported that therapy helped a little.  She was seen on August 21st for [facility]uria and [facility] and was [facility]. Despite treatment, she still experiences occasional odor and dark urine.   She has since resumed semaglutide  and insulin . Early satiety and occasional nausea have resolved. Denies any episodes of hypoglycemia. Appetite has improved.   She is using Loloestrin for birth control and has noticed some light spotting. She wears a liner due to urinary issues and has noticed spotting when wiping.  She has been prescribed a Dexcom for glucose monitoring but has not picked it up due to cost concerns.   She has IBS with frequent diarrhea, especially when stressed. No constipation, abdominal pain, nausea, or vomiting. She takes a daily multivitamin.    Review of Systems  Constitutional:  Positive for fatigue.  Respiratory:  Negative for cough, chest tightness, shortness of breath and wheezing.   Cardiovascular:  Negative for chest pain.  Gastrointestinal:  Positive for diarrhea. Negative for abdominal pain, blood in stool, constipation, nausea and vomiting.  Genitourinary:  Positive for vaginal bleeding. Negative for dysuria, flank pain, frequency, hematuria, pelvic pain, urgency and vaginal discharge.       Occasional light break through bleeding on her current pill.        Objective:   Physical Exam Vitals and nursing note  reviewed.  Constitutional:      General: She is not in acute distress. Cardiovascular:     Rate and Rhythm: Normal rate and regular rhythm.  Pulmonary:     Effort: Pulmonary effort is normal.     Breath sounds: Normal breath sounds.  Abdominal:     General: There is no distension.     Palpations: Abdomen is soft.     Tenderness: There is no abdominal tenderness.  Neurological:     Mental Status: She is alert and oriented to person, place, and time.  Psychiatric:        Mood and Affect: Mood normal.        Behavior: Behavior normal.        Thought Content: Thought content normal.        Judgment: Judgment normal.     Comments: Calm, cheerful affect. Making good eye contact. Speech clear.     Today's Vitals   05/13/24 1050  BP: 133/84  Pulse: 60  Weight: 166 lb (75.3 kg)  Height: 5' 2 (1.575 m)   Body mass index is 30.36 kg/m.   Results for orders placed or performed in visit on 05/13/24  POCT urinalysis dipstick   Collection Time: 05/13/24 10:58 AM  Result Value Ref Range   Color, UA amber    Clarity, UA     Glucose, UA Negative Negative   Bilirubin, UA negative    Ketones, UA negative    Spec Grav, UA 1.015 1.010 - 1.025   Blood, UA trace    pH, UA 6.0 5.0 -  8.0   Protein, UA Negative Negative   Urobilinogen, UA 0.2 0.2 or 1.0 E.U./dL   Nitrite, UA negative    Leukocytes, UA Negative Negative   Appearance     Odor           Assessment & Plan:  1. Dark urine  - POCT urinalysis dipstick; trace blood most likely related to BTB. Otherwise normal.   2. Type 2 diabetes mellitus without complication, with long-term current use of insulin  (HCC) (Primary) Resumed insulin  and semaglutide  without further gastrointestinal issues. Continuous glucose monitoring needed due to recent fluctuations. - Attempt to obtain Dexcom 7 with a voucher for cost reduction. Check website. - Keep sugary items like Lifesavers on hand for potential hypoglycemia. - Monitor blood sugar  levels closely.  3. Anxiety - Continue Citalopram  40 mg daily. - Continue counseling sessions.   Return in about 3 months (around 08/12/2024).

## 2024-05-13 NOTE — Progress Notes (Deleted)
 Note:

## 2024-05-31 ENCOUNTER — Ambulatory Visit: Admitting: Family Medicine

## 2024-06-02 ENCOUNTER — Other Ambulatory Visit: Payer: Self-pay | Admitting: Family Medicine

## 2024-06-11 ENCOUNTER — Ambulatory Visit: Admitting: Neurology

## 2024-07-06 ENCOUNTER — Other Ambulatory Visit: Payer: Self-pay | Admitting: Family Medicine

## 2024-07-29 ENCOUNTER — Other Ambulatory Visit: Payer: Self-pay | Admitting: Family Medicine

## 2024-08-16 ENCOUNTER — Ambulatory Visit: Admitting: Nurse Practitioner

## 2024-08-16 VITALS — BP 130/81 | HR 67 | Temp 98.1°F | Ht 62.0 in | Wt 169.0 lb

## 2024-08-16 DIAGNOSIS — F419 Anxiety disorder, unspecified: Secondary | ICD-10-CM

## 2024-08-16 DIAGNOSIS — E119 Type 2 diabetes mellitus without complications: Secondary | ICD-10-CM | POA: Diagnosis not present

## 2024-08-16 DIAGNOSIS — E1169 Type 2 diabetes mellitus with other specified complication: Secondary | ICD-10-CM

## 2024-08-16 DIAGNOSIS — K219 Gastro-esophageal reflux disease without esophagitis: Secondary | ICD-10-CM | POA: Diagnosis not present

## 2024-08-16 DIAGNOSIS — I10 Essential (primary) hypertension: Secondary | ICD-10-CM | POA: Diagnosis not present

## 2024-08-16 DIAGNOSIS — N3946 Mixed incontinence: Secondary | ICD-10-CM | POA: Diagnosis not present

## 2024-08-16 DIAGNOSIS — Z794 Long term (current) use of insulin: Secondary | ICD-10-CM

## 2024-08-16 DIAGNOSIS — E785 Hyperlipidemia, unspecified: Secondary | ICD-10-CM

## 2024-08-16 DIAGNOSIS — R5383 Other fatigue: Secondary | ICD-10-CM

## 2024-08-16 MED ORDER — PANTOPRAZOLE SODIUM 40 MG PO TBEC: DELAYED_RELEASE_TABLET | ORAL | 1 refills | Status: AC

## 2024-08-16 MED ORDER — OXYBUTYNIN CHLORIDE 5 MG PO TABS: 5.0000 mg | ORAL_TABLET | Freq: Two times a day (BID) | ORAL | 1 refills | Status: AC

## 2024-08-16 MED ORDER — CITALOPRAM HYDROBROMIDE 40 MG PO TABS: 40.0000 mg | ORAL_TABLET | Freq: Every day | ORAL | 1 refills | Status: AC

## 2024-08-16 NOTE — Progress Notes (Unsigned)
 "  Subjective:    Patient ID: Emily Braun, female    DOB: [birth year], 48 y.o.   MRN: [id number]  HPI 3 month follow up Asks if pcp could refill oxybutynin  Discussed the use of AI scribe software for clinical note transcription with the patient, who gave verbal consent to proceed.  History of Present Illness SAFIRA PROFFIT is a 48 year old female who presents for medication refills and management of urinary incontinence.  She has been experiencing urinary leakage, which she manages with oxybutynin . The medication has been effective, but her symptoms have slightly worsened over the past year or two. She experiences occasional leakage, especially when her bladder is full and she coughs or sneezes. She is satisfied with her current dose of oxybutynin  and is hesitant to increase it due to potential side effects such as dry mouth and dry eyes. She reports that her urologist advised against bladder tacking due to concerns that she might not be able to urinate after the procedure.  She manages gastroesophageal reflux disease (GERD) with daily pantoprazole , which effectively controls her reflux as long as she avoids certain foods, particularly those that are tomato-based.  Her medication regimen includes pravastatin , which she takes half a pill twice a week for cholesterol management. She also uses escitalopram for mental health support, which she finds effective. She has a prescription for Klonopin  but uses it infrequently. She is on Lantus  Solostar, taking 20 units of insulin , and her diabetes management is reportedly going well with her last A1c being very good. She is also on semaglutide  1 mg for diabetes, which she tolerates well.  She has a history of irritable bowel syndrome (IBS) with occasional diarrhea but no constipation. No current issues with diarrhea or constipation.   She experiences no burning or pain in her feet.    Review of Systems  Constitutional:  Positive for fatigue.   HENT:  Negative for sore throat and trouble swallowing.   Respiratory:  Negative for cough, chest tightness, shortness of breath and wheezing.   Cardiovascular:  Negative for chest pain and leg swelling.  Gastrointestinal:  Negative for constipation and diarrhea.  Genitourinary:  Positive for enuresis.      08/16/2024    1:20 PM  Depression screen PHQ 2/9  Decreased Interest 0  Down, Depressed, Hopeless 0  PHQ - 2 Score 0  Altered sleeping 0  Tired, decreased energy 0  Change in appetite 0  Feeling bad or failure about yourself  0  Trouble concentrating 0  Moving slowly or fidgety/restless 0  Suicidal thoughts 0  PHQ-9 Score 0  Difficult doing work/chores Not difficult at all      08/16/2024    1:20 PM 04/02/2024    3:34 PM 03/12/2024   11:32 AM 03/04/2024    3:53 PM  GAD 7 : Generalized Anxiety Score  Nervous, Anxious, on Edge 0 3 3 3   Control/stop worrying 0 1 3 3   Worry too much - different things 1 1 1 3   Trouble relaxing 1 1 1  0  Restless 0 1 1 0  Easily annoyed or irritable 0 1 0 1  Afraid - awful might happen 0 1 0 3  Total GAD 7 Score 2 9 9 13   Anxiety Difficulty Not difficult at all Somewhat difficult Somewhat difficult Somewhat difficult    Social History[1]      Objective:   Physical Exam NAD.  Alert, oriented.  Calm cheerful affect.  Making good eye contact.  Speech  clear.  Lungs clear.  Heart regular rate rhythm.  Abdomen soft nondistended nontender. Today's Vitals   08/16/24 1317 08/16/24 1354  BP: (!) 148/83 130/81  Pulse: 67   Temp: 98.1 F (36.7 C)   SpO2: 100%   Weight: 169 lb (76.7 kg)   Height: 5' 2 (1.575 m)    Body mass index is 30.91 kg/m.  Diabetic Foot Exam - Simple   Simple Foot Form  08/16/2024  1:10 PM  Visual Inspection No deformities, no ulcerations, no other skin breakdown bilaterally: Yes Sensation Testing Intact to touch and monofilament testing bilaterally: Yes Pulse Check Posterior Tibialis and Dorsalis pulse intact  bilaterally: Yes Comments Significant callus formation on various parts of both feet. Patient states it responds well to her home treatments but she has not had the time lately.         Assessment & Plan:  1. Essential hypertension, benign (Primary) - Comprehensive metabolic panel with GFR - Lipid panel - Microalbumin / creatinine urine ratio  2. Gastroesophageal reflux disease without esophagitis Well-managed with pantoprazole . Symptoms controlled with dietary modifications. - Refilled pantoprazole . - pantoprazole  (PROTONIX ) 40 MG tablet; TAKE 1 TABLET BY MOUTH ONCE DAILY FOR  ACID  REFLUX  Dispense: 90 tablet; Refill: 1  3. Type 2 diabetes mellitus without complication, with long-term current use of insulin  (HCC) Well-controlled with A1c below 6. Insurance limits semaglutide  dosage increase. - Continue current semaglutide  dosage. - Ordered routine yearly labs, including A1c, for January. - Comprehensive metabolic panel with GFR - Hemoglobin A1c - Lipid panel - Microalbumin / creatinine urine ratio  4. Hyperlipidemia associated with type 2 diabetes mellitus (HCC)  - Lipid panel  5. Mixed stress and urge urinary incontinence Managed with oxybutynin . Discussed side effects and pelvic floor exercises. No pelvic organ prolapse previously detected. - Refilled oxybutynin  for 90 days. - Check for pelvic organ prolapse during next physical exam. - oxybutynin  (DITROPAN ) 5 MG tablet; Take 1 tablet (5 mg total) by mouth 2 (two) times daily.  Dispense: 180 tablet; Refill: 1  6. Anxiety Continue current regimen.  - citalopram  (CELEXA ) 40 MG tablet; Take 1 tablet (40 mg total) by mouth daily.  Dispense: 90 tablet; Refill: 1  7. Fatigue, unspecified type  - CBC with Differential/Platelet - Comprehensive metabolic panel with GFR  Return in about 6 months (around 02/14/2025). Recommend physical with labs early next year.        [1]  Social History Tobacco Use   Smoking status:  Never   Smokeless tobacco: Never  Vaping Use   Vaping status: Never Used  Substance Use Topics   Alcohol use: Not Currently    Comment: socially   Drug use: Never   "

## 2024-08-18 ENCOUNTER — Encounter: Payer: Self-pay | Admitting: Nurse Practitioner

## 2024-08-30 ENCOUNTER — Other Ambulatory Visit: Payer: Self-pay | Admitting: Family Medicine

## 2024-09-01 ENCOUNTER — Other Ambulatory Visit: Payer: Self-pay | Admitting: Nurse Practitioner

## 2024-09-01 ENCOUNTER — Other Ambulatory Visit: Payer: Self-pay | Admitting: Family Medicine

## 2024-09-03 ENCOUNTER — Other Ambulatory Visit: Payer: Self-pay | Admitting: Nurse Practitioner

## 2024-10-28 ENCOUNTER — Ambulatory Visit: Admitting: Neurology

## 2025-02-14 ENCOUNTER — Ambulatory Visit: Admitting: Nurse Practitioner
